# Patient Record
Sex: Male | Born: 1975 | Race: White | Hispanic: No | Marital: Married | Smoking: Never smoker
Health system: Southern US, Community
[De-identification: ages and names within clinical notes are randomized; demographics above are authoritative.]

## PROBLEM LIST (undated history)

## (undated) DIAGNOSIS — R51 Headache: Secondary | ICD-10-CM

## (undated) DIAGNOSIS — E669 Obesity, unspecified: Secondary | ICD-10-CM

## (undated) DIAGNOSIS — I10 Essential (primary) hypertension: Secondary | ICD-10-CM

## (undated) DIAGNOSIS — M6740 Ganglion, unspecified site: Secondary | ICD-10-CM

## (undated) DIAGNOSIS — F419 Anxiety disorder, unspecified: Secondary | ICD-10-CM

## (undated) DIAGNOSIS — K819 Cholecystitis, unspecified: Secondary | ICD-10-CM

## (undated) DIAGNOSIS — F329 Major depressive disorder, single episode, unspecified: Secondary | ICD-10-CM

## (undated) DIAGNOSIS — F32A Depression, unspecified: Secondary | ICD-10-CM

## (undated) DIAGNOSIS — E119 Type 2 diabetes mellitus without complications: Secondary | ICD-10-CM

## (undated) DIAGNOSIS — K760 Fatty (change of) liver, not elsewhere classified: Secondary | ICD-10-CM

## (undated) HISTORY — DX: Fatty (change of) liver, not elsewhere classified: K76.0

## (undated) HISTORY — DX: Anxiety disorder, unspecified: F41.9

## (undated) HISTORY — PX: VSD REPAIR: SHX276

## (undated) HISTORY — DX: Ganglion, unspecified site: M67.40

## (undated) HISTORY — DX: Essential (primary) hypertension: I10

## (undated) HISTORY — DX: Depression, unspecified: F32.A

## (undated) HISTORY — DX: Major depressive disorder, single episode, unspecified: F32.9

## (undated) HISTORY — PX: SPINE SURGERY: SHX786

## (undated) HISTORY — DX: Obesity, unspecified: E66.9

## (undated) HISTORY — PX: LUMBAR LAMINECTOMY: SHX95

## (undated) HISTORY — DX: Cholecystitis, unspecified: K81.9

## (undated) HISTORY — DX: Type 2 diabetes mellitus without complications: E11.9

---

## 2007-07-29 ENCOUNTER — Ambulatory Visit: Payer: Self-pay | Admitting: Internal Medicine

## 2007-07-29 DIAGNOSIS — M6740 Ganglion, unspecified site: Secondary | ICD-10-CM

## 2007-07-29 HISTORY — DX: Ganglion, unspecified site: M67.40

## 2007-12-24 ENCOUNTER — Ambulatory Visit: Payer: Self-pay | Admitting: Internal Medicine

## 2007-12-24 DIAGNOSIS — E669 Obesity, unspecified: Secondary | ICD-10-CM

## 2007-12-24 DIAGNOSIS — E119 Type 2 diabetes mellitus without complications: Secondary | ICD-10-CM

## 2007-12-24 HISTORY — DX: Obesity, unspecified: E66.9

## 2007-12-24 HISTORY — DX: Type 2 diabetes mellitus without complications: E11.9

## 2007-12-24 LAB — CONVERTED CEMR LAB
ALT: 46 units/L (ref 0–53)
AST: 22 units/L (ref 0–37)
Albumin: 3.4 g/dL — ABNORMAL LOW (ref 3.5–5.2)
Alkaline Phosphatase: 103 units/L (ref 39–117)
BUN: 12 mg/dL (ref 6–23)
Basophils Absolute: 0.1 10*3/uL (ref 0.0–0.1)
Basophils Relative: 1 % (ref 0.0–1.0)
Bilirubin, Direct: 0.1 mg/dL (ref 0.0–0.3)
Blood Glucose, Fingerstick: 239
CO2: 30 meq/L (ref 19–32)
Calcium: 9.9 mg/dL (ref 8.4–10.5)
Chloride: 101 meq/L (ref 96–112)
Creatinine, Ser: 0.9 mg/dL (ref 0.4–1.5)
Eosinophils Absolute: 0.2 10*3/uL (ref 0.0–0.7)
Eosinophils Relative: 2.1 % (ref 0.0–5.0)
GFR calc Af Amer: 124 mL/min
GFR calc non Af Amer: 103 mL/min
Glucose, Bld: 291 mg/dL — ABNORMAL HIGH (ref 70–99)
HCT: 49.5 % (ref 39.0–52.0)
Hemoglobin: 16.6 g/dL (ref 13.0–17.0)
Hgb A1c MFr Bld: 11.2 % — ABNORMAL HIGH (ref 4.6–6.0)
Lymphocytes Relative: 26.8 % (ref 12.0–46.0)
MCHC: 33.5 g/dL (ref 30.0–36.0)
MCV: 84.9 fL (ref 78.0–100.0)
Monocytes Absolute: 0.6 10*3/uL (ref 0.1–1.0)
Monocytes Relative: 6.6 % (ref 3.0–12.0)
Neutro Abs: 5.3 10*3/uL (ref 1.4–7.7)
Neutrophils Relative %: 63.5 % (ref 43.0–77.0)
Platelets: 251 10*3/uL (ref 150–400)
Potassium: 4.6 meq/L (ref 3.5–5.1)
RBC: 5.83 M/uL — ABNORMAL HIGH (ref 4.22–5.81)
RDW: 11.8 % (ref 11.5–14.6)
Sodium: 138 meq/L (ref 135–145)
TSH: 1.94 microintl units/mL (ref 0.35–5.50)
Total Bilirubin: 1 mg/dL (ref 0.3–1.2)
Total Protein: 6.9 g/dL (ref 6.0–8.3)
WBC: 8.5 10*3/uL (ref 4.5–10.5)

## 2008-01-07 ENCOUNTER — Ambulatory Visit: Payer: Self-pay | Admitting: Internal Medicine

## 2009-04-19 ENCOUNTER — Telehealth: Payer: Self-pay | Admitting: Internal Medicine

## 2009-07-12 ENCOUNTER — Ambulatory Visit: Payer: Self-pay | Admitting: Internal Medicine

## 2009-07-12 LAB — CONVERTED CEMR LAB
ALT: 27 units/L (ref 0–53)
AST: 19 units/L (ref 0–37)
Albumin: 3.8 g/dL (ref 3.5–5.2)
Alkaline Phosphatase: 71 units/L (ref 39–117)
BUN: 13 mg/dL (ref 6–23)
Basophils Absolute: 0.1 10*3/uL (ref 0.0–0.1)
Basophils Relative: 0.9 % (ref 0.0–3.0)
Bilirubin Urine: NEGATIVE
Bilirubin, Direct: 0.1 mg/dL (ref 0.0–0.3)
CO2: 32 meq/L (ref 19–32)
Calcium: 9 mg/dL (ref 8.4–10.5)
Chloride: 102 meq/L (ref 96–112)
Cholesterol: 203 mg/dL — ABNORMAL HIGH (ref 0–200)
Creatinine, Ser: 0.9 mg/dL (ref 0.4–1.5)
Direct LDL: 140.6 mg/dL
Eosinophils Absolute: 0.2 10*3/uL (ref 0.0–0.7)
Eosinophils Relative: 2.9 % (ref 0.0–5.0)
GFR calc non Af Amer: 101.7 mL/min (ref >60)
Glucose, Bld: 158 mg/dL — ABNORMAL HIGH (ref 70–99)
HCT: 45.1 % (ref 39.0–52.0)
HDL: 25 mg/dL — ABNORMAL LOW (ref >39.00)
Hemoglobin, Urine: NEGATIVE
Hemoglobin: 15.3 g/dL (ref 13.0–17.0)
Ketones, ur: NEGATIVE mg/dL
Leukocytes, UA: NEGATIVE
Lymphocytes Relative: 24.1 % (ref 12.0–46.0)
Lymphs Abs: 2 10*3/uL (ref 0.7–4.0)
MCHC: 33.9 g/dL (ref 30.0–36.0)
MCV: 87.5 fL (ref 78.0–100.0)
Monocytes Absolute: 0.6 10*3/uL (ref 0.1–1.0)
Monocytes Relative: 7.3 % (ref 3.0–12.0)
Neutro Abs: 5.4 10*3/uL (ref 1.4–7.7)
Neutrophils Relative %: 64.8 % (ref 43.0–77.0)
Nitrite: NEGATIVE
Platelets: 255 10*3/uL (ref 150.0–400.0)
Potassium: 4.4 meq/L (ref 3.5–5.1)
RBC: 5.15 M/uL (ref 4.22–5.81)
RDW: 12.3 % (ref 11.5–14.6)
Sodium: 141 meq/L (ref 135–145)
Specific Gravity, Urine: 1.02 (ref 1.000–1.030)
TSH: 2.18 microintl units/mL (ref 0.35–5.50)
Total Bilirubin: 0.7 mg/dL (ref 0.3–1.2)
Total CHOL/HDL Ratio: 8
Total Protein, Urine: NEGATIVE mg/dL
Total Protein: 7.1 g/dL (ref 6.0–8.3)
Triglycerides: 192 mg/dL — ABNORMAL HIGH (ref 0.0–149.0)
Urine Glucose: NEGATIVE mg/dL
Urobilinogen, UA: 0.2 (ref 0.0–1.0)
VLDL: 38.4 mg/dL (ref 0.0–40.0)
WBC: 8.3 10*3/uL (ref 4.5–10.5)
pH: 7 (ref 5.0–8.0)

## 2009-07-21 ENCOUNTER — Ambulatory Visit: Payer: Self-pay | Admitting: Internal Medicine

## 2009-07-21 LAB — CONVERTED CEMR LAB: Hgb A1c MFr Bld: 7.3 % — ABNORMAL HIGH (ref 4.6–6.5)

## 2009-07-21 LAB — HM DIABETES FOOT EXAM

## 2009-10-12 ENCOUNTER — Emergency Department (HOSPITAL_COMMUNITY): Admission: EM | Admit: 2009-10-12 | Discharge: 2009-10-12 | Payer: Self-pay | Admitting: Emergency Medicine

## 2010-09-21 ENCOUNTER — Ambulatory Visit: Payer: Self-pay | Admitting: Internal Medicine

## 2010-09-21 LAB — HM DIABETES EYE EXAM: HM Diabetic Eye Exam: NORMAL

## 2010-10-24 NOTE — Letter (Signed)
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   [place], [place] [address]   Phone: [phone]  Fax: [phone]    December 24, 2007   Employee:  Donia Guiles    To Whom It May Concern:   For Medical reasons, please excuse the above named employee from work for the following dates:  Start:   4-1  End:   4-1  If you need additional information, please feel free to contact our office. Mr. Porreca may return to work without restrictions.         Sincerely,    Gordy Savers  MD

## 2010-10-24 NOTE — Assessment & Plan Note (Signed)
Summary: CPX/CJR   Vital Signs:  Patient profile:   35 year old male Weight:      240 pounds BMI:     36.62 Pulse rate:   84 / minute Pulse rhythm:   regular BP sitting:   112 / 80  (left arm) Cuff size:   large  Vitals Entered By: Raechel Ache, RN (July 21, 2009 2:31 PM) CC: CPX, labs done. Is Patient Diabetic? Yes   CC:  CPX and labs done.Derrick West  History of Present Illness: 35 year-old patient who has a history of type 2 diabetes, who is seen today for a wellness exam.  he has had prior open heart surgery as a child presumably for closure of a VSD.  He is doing well today.  He does monitor blood sugars at home with fairly normal readings.  He has not been seen here in follow-up in some time.  He states that he has annual exams at work, including EKG s  Preventive Screening-Counseling & Management  Alcohol-Tobacco     Smoking Status: never  Caffeine-Diet-Exercise     Does Patient Exercise: no  Problems Prior to Update: 1)  Obesity  (ICD-278.00) 2)  Diabetes Mellitus, Type II  (ICD-250.00) 3)  Ganglion Cyst, Wrist, Left  (ICD-727.41)  Medications Prior to Update: 1)  Aleve 220 Mg  Tabs (Naproxen Sodium) .... Prn 2)  Metformin Hcl 1000 Mg  Tabs (Metformin Hcl) .... One Twice Daily 3)  Freestyle Lite   Strp (Glucose Blood) .... Use Daily  Allergies: 1)  ! Amoxicillin (Amoxicillin)  Past History:  Past Medical History: patient had open-heart surgery in 1979  for unclear congenital heart disease (VSD?) Diabetes mellitus, type II   initial diagnosis of April 2009 Obesity  Past Surgical History: Lumbar laminectomy 2003 Open heart surgery for repair of VSD  Family History: Reviewed history from 07/29/2007 and no changes required. father age 17, history of coronary artery disease, hypertension mother, age 57, history of asthma Two brothers in good health Family History of Asthma Family History of CAD Male 1st degree relative <60 Family History Hypertension   Social History: Reviewed history and no changes required. Married one daughter 52 months Never Smoked [employer] rotating shifts Regular exercise-no Smoking Status:  never Does Patient Exercise:  no  Review of Systems  The patient denies anorexia, fever, weight loss, weight gain, vision loss, decreased hearing, hoarseness, chest pain, syncope, dyspnea on exertion, peripheral edema, prolonged cough, headaches, hemoptysis, abdominal pain, melena, hematochezia, severe indigestion/heartburn, hematuria, incontinence, genital sores, muscle weakness, suspicious skin lesions, transient blindness, difficulty walking, depression, unusual weight change, abnormal bleeding, enlarged lymph nodes, angioedema, breast masses, and testicular masses.    Physical Exam  General:  overweight-appearing.  normal blood pressureoverweight-appearing.   Head:  Normocephalic and atraumatic without obvious abnormalities. No apparent alopecia or balding. Eyes:  No corneal or conjunctival inflammation noted. EOMI. Perrla. Funduscopic exam benign, without hemorrhages, exudates or papilledema. Vision grossly normal. Ears:  External ear exam shows no significant lesions or deformities.  Otoscopic examination reveals clear canals, tympanic membranes are intact bilaterally without bulging, retraction, inflammation or discharge. Hearing is grossly normal bilaterally. Mouth:  Oral mucosa and oropharynx without lesions or exudates.  Teeth in good repair. Neck:  No deformities, masses, or tenderness noted. Chest Wall:  No deformities, masses, tenderness or gynecomastia noted. lower transverse thoracotomy scar Lungs:  Normal respiratory effort, chest expands symmetrically. Lungs are clear to auscultation, no crackles or wheezes. Heart:  Normal rate and regular rhythm.  S1 and S2 normal without gallop, murmur, click, rub or other extra sounds. Abdomen:  Bowel sounds positive,abdomen soft and non-tender without masses, organomegaly or  hernias noted. Genitalia:  Testes bilaterally descended without nodularity, tenderness or masses. No scrotal masses or lesions. No penis lesions or urethral discharge. Msk:  No deformity or scoliosis noted of thoracic or lumbar spine.   Pulses:  R and L carotid,radial,femoral,dorsalis pedis and posterior tibial pulses are full and equal bilaterally  Diabetes Management Exam:    Foot Exam (with socks and/or shoes not present):       Sensory-Pinprick/Light touch:          Left medial foot (L-4): normal          Left dorsal foot (L-5): normal          Left lateral foot (S-1): normal          Right medial foot (L-4): normal          Right dorsal foot (L-5): normal          Right lateral foot (S-1): normal       Sensory-Monofilament:          Left foot: normal          Right foot: normal       Inspection:          Left foot: normal          Right foot: normal       Nails:          Left foot: normal          Right foot: normal    Foot Exam by Podiatrist:       Date: 07/21/2009       Results: no diabetic findings       Done by: PCP    Eye Exam:       Eye Exam done here today          Results: normal   Impression & Recommendations:  Problem # 1:  Preventive Health Care (ICD-V70.0)  Complete Medication List: 1)  Aleve 220 Mg Tabs (Naproxen sodium) .... Prn 2)  Metformin Hcl 1000 Mg Tabs (Metformin hcl) .... One twice daily 3)  Freestyle Lite Strp (Glucose blood) .... Use daily  Other Orders: Venipuncture ([address]) TLB-A1C / Hgb A1C (Glycohemoglobin) (83036-A1C)  Patient Instructions: 1)  Please schedule a follow-up appointment in 3 months. 2)  It is important that you exercise regularly at least 20 minutes 5 times a week. If you develop chest pain, have severe difficulty breathing, or feel very tired , stop exercising immediately and seek medical attention. 3)  You need to lose weight. Consider a lower calorie diet and regular exercise.  4)  Check your blood sugars regularly. If  your readings are usually above : or below 70 you should contact our office. 5)  It is important that your Diabetic A1c level is checked every 3 months. 6)  See your eye doctor yearly to check for diabetic eye damage. Prescriptions: FREESTYLE LITE   STRP (GLUCOSE BLOOD) use daily  #180 Each x 5   Entered and Authorized by:   Gordy Savers  MD   Signed by:   Gordy Savers  MD on 07/21/2009   Method used:   Print then Give to Patient   RxID:   [id number] METFORMIN HCL 1000 MG  TABS (METFORMIN HCL) one twice daily  #180 Each x 4   Entered and Authorized by:  Gordy Savers  MD   Signed by:   Gordy Savers  MD on 07/21/2009   Method used:   Print then Give to Patient   RxID:   [id number]

## 2010-10-26 NOTE — Assessment & Plan Note (Signed)
Summary: med check/refill/cjr   Vital Signs:  Patient profile:   35 year old male Weight:      246 pounds Temp:     98.0 degrees F oral BP sitting:   122 / 84  (right arm) Cuff size:   regular  Vitals Entered By: Duard Brady LPN (September 21, 2010 8:27 AM) CC: med review with refill     fbs Is Patient Diabetic? Yes Did you bring your meter with you today? No   CC:  med review with refill     fbs.  History of Present Illness: 35 year old patient who has a history of type 2 diabetes.  He has a history also of exogenous obesity.  He has not been seen here in 14 months.  Over this period time, there has been  a 6-pound weight gain.  For the past 6 months.  Blood sugars have been consistently running over 200.  He feels well without hyperglycemic symptoms.  He has relocated to [place].  He states that he takes his medications inconsistently.  He has relocated to [place].  Allergies: 1)  ! Amoxicillin (Amoxicillin)  Past History:  Past Medical History: Reviewed history from 07/21/2009 and no changes required. patient had open-heart surgery in 1979  for unclear congenital heart disease (VSD?) Diabetes mellitus, type II   initial diagnosis of April 2009 Obesity  Family History: Reviewed history from 07/21/2009 and no changes required. father age 35, history of coronary artery disease, hypertension mother, age 25, history of asthma Two brothers in good health Family History of Asthma Family History of CAD Male 1st degree relative <60 Family History Hypertension  Social History: Reviewed history from 07/21/2009 and no changes required. Married one daughter 73 months Never Smoked [employer] rotating shifts Regular exercise-no  Review of Systems       The patient complains of weight gain.  The patient denies anorexia, fever, weight loss, vision loss, decreased hearing, hoarseness, chest pain, syncope, dyspnea on exertion, peripheral edema, prolonged cough, headaches,  hemoptysis, abdominal pain, melena, hematochezia, severe indigestion/heartburn, hematuria, incontinence, genital sores, muscle weakness, suspicious skin lesions, transient blindness, difficulty walking, depression, unusual weight change, abnormal bleeding, enlarged lymph nodes, angioedema, breast masses, and testicular masses.    Physical Exam  General:  overweight-appearing.  130/90overweight-appearing.   Head:  Normocephalic and atraumatic without obvious abnormalities. No apparent alopecia or balding. Eyes:  No corneal or conjunctival inflammation noted. EOMI. Perrla. Funduscopic exam benign, without hemorrhages, exudates or papilledema. Vision grossly normal. Mouth:  Oral mucosa and oropharynx without lesions or exudates.  Teeth in good repair. Neck:  No deformities, masses, or tenderness noted. Lungs:  Normal respiratory effort, chest expands symmetrically. Lungs are clear to auscultation, no crackles or wheezes. Heart:  Normal rate and regular rhythm. S1 and S2 normal without gallop, murmur, click, rub or other extra sounds. Abdomen:  Bowel sounds positive,abdomen soft and non-tender without masses, organomegaly or hernias noted. Msk:  No deformity or scoliosis noted of thoracic or lumbar spine.   Pulses:  R and L carotid,radial,femoral,dorsalis pedis and posterior tibial pulses are full and equal bilaterally Extremities:  No clubbing, cyanosis, edema, or deformity noted with normal full range of motion of all joints.   Skin:  Intact without suspicious lesions or rashes  Diabetes Management Exam:    Eye Exam:       Eye Exam done here today          Results: normal   Impression & Recommendations:  Problem # 1:  DIABETES  MELLITUS, TYPE II (ICD-250.00)  His updated medication list for this problem includes:    Metformin Hcl 1000 Mg Tabs (Metformin hcl) ..... One twice daily    Glimepiride 4 Mg Tabs (Glimepiride) .Marland Kitchen... 1/2 tablet daily  His updated medication list for this problem  includes:    Metformin Hcl 1000 Mg Tabs (Metformin hcl) ..... One twice daily    Glimepiride 4 Mg Tabs (Glimepiride) .Marland Kitchen... 1/2 tablet daily  Problem # 2:  OBESITY (ICD-278.00)  Complete Medication List: 1)  Aleve 220 Mg Tabs (Naproxen sodium) .... Prn 2)  Metformin Hcl 1000 Mg Tabs (Metformin hcl) .... One twice daily 3)  Freestyle Lite Strp (Glucose blood) .... Use daily 4)  Glimepiride 4 Mg Tabs (Glimepiride) .... 1/2 tablet daily  Patient Instructions: 1)  Please schedule a follow-up appointment in 2 months. 2)  Limit your Sodium (Salt). 3)  It is important that you exercise regularly at least 20 minutes 5 times a week. If you develop chest pain, have severe difficulty breathing, or feel very tired , stop exercising immediately and seek medical attention. 4)  You need to lose weight. Consider a lower calorie diet and regular exercise.  5)  Check your blood sugars regularly. If your readings are usually above : or below 70 you should contact our office. 6)  It is important that your Diabetic A1c level is checked every 3 months. 7)  See your eye doctor yearly to check for diabetic eye damage. Prescriptions: GLIMEPIRIDE 4 MG TABS (GLIMEPIRIDE) 1/2 tablet daily  #90 x 6   Entered and Authorized by:   Gordy Savers  MD   Signed by:   Gordy Savers  MD on 09/21/2010   Method used:   Electronically to        [facility]  [address] (retail)       [address]       [place], [place]  [address]       Ph: [phone]       Fax: [phone]   RxID:   [id number] FREESTYLE LITE   STRP (GLUCOSE BLOOD) use daily  #180 Each x 5   Entered and Authorized by:   Gordy Savers  MD   Signed by:   Gordy Savers  MD on 09/21/2010   Method used:   Electronically to        [facility]  [address] (retail)       [address]       [place], [place]  [address]       Ph: [phone]       Fax: [phone]   RxID:   [id number] METFORMIN  HCL 1000 MG  TABS (METFORMIN HCL) one twice daily  #180 Each x 4   Entered and Authorized by:   Gordy Savers  MD   Signed by:   Gordy Savers  MD on 09/21/2010   Method used:   Electronically to        [facility]  [address] (retail)       [address]       [place], [place]  [address]       Ph: [phone]       Fax: [phone]   RxID:   [id number]    Orders Added: 1)  Est. Patient Level IV [[address]]

## 2010-11-15 ENCOUNTER — Encounter: Payer: Self-pay | Admitting: Internal Medicine

## 2010-11-16 ENCOUNTER — Ambulatory Visit (INDEPENDENT_AMBULATORY_CARE_PROVIDER_SITE_OTHER): Payer: 59 | Admitting: Internal Medicine

## 2010-11-16 ENCOUNTER — Encounter: Payer: Self-pay | Admitting: Internal Medicine

## 2010-11-16 VITALS — BP 120/80 | Temp 98.2°F | Ht 68.5 in | Wt 257.0 lb

## 2010-11-16 DIAGNOSIS — E119 Type 2 diabetes mellitus without complications: Secondary | ICD-10-CM

## 2010-11-16 LAB — HEMOGLOBIN A1C: Hgb A1c MFr Bld: 8.1 % — ABNORMAL HIGH (ref 4.6–6.5)

## 2010-11-16 NOTE — Progress Notes (Signed)
  Subjective:    Patient ID: Derrick West, male    DOB: 1976-09-20, 35 y.o.   MRN: [id number]  HPI   35 year old patient who is in today for followup of his type 2 diabetes. He states his fasting blood sugars are fairly well-controlled is a less than 120. Blood sugars are also well-maintained throughout the day and the often less than 100 in the early afternoon following lunch. He notes some high blood sugars between his evening meal and bedtime often as high as 250. He feels well. He has been unsuccessful at weight loss.   Review of Systems  Constitutional: Negative for fever, chills, appetite change and fatigue.  HENT: Negative for hearing loss, ear pain, congestion, sore throat, trouble swallowing, neck stiffness, dental problem, voice change and tinnitus.   Eyes: Negative for pain, discharge and visual disturbance.  Respiratory: Negative for cough, chest tightness, wheezing and stridor.   Cardiovascular: Negative for chest pain, palpitations and leg swelling.  Gastrointestinal: Negative for nausea, vomiting, abdominal pain, diarrhea, constipation, blood in stool and abdominal distention.  Genitourinary: Negative for urgency, hematuria, flank pain, discharge, difficulty urinating and genital sores.  Musculoskeletal: Negative for myalgias, back pain, joint swelling, arthralgias and gait problem.  Skin: Negative for rash.  Neurological: Negative for dizziness, syncope, speech difficulty, weakness, numbness and headaches.  Hematological: Negative for adenopathy. Does not bruise/bleed easily.  Psychiatric/Behavioral: Negative for behavioral problems and dysphoric mood. The patient is not nervous/anxious.        Objective:   Physical Exam  Constitutional: He is oriented to person, place, and time. He appears well-developed.  HENT:  Head: Normocephalic.  Right Ear: External ear normal.  Left Ear: External ear normal.  Eyes: Conjunctivae and EOM are normal.  Neck: Normal range of motion.    Cardiovascular: Normal rate and normal heart sounds.   Pulmonary/Chest: Breath sounds normal.  Abdominal: Bowel sounds are normal.  Musculoskeletal: Normal range of motion. He exhibits no edema and no tenderness.  Neurological: He is alert and oriented to person, place, and time.  Psychiatric: He has a normal mood and affect. His behavior is normal.          Assessment & Plan:   diabetes mellitus type 2. Nonpharmacologic lifestyle issues discussed at length. Dr. Information dispensed. Will check a hemoglobin A1c today

## 2010-11-16 NOTE — Patient Instructions (Addendum)
It is important that you exercise regularly, at least 20 minutes 3 to 4 times per week.  If you develop chest pain or shortness of breath seek  medical attention.  Please check your hemoglobin A1c every 3 months  You need to lose weight.  Consider a lower calorie diet and regular exercise. Diabetes and Exercise   Regular exercise is important and can help:    Control blood glucose (sugar).    Decrease blood pressure.    Control blood lipids (cholesterol and triglycerides).  Improve overall health.   BENEFITS FROM EXERCISE:  Improved fitness.  Improved flexibility.  Improved endurance.  Increased bone density.  Weight control.  Increased muscle strength.  Decreased body fat.  Improvement of the body's use of a hormone called insulin. l Increased insulin sensitivity. l Reduction of insulin needs.  Helps you feel better.  Reduces stress and tension.   People with diabetes who add exercise to their lifestyle gain additional benefits.    Weight loss.  Reduces appetite.  Improves body's use of blood glucose (sugar).  Decreases risk factors for heart disease: l Lowering of cholesterol and triglycerides. l Raising the level of good cholesterol (high-density lipoproteins [HDL]). l Lowering blood sugar.  Decreases blood pressure.   TYPE 1 DIABETES AND EXERCISE  Exercise will usually lower your blood glucose.  If blood glucose is greater than 240 mg/dl, check urine ketones. If ketones are present, do not exercise.  Location of the insulin injection sites may need to be adjusted with exercise. Avoid injecting insulin into areas of the body that will be exercised. For example, avoid injecting insulin into: l The arms when playing tennis. l The legs when jogging. For more information, discuss this with your caregiver.  Keep a record of: l Food intake. l Type and amount of exercise. l Expected peak times of insulin action.   l Blood glucose (sugar) levels. Do  this before, during and after exercise. Review your records with your caregiver(s). This will help you to develop guidelines for adjusting food intake and/or insulin amounts.     TYPE 2 DIABETES AND EXERCISE  Regular physical activity can help control blood glucose.  Exercise is important because it may: l Increase the body's sensitivity to insulin. l Improve blood glucose control.  Exercise reduces the risk of heart disease. It decreases serum cholesterol and triglycerides. It also lowers blood pressure.  Those who take insulin or oral hypoglycemic agents should watch for signs of hypoglycemia. These signs include dizziness, shaking, sweating, chills and confusion.  Body water is lost during exercise. It must be replaced. This will help to avoid loss of body fluids (dehydration) and/or heat stroke.   Be sure to talk to your caregiver before starting an exercise program to make sure it is safe for you. Remember, any activity is better than none.     Document Released: 12/01/2003  Document Re-Released: 07/08/2009 [employer] Patient Information 2011 [place], [place].Diabetic Meal Planning Guide   The diabetic meal planning guide is a tool to help you plan your meals and snacks. It is important for people with diabetes to manage their blood sugar levels. Choosing the right foods and the right amounts throughout your day will help control your blood sugar. Eating right can even help you improve your blood pressure and reach or maintain a healthy weight.   CARBOHYDRATE COUNTING MADE EASY When you eat carbohydrate, it turns to sugar (glucose). This in turn raises your blood sugar level. Counting carbohydrates can help you  control this level so you feel better. When you plan your meals by counting carbohydrates, you can have more flexibility in what you eat and balance your medication with your food intake.   Carbohydrate counting simply means adding up the total amount of carbohydrate grams in your  meals or snacks. Try to eat about the same amount at each meal. Each portion below is one carb choice or 15 grams of carbohydrate. Foods with carbohydrates are listed below. Ask your dietician how many grams of carbohydrate you should eat at each meal or snack.   Grains and Starches       1 slice bread                   English muffin or hotdog/hamburger bun          cup cold cereal (unsweetened)    1/3 cup cooked pasta or rice 1/2 cup starchy vegetables (corn, potatoes, peas, beans, winter squash) 1 tortilla (6 inches)  bagel 1 waffle or pancake (size of a compact disk)  cup cooked cereal 4-6 small crackers   Fruit   1 cup fresh unsweetened berries, melon, papaya, pineapple         1 small fresh fruit 1/2 banana or mango 1/2 cup fruit juice (4 oz unsweetened) 1/2 cup canned fruit in natural juice or water 2 Tbsp dried fruit 12-15 grapes or cherries   Milk and Yogurt      1 cup fat free or 1 % milk 1 cup soy milk 6 oz light yogurt with sugar free sweetener 6 oz low fat soy yogurt 6 oz plain yogurt   Vegetables 1 cup raw or 1/2 cup cooked is counted as zero carbs or a "free" food If you eat 3 or more servings at one meal, count them as 1 carbohydrate serving.   Other Carbohydrate 3/4 oz chips or pretzels 1/2 cup ice cream or frozen yogurt  cup sherbet or sorbet 2 inch square cake, no frosting 1 Tbsp honey, sugar, jam, jelly or syrup 2 small cookies 3 squares of graham crackers 3 cups popcorn 6 crackers 1 cup broth based soup Count 1 cup casserole or other mixed foods as 2 carbohydrate servings. Foods with less than 20 calories in a serving may be counted as zero carbohydrate or a "free" food.   You may want to purchase a book or computer software that lists the carbohydrate gram counts of different foods.  In addition, the Nutrition Facts panel on the labels of the foods you eat are a good source of this information. It will tell you how big the serving size is and  the total number of carbohydrate grams you will be eating per serving. Divide this number by 15 to obtain the number of carbohydrate servings in a portion. Remember:1 carbohydrate serving equals 15 grams of carbohydrate.     SERVING SIZES Measuring foods and serving sizes helps to make sure you are getting the right amount of food. The list below tells how big or small some common serving sizes are.    1 ounce (oz) of cheese.................................4 stacked dice.  2-3 oz cooked meat.....................................Marland KitchenDeck of cards.  1 teaspoon (tsp)...........................................Marland KitchenTip of little finger.  1 tablespoon (Tbsp)....................................Marland KitchenMarland KitchenThumb.  2 Tbsp..........................................................Marland KitchenGolf ball.   Cup..........................................................Marland KitchenHalf of a fist.  1 Cup...........................................................Marland KitchenA fist.   SAMPLE DIABETIC MEAL PLAN: Below is a sample meal plan that includes foods from the grain and starches, dairy, vegetable, fruit, and meat groups. A Registered Dietician can individualize a meal  plan to fit your calorie needs and tell you the number of servings needed from each food group. However, you may interchange carbohydrate containing foods (dairy, starches, and fruits). Controlling the total amount of carbohydrate in your meal or snack is more important than making sure you include all of the food groups at every meal.     This meal plan below is an example of a 2000 calorie diet using carbohydrate counting. This meal plan has 17 carb choices or servings. Breakfast  1 cup oatmeal (2 carb choices)   cup light yogurt (1 carb choice) 1 cup blueberries (1 carb choice)  cup almonds   Snack  1 large apple (2 carb choices)  1 low fat string cheese stick   Lunch  Chicken breast salad  l 1 cup spinach l  cup chopped tomatoes l 2 oz chicken breast, sliced l 2 Tbsp low  fat [place] dressing 12 whole wheat crackers (2 carb choices) 12-15 grapes (1 carb choice) 1 cup low fat milk (1 carb choice)   Snack  1 cup carrots   cup hummus (1 carb choice)   Dinner   3 oz broiled salmon  1 cup brown rice (3 carb choices)   Snack   1  cups steamed broccoli (1 carb choice) drizzled with 1 tsp olive oil and lemon juice  1 cup light pudding (2 carb choices)      DIABETIC MEAL PLANNING WORKSHEET: Your dietician can use this worksheet to help you decide how many servings of foods and what types of foods are right for you.     Breakfast Food Group and Servings               Carb Choices Grain/Starches ________________________________________ Dairy ________________________________________________ Vegetables _______________________________________ Fruits ________________________________________________ Meat ________________________________________________ Fat _____________________________________________   Lunch Food Group and Servings               Carb Choices Grain/Starches ________________________________________ Dairy _______________________________________________ Fruits _______________________________________________ Meat ________________________________________________ Fat _____________________________________________   Dinner Food Group and Servings               Carb Choices Grain/Starches ________________________________________ Dairy ________________________________________________ Fruits _______________________________________________ Meat ________________________________________________ Fat _____________________________________________   Snacks Food Group and Servings               Carb Choices Grain/Starches _________________________________________ Dairy ________________________________________________ Vegetables ________________________________________ Fruits ________________________________________________ Meat  _________________________________________________ Fat ______________________________________________   Daily Totals Starches _________________________  Vegetables _________________________ Fruits _____________________________ Dairy _____________________________ Meat ______________________________ Fats _______________________________       Document Released: 06/07/2005  Document Re-Released: 10/02/2009 [employer] Patient Information 2011 [place], [place].Diabetes, Type 2   Diabetes is a lasting (chronic) disease. It occurs when the body does not properly use the sugar (glucose) that is released from food. Glucose levels are controlled by a hormone called insulin. Insulin is made by your pancreas. In type 2 diabetes, the pancreas is making less insulin and the body has trouble using the insulin properly.   HOME CARE   Check your blood glucose (sugar) once a day.    Follow your treatment and monitoring plan.  Take your medicine as directed by your doctor.    Do not smoke.    Eat healthy. Weight loss can improve your diabetes.    Know what low blood glucose (hypoglycemia) is. Know how to treat it.    Get your eyes checked regularly.  Have a yearly physical exam.    Have your blood pressure checked.    Get your blood and  urine tested.    Wear a bracelet that says you have diabetes.  Check your feet for sores every night. Tell your doctor if sores are not healing.   GET HELP RIGHT AWAY IF YOU:    Have trouble keeping your blood glucose in target range.  Have problems with your medicines.  Are sick and not getting better after 24 hours.  Have a sore or wound that is not healing.  Have a problem or change in your vision.  Develop fever of more than 100.5 F (38.1 C).   Document Released: 06/19/2008  Document Re-Released: 07/08/2009 [facility] Patient Information 2011 [place], [place].

## 2010-11-20 NOTE — Progress Notes (Signed)
Quick Note:  Spoke with pt - informed of lab results and doctor's instructions ______

## 2010-12-11 LAB — BASIC METABOLIC PANEL
BUN: 11 mg/dL (ref 6–23)
CO2: 25 mEq/L (ref 19–32)
Calcium: 9.1 mg/dL (ref 8.4–10.5)
Chloride: 101 mEq/L (ref 96–112)
Creatinine, Ser: 0.73 mg/dL (ref 0.4–1.5)
GFR calc Af Amer: >60 mL/min (ref >60)
GFR calc non Af Amer: >60 mL/min (ref >60)
Glucose, Bld: 211 mg/dL — ABNORMAL HIGH (ref 70–99)
Potassium: 3.8 mEq/L (ref 3.5–5.1)
Sodium: 137 mEq/L (ref 135–145)

## 2010-12-11 LAB — POCT CARDIAC MARKERS
CKMB, poc: 1.2 ng/mL (ref 1.0–8.0)
CKMB, poc: <1 ng/mL — ABNORMAL LOW (ref 1.0–8.0)
Myoglobin, poc: 59.3 ng/mL (ref 12–200)
Myoglobin, poc: 67.9 ng/mL (ref 12–200)
Troponin i, poc: <0.05 ng/mL (ref 0.00–0.09)
Troponin i, poc: <0.05 ng/mL (ref 0.00–0.09)

## 2010-12-11 LAB — DIFFERENTIAL
Basophils Absolute: 0 10*3/uL (ref 0.0–0.1)
Basophils Relative: 1 % (ref 0–1)
Eosinophils Absolute: 0.1 10*3/uL (ref 0.0–0.7)
Eosinophils Relative: 2 % (ref 0–5)
Lymphocytes Relative: 23 % (ref 12–46)
Lymphs Abs: 2 10*3/uL (ref 0.7–4.0)
Monocytes Absolute: 0.5 10*3/uL (ref 0.1–1.0)
Monocytes Relative: 6 % (ref 3–12)
Neutro Abs: 6.2 10*3/uL (ref 1.7–7.7)
Neutrophils Relative %: 69 % (ref 43–77)

## 2010-12-11 LAB — CBC
HCT: 45.4 % (ref 39.0–52.0)
Hemoglobin: 15.5 g/dL (ref 13.0–17.0)
MCHC: 34.2 g/dL (ref 30.0–36.0)
MCV: 86.5 fL (ref 78.0–100.0)
Platelets: 240 10*3/uL (ref 150–400)
RBC: 5.25 MIL/uL (ref 4.22–5.81)
RDW: 12.7 % (ref 11.5–15.5)
WBC: 8.9 10*3/uL (ref 4.0–10.5)

## 2010-12-30 ENCOUNTER — Inpatient Hospital Stay (INDEPENDENT_AMBULATORY_CARE_PROVIDER_SITE_OTHER)
Admission: RE | Admit: 2010-12-30 | Discharge: 2010-12-30 | Disposition: A | Discharge status: Home / Self Care | Payer: 59 | Source: Ambulatory Visit | Attending: Emergency Medicine | Admitting: Emergency Medicine

## 2010-12-30 DIAGNOSIS — J019 Acute sinusitis, unspecified: Secondary | ICD-10-CM

## 2010-12-30 DIAGNOSIS — J029 Acute pharyngitis, unspecified: Secondary | ICD-10-CM

## 2010-12-30 LAB — POCT RAPID STREP A (OFFICE): Streptococcus, Group A Screen (Direct): NEGATIVE

## 2011-10-06 ENCOUNTER — Other Ambulatory Visit: Payer: Self-pay | Admitting: Internal Medicine

## 2011-10-27 ENCOUNTER — Other Ambulatory Visit: Payer: Self-pay | Admitting: Internal Medicine

## 2011-10-30 ENCOUNTER — Other Ambulatory Visit: Payer: Self-pay | Admitting: *Deleted

## 2011-10-30 MED ORDER — GLIMEPIRIDE 4 MG PO TABS: ORAL_TABLET | ORAL | Status: DC

## 2011-11-29 ENCOUNTER — Other Ambulatory Visit: Payer: Self-pay | Admitting: Internal Medicine

## 2011-11-29 ENCOUNTER — Telehealth: Payer: Self-pay | Admitting: *Deleted

## 2011-11-29 MED ORDER — GLUCOSE BLOOD VI STRP: 1.0000 | ORAL_STRIP | Freq: Every day | Status: DC

## 2011-11-29 MED ORDER — GLUCOSE BLOOD VI STRP: ORAL_STRIP | Status: DC

## 2011-11-29 NOTE — Telephone Encounter (Signed)
Pharmacy called and stated that rx for test strips need to read "test up to twice daily".  Rx resent to pharmacy.

## 2011-11-29 NOTE — Telephone Encounter (Signed)
Attempt to call- pharmacy closed for lunch - doctor line - LMTCB if questions - I had sent rx earlier - qday

## 2011-11-29 NOTE — Telephone Encounter (Signed)
done

## 2011-11-29 NOTE — Telephone Encounter (Addendum)
Please call pharmacy re: questions about Free Style Strips.  How many times a  Day to use them?

## 2011-11-29 NOTE — Telephone Encounter (Signed)
Pt needs new rx freestyle lite test strips [facility] [phone]

## 2011-11-29 NOTE — Telephone Encounter (Signed)
Addended by: Azucena Freed on: 11/29/2011 05:04 PM   Modules accepted: Orders

## 2011-12-04 ENCOUNTER — Other Ambulatory Visit: Payer: Self-pay

## 2011-12-04 MED ORDER — METFORMIN HCL 1000 MG PO TABS: 1000.0000 mg | ORAL_TABLET | Freq: Two times a day (BID) | ORAL | Status: DC

## 2011-12-25 ENCOUNTER — Encounter: Payer: Self-pay | Admitting: Internal Medicine

## 2011-12-25 ENCOUNTER — Ambulatory Visit (INDEPENDENT_AMBULATORY_CARE_PROVIDER_SITE_OTHER): Payer: 59 | Admitting: Internal Medicine

## 2011-12-25 ENCOUNTER — Telehealth: Payer: Self-pay | Admitting: Internal Medicine

## 2011-12-25 DIAGNOSIS — E119 Type 2 diabetes mellitus without complications: Secondary | ICD-10-CM

## 2011-12-25 DIAGNOSIS — E669 Obesity, unspecified: Secondary | ICD-10-CM

## 2011-12-25 LAB — GLUCOSE, POCT (MANUAL RESULT ENTRY): POC Glucose: 122

## 2011-12-25 LAB — HM DIABETES FOOT EXAM

## 2011-12-25 MED ORDER — GLIMEPIRIDE 4 MG PO TABS: ORAL_TABLET | ORAL | Status: DC

## 2011-12-25 MED ORDER — METFORMIN HCL 1000 MG PO TABS: 1000.0000 mg | ORAL_TABLET | Freq: Two times a day (BID) | ORAL | Status: DC

## 2011-12-25 MED ORDER — GLIMEPIRIDE 4 MG PO TABS: 4.0000 mg | ORAL_TABLET | Freq: Every day | ORAL | Status: DC

## 2011-12-25 NOTE — Patient Instructions (Signed)
Please check your hemoglobin A1c every 3 months  Call or return to clinic prn if these symptoms worsen or fail to improve as anticipated.  

## 2011-12-25 NOTE — Telephone Encounter (Signed)
Pt has a large blister on bottom on foot that is very painful. Hard to walk. Pt is req to get a work in [employer] today with pcp. Pt said that he can not come in on another day.

## 2011-12-25 NOTE — Progress Notes (Signed)
  Subjective:    Patient ID: Derrick West, male    DOB: 1975-12-25, 36 y.o.   MRN: [id number]  HPI 36 year old patient who has been followed here for several years but is seen very infrequently. His last visit was over one year ago. He has type 2 diabetes which has been treated with oral medications. Today he presents with a chief complaint of a blister involving the plantar aspects of his left foot over the metatarsal heads. This occurred while after prolonged walking. He has no history of diabetic peripheral neuropathy. Diabetic regimen includes Amaryl 2 mg daily as well as metformin 1000 mg twice daily.  He also requests FMLA form completion  Wt Readings from Last 3 Encounters:  11/16/10 257 lb (116.574 kg)  09/21/10 246 lb (111.585 kg)  07/21/09 240 lb (108.863 kg)     Review of Systems  Constitutional: Negative for fever, chills, appetite change and fatigue.  HENT: Negative for hearing loss, ear pain, congestion, sore throat, trouble swallowing, neck stiffness, dental problem, voice change and tinnitus.   Eyes: Negative for pain, discharge and visual disturbance.  Respiratory: Negative for cough, chest tightness, wheezing and stridor.   Cardiovascular: Negative for chest pain, palpitations and leg swelling.  Gastrointestinal: Negative for nausea, vomiting, abdominal pain, diarrhea, constipation, blood in stool and abdominal distention.  Genitourinary: Negative for urgency, hematuria, flank pain, discharge, difficulty urinating and genital sores.  Musculoskeletal: Positive for gait problem. Negative for myalgias, back pain, joint swelling and arthralgias.  Skin: Positive for wound (painful blister plantar surface left foot). Negative for rash.  Neurological: Negative for dizziness, syncope, speech difficulty, weakness, numbness and headaches.  Hematological: Negative for adenopathy. Does not bruise/bleed easily.  Psychiatric/Behavioral: Negative for behavioral problems and dysphoric  mood. The patient is not nervous/anxious.        Objective:   Physical Exam  Constitutional: He appears well-developed and well-nourished. No distress.       Blood pressure 122/84 Obese Weight 249  Cardiovascular: Intact distal pulses.   Neurological:       Foot exam intact to vibratory sensation soft touch and proprioception  Skin:       A 3 x 4 cm clean appearing superficial blister noted involving the left plantar foot over the metatarsal heads          Assessment & Plan:   Blister left foot. Local wound care discussed Diabetes mellitus. Medications refilled his Amaryl be increased to 4 mg daily he is asked return in 3 months for followup [employer] obesity with weight gain weight loss encouraged reassess in 3 months FMLA form completion forms completed and given back to the patient

## 2011-12-25 NOTE — Telephone Encounter (Signed)
Appt scheduled

## 2011-12-25 NOTE — Telephone Encounter (Signed)
Pt can come in at 430 today - that is the only time I can offer

## 2012-10-10 ENCOUNTER — Ambulatory Visit (INDEPENDENT_AMBULATORY_CARE_PROVIDER_SITE_OTHER): Payer: 59 | Admitting: Internal Medicine

## 2012-10-10 ENCOUNTER — Encounter: Payer: Self-pay | Admitting: Internal Medicine

## 2012-10-10 VITALS — BP 140/90 | HR 96 | Temp 98.4°F | Resp 20 | Wt 242.0 lb

## 2012-10-10 DIAGNOSIS — E119 Type 2 diabetes mellitus without complications: Secondary | ICD-10-CM

## 2012-10-10 DIAGNOSIS — M542 Cervicalgia: Secondary | ICD-10-CM

## 2012-10-10 DIAGNOSIS — E669 Obesity, unspecified: Secondary | ICD-10-CM

## 2012-10-10 MED ORDER — METFORMIN HCL 1000 MG PO TABS: 1000.0000 mg | ORAL_TABLET | Freq: Two times a day (BID) | ORAL | Status: DC

## 2012-10-10 MED ORDER — GLIMEPIRIDE 4 MG PO TABS: 4.0000 mg | ORAL_TABLET | Freq: Every day | ORAL | Status: DC

## 2012-10-10 MED ORDER — PREDNISONE 10 MG PO TABS: 10.0000 mg | ORAL_TABLET | Freq: Two times a day (BID) | ORAL | Status: DC

## 2012-10-10 MED ORDER — GLUCOSE BLOOD VI STRP: ORAL_STRIP | Status: DC

## 2012-10-10 NOTE — Progress Notes (Signed)
Subjective:    Patient ID: Derrick West, male    DOB: Mar 31, 1976, 37 y.o.   MRN: [id number]  HPI  37 year old patient who has a history of lumbar disc disease. He is status post lumbar laminectomy in 2003 due  to a herniated disc.  For the past 4 weeks he has had the neck pain is aggravated by movement to the right. He has referred pain to the right upper back shoulder and upper arm area. Denies any motor weakness. He has a history of diabetes but has not been seen in this office in some time. He states that he had a hemoglobin A1c checked this past summer at work. He states the hemoglobin A1c was less than 6. He also complains of inflammatory nodule involving his left lower lip. This has improved over the past few days. Due to the neck pain he was evaluated at a local urgent care and was treated with anti-inflammatory medications without much benefit.  Past Medical History  Diagnosis Date  . DIABETES MELLITUS, TYPE II 12/24/2007  . GANGLION CYST, WRIST, LEFT 07/29/2007  . OBESITY 12/24/2007    History   Social History  . Marital Status: Married    Spouse Name: N/A    Number of Children: N/A  . Years of Education: N/A   Occupational History  . Not on file.   Social History Main Topics  . Smoking status: Never Smoker   . Smokeless tobacco: Not on file  . Alcohol Use: Not on file  . Drug Use: Not on file  . Sexually Active: Not on file   Other Topics Concern  . Not on file   Social History Narrative  . No narrative on file    Past Surgical History  Procedure Date  . Lumbar laminectomy   . Vsd repair     No family history on file.  Allergies  Allergen Reactions  . Amoxicillin     Current Outpatient Prescriptions on File Prior to Visit  Medication Sig Dispense Refill  . glimepiride (AMARYL) 4 MG tablet Take 1 tablet (4 mg total) by mouth daily before breakfast.  30 tablet  3  . metFORMIN (GLUCOPHAGE) 1000 MG tablet Take 1 tablet (1,000 mg total) by mouth 2 (two) times  daily with a meal.  180 tablet  2  . naproxen sodium (ANAPROX) 220 MG tablet Take 220 mg by mouth as needed.          BP 140/90  Pulse 96  Temp 98.4 F (36.9 C) (Oral)  Resp 20  Wt 242 lb (109.77 kg)  SpO2 98%       Review of Systems  Constitutional: Negative for fever, chills, appetite change and fatigue.  HENT: Negative for hearing loss, ear pain, congestion, sore throat, trouble swallowing, neck stiffness, dental problem, voice change and tinnitus.   Eyes: Negative for pain, discharge and visual disturbance.  Respiratory: Negative for cough, chest tightness, wheezing and stridor.   Cardiovascular: Negative for chest pain, palpitations and leg swelling.  Gastrointestinal: Negative for nausea, vomiting, abdominal pain, diarrhea, constipation, blood in stool and abdominal distention.  Genitourinary: Negative for urgency, hematuria, flank pain, discharge, difficulty urinating and genital sores.  Musculoskeletal: Positive for arthralgias. Negative for myalgias, back pain, joint swelling and gait problem.  Skin: Negative for rash.  Neurological: Negative for dizziness, syncope, speech difficulty, weakness, numbness and headaches.  Hematological: Negative for adenopathy. Does not bruise/bleed easily.  Psychiatric/Behavioral: Negative for behavioral problems and dysphoric mood. The patient is not nervous/anxious.  Objective:   Physical Exam  Constitutional: He is oriented to person, place, and time. He appears well-developed.       Blood pressure 140/90 Weight 242  HENT:  Head: Normocephalic.  Right Ear: External ear normal.  Left Ear: External ear normal.  Eyes: Conjunctivae normal and EOM are normal.  Neck: Normal range of motion.  Cardiovascular: Normal rate and normal heart sounds.   Pulmonary/Chest: Breath sounds normal.  Abdominal: Bowel sounds are normal.  Musculoskeletal: Normal range of motion. He exhibits no edema and no tenderness.       Question of  slightly impaired right shoulder shrug Grip strength and biceps and triceps muscle strength normal Reflexes generally diminished but symmetrical  Neurological: He is alert and oriented to person, place, and time.  Psychiatric: He has a normal mood and affect. His behavior is normal.          Assessment & Plan:   Neck pain. Possible cervical disc disease. Will treat with a brief course of oral prednisone and observe. A soft cervical collar was recommended. Will check a hemoglobin A1c Diabetes mellitus. Will check a hemoglobin A1c Obesity weight loss encouraged

## 2012-10-10 NOTE — Patient Instructions (Addendum)
You  may move around, but avoid painful motions and activities.   Soft cervical collar as discussed  Call or return to clinic prn if these symptoms worsen or fail to improve as anticipated.   Please check your hemoglobin A1c every 3 months  You need to lose weight.  Consider a lower calorie diet and regular exercise.

## 2012-10-11 LAB — HEMOGLOBIN A1C
Hgb A1c MFr Bld: 8.6 % — ABNORMAL HIGH (ref <5.7)
Mean Plasma Glucose: 200 mg/dL — ABNORMAL HIGH (ref <117)

## 2012-10-15 ENCOUNTER — Encounter: Payer: Self-pay | Admitting: Internal Medicine

## 2012-10-15 ENCOUNTER — Ambulatory Visit (INDEPENDENT_AMBULATORY_CARE_PROVIDER_SITE_OTHER): Payer: 59 | Admitting: Internal Medicine

## 2012-10-15 VITALS — BP 150/90 | HR 84 | Temp 97.8°F | Resp 18 | Wt 241.0 lb

## 2012-10-15 DIAGNOSIS — L988 Other specified disorders of the skin and subcutaneous tissue: Secondary | ICD-10-CM

## 2012-10-15 DIAGNOSIS — R238 Other skin changes: Secondary | ICD-10-CM

## 2012-10-15 DIAGNOSIS — E119 Type 2 diabetes mellitus without complications: Secondary | ICD-10-CM

## 2012-10-15 MED ORDER — DOXYCYCLINE HYCLATE 100 MG PO TABS: 100.0000 mg | ORAL_TABLET | Freq: Two times a day (BID) | ORAL | Status: DC

## 2012-10-15 NOTE — Progress Notes (Signed)
  Subjective:    Patient ID: Derrick West, male    DOB: 1976/02/04, 37 y.o.   MRN: [id number]  HPI  37 year old patient who has diabetes. He has had an inflammatory papule involving his left lower lip for a few weeks. This has caused some cosmetic concerns as well as occasional bleeding he remains slightly tender and continues to enlarge slightly. There has been no drainage of exudate.  Past Medical History  Diagnosis Date  . DIABETES MELLITUS, TYPE II 12/24/2007  . GANGLION CYST, WRIST, LEFT 07/29/2007  . OBESITY 12/24/2007    History   Social History  . Marital Status: Married    Spouse Name: N/A    Number of Children: N/A  . Years of Education: N/A   Occupational History  . Not on file.   Social History Main Topics  . Smoking status: Never Smoker   . Smokeless tobacco: Not on file  . Alcohol Use: Not on file  . Drug Use: Not on file  . Sexually Active: Not on file   Other Topics Concern  . Not on file   Social History Narrative  . No narrative on file    Past Surgical History  Procedure Date  . Lumbar laminectomy   . Vsd repair     No family history on file.  Allergies  Allergen Reactions  . Amoxicillin     Current Outpatient Prescriptions on File Prior to Visit  Medication Sig Dispense Refill  . glimepiride (AMARYL) 4 MG tablet Take 1 tablet (4 mg total) by mouth daily before breakfast.  30 tablet  3  . glucose blood test strip Once daily, PRN Dx 250.00  100 each  6  . metFORMIN (GLUCOPHAGE) 1000 MG tablet Take 1 tablet (1,000 mg total) by mouth 2 (two) times daily with a meal.  180 tablet  2  . naproxen sodium (ANAPROX) 220 MG tablet Take 220 mg by mouth as needed.        . predniSONE (DELTASONE) 10 MG tablet Take 1 tablet (10 mg total) by mouth 2 (two) times daily.  20 tablet  0    BP 150/90  Pulse 84  Temp 97.8 F (36.6 C) (Oral)  Resp 18  Wt 241 lb (109.317 kg)       Review of Systems  Skin: Positive for wound.       Objective:   Physical  Exam  Skin:       5-6 mm inflamed papule left lower lip          Assessment & Plan:   Inflammatory papule left lower lip.  After local anesthesia with 2% Xylocaine, the papule was punctured with a large-bore needle and slightly decompressed;  bleeding controlled with pressure and electrocautery and the area wrapped with a small bandage; very little exudate returned.  Will empirically place on doxycycline (allergy amoxicillin). We'll call if the there is no proximal medical improvement

## 2012-10-15 NOTE — Patient Instructions (Addendum)
Take your antibiotic as prescribed until ALL of it is gone, but stop if you develop a rash, swelling, or any side effects of the medication.  Contact our office as soon as possible if  there are side effects of the medication.   Please check your hemoglobin A1c every 3 months  You need to lose weight.  Consider a lower calorie diet and regular exercise.

## 2012-10-23 ENCOUNTER — Ambulatory Visit: Payer: 59 | Admitting: Family

## 2012-10-24 ENCOUNTER — Ambulatory Visit (INDEPENDENT_AMBULATORY_CARE_PROVIDER_SITE_OTHER)
Admission: RE | Admit: 2012-10-24 | Discharge: 2012-10-24 | Disposition: A | Discharge status: Home / Self Care | Payer: 59 | Source: Ambulatory Visit | Attending: Family | Admitting: Family

## 2012-10-24 ENCOUNTER — Encounter: Payer: Self-pay | Admitting: Family

## 2012-10-24 ENCOUNTER — Ambulatory Visit (INDEPENDENT_AMBULATORY_CARE_PROVIDER_SITE_OTHER): Payer: 59 | Admitting: Family

## 2012-10-24 VITALS — BP 130/88 | HR 99 | Temp 98.7°F | Wt 241.0 lb

## 2012-10-24 DIAGNOSIS — M542 Cervicalgia: Secondary | ICD-10-CM

## 2012-10-24 DIAGNOSIS — M5412 Radiculopathy, cervical region: Secondary | ICD-10-CM

## 2012-10-24 MED ORDER — CYCLOBENZAPRINE HCL 5 MG PO TABS: 5.0000 mg | ORAL_TABLET | Freq: Three times a day (TID) | ORAL | Status: DC | PRN

## 2012-10-24 MED ORDER — HYDROCODONE-ACETAMINOPHEN 10-325 MG PO TABS: 1.0000 | ORAL_TABLET | Freq: Three times a day (TID) | ORAL | Status: DC | PRN

## 2012-10-24 NOTE — Patient Instructions (Addendum)
Cervical Radiculopathy  Cervical radiculopathy happens when a nerve in the neck is pinched or bruised by a slipped (herniated) disk or by arthritic changes in the bones of the cervical spine. This can occur due to an injury or as part of the normal aging process. Pressure on the cervical nerves can cause pain or numbness that runs from your neck all the way down into your arm and fingers.  CAUSES   There are many possible causes, including:   Injury.   Muscle tightness in the neck from overuse.   Swollen, painful joints (arthritis).   Breakdown or degeneration in the bones and joints of the spine (spondylosis) due to aging.   Bone spurs that may develop near the cervical nerves.  SYMPTOMS   Symptoms include pain, weakness, or numbness in the affected arm and hand. Pain can be severe or irritating. Symptoms may be worse when extending or turning the neck.  DIAGNOSIS   Your caregiver will ask about your symptoms and do a physical exam. He or she may test your strength and reflexes. X-rays, CT scans, and MRI scans may be needed in cases of injury or if the symptoms do not go away after a period of time. Electromyography (EMG) or nerve conduction testing may be done to study how your nerves and muscles are working.  TREATMENT   Your caregiver may recommend certain exercises to help relieve your symptoms. Cervical radiculopathy can, and often does, get better with time and treatment. If your problems continue, treatment options may include:   Wearing a soft collar for short periods of time.   Physical therapy to strengthen the neck muscles.   Medicines, such as nonsteroidal anti-inflammatory drugs (NSAIDs), oral corticosteroids, or spinal injections.   Surgery. Different types of surgery may be done depending on the cause of your problems.  HOME CARE INSTRUCTIONS    Put ice on the affected area.   Put ice in a plastic bag.   Place a towel between your skin and the bag.   Leave the ice on for 15 to 20  minutes, 3 to 4 times a day or as directed by your caregiver.   If ice does not help, you can try using heat. Take a warm shower or bath, or use a hot water bottle as directed by your caregiver.   You may try a gentle neck and shoulder massage.   Use a flat pillow when you sleep.   Only take over-the-counter or prescription medicines for pain, discomfort, or fever as directed by your caregiver.   If physical therapy was prescribed, follow your caregiver's directions.   If a soft collar was prescribed, use it as directed.  SEEK IMMEDIATE MEDICAL CARE IF:    Your pain gets much worse and cannot be controlled with medicines.   You have weakness or numbness in your hand, arm, face, or leg.   You have a high fever or a stiff, rigid neck.   You lose bowel or bladder control (incontinence).   You have trouble with walking, balance, or speaking.  MAKE SURE YOU:    Understand these instructions.   Will watch your condition.   Will get help right away if you are not doing well or get worse.  Document Released: 06/05/2001 Document Revised: 12/03/2011 Document Reviewed: 04/24/2011  [employer] Patient Information 2013 [employer].

## 2012-10-24 NOTE — Progress Notes (Signed)
  Subjective:    Patient ID: Derrick West, male    DOB: [birth year], 37 y.o.   MRN: [id number]  HPI 37 year old white male, nonsmoker, patient of Dr. Kirtland Bouchard. is in with persistent neck pain. He saw Dr. Kirtland Bouchard. 10/04/2012 neck pain radiating to his right shoulder and was prescribed prednisone. Patient reports the prednisone did not help her symptoms at all. He continues to have neck pain that is constant, rates it a 6-7/10. No worse with movement. It's better when he extends his neck back on a chair. The pain radiates down his right arm. Denies any numbness or tingling.   Review of Systems  Constitutional: Negative.   Respiratory: Negative.   Cardiovascular: Negative.   Musculoskeletal: Positive for myalgias and arthralgias.       Left shoulder and neck pain  Skin: Negative.   Neurological: Negative.   Hematological: Negative.   Psychiatric/Behavioral: Negative.        Objective:   Physical Exam  Constitutional: He is oriented to person, place, and time. He appears well-developed and well-nourished.  Neck: Normal range of motion. Neck supple.  Cardiovascular: Normal rate, regular rhythm and normal heart sounds.   Pulmonary/Chest: Effort normal and breath sounds normal.  Musculoskeletal: Normal range of motion. He exhibits no edema and no tenderness.       Pain remains constant despite movement.  Neurological: He is alert and oriented to person, place, and time. He has normal reflexes. He displays normal reflexes. No cranial nerve deficit. Coordination normal.  Skin: Skin is warm and dry.  Psychiatric: He has a normal mood and affect.          Assessment & Plan:  Assessment:  1. Neck pain 2. Cervical radiculopathy  Plan: X-ray of the C-spine. Will notify patient pending results. Flexeril 5 mg 3 times a day. Warned of drowsiness. Continue naproxen. Hydrocodone as needed for pain. Warned of drowsiness.

## 2012-11-24 ENCOUNTER — Ambulatory Visit (INDEPENDENT_AMBULATORY_CARE_PROVIDER_SITE_OTHER): Payer: 59 | Admitting: Internal Medicine

## 2012-11-24 ENCOUNTER — Encounter: Payer: Self-pay | Admitting: Internal Medicine

## 2012-11-24 VITALS — BP 152/90 | HR 96 | Temp 98.1°F | Resp 18 | Wt 263.0 lb

## 2012-11-24 DIAGNOSIS — M542 Cervicalgia: Secondary | ICD-10-CM | POA: Insufficient documentation

## 2012-11-24 DIAGNOSIS — E669 Obesity, unspecified: Secondary | ICD-10-CM

## 2012-11-24 DIAGNOSIS — E119 Type 2 diabetes mellitus without complications: Secondary | ICD-10-CM

## 2012-11-24 MED ORDER — HYDROCODONE-ACETAMINOPHEN 10-325 MG PO TABS: 1.0000 | ORAL_TABLET | Freq: Three times a day (TID) | ORAL | Status: DC | PRN

## 2012-11-24 NOTE — Progress Notes (Signed)
  Subjective:    Patient ID: Derrick West, male    DOB: 1975-12-16, 37 y.o.   MRN: [id number]  HPI  37 year old patient who has a history lumbar disc disease who presents with a persistent neck pain this has been present for least 5 months it is worse involving the right lateral neck and right upper back. He now describes some heaviness involving both arms. No true motor weakness. He has been on anti-inflammatory medications and a short course of oral prednisone. Pain is unimproved.  Past Medical History  Diagnosis Date  . DIABETES MELLITUS, TYPE II 12/24/2007  . GANGLION CYST, WRIST, LEFT 07/29/2007  . OBESITY 12/24/2007    History   Social History  . Marital Status: Married    Spouse Name: N/A    Number of Children: N/A  . Years of Education: N/A   Occupational History  . Not on file.   Social History Main Topics  . Smoking status: Never Smoker   . Smokeless tobacco: Not on file  . Alcohol Use: Not on file  . Drug Use: Not on file  . Sexually Active: Not on file   Other Topics Concern  . Not on file   Social History Narrative  . No narrative on file    Past Surgical History  Procedure Laterality Date  . Lumbar laminectomy    . Vsd repair      No family history on file.  Allergies  Allergen Reactions  . Amoxicillin     Current Outpatient Prescriptions on File Prior to Visit  Medication Sig Dispense Refill  . glimepiride (AMARYL) 4 MG tablet Take 1 tablet (4 mg total) by mouth daily before breakfast.  30 tablet  3  . glucose blood test strip Once daily, PRN Dx 250.00  100 each  6  . metFORMIN (GLUCOPHAGE) 1000 MG tablet Take 1 tablet (1,000 mg total) by mouth 2 (two) times daily with a meal.  180 tablet  2  . naproxen sodium (ANAPROX) 220 MG tablet Take 220 mg by mouth as needed.         No current facility-administered medications on file prior to visit.    BP 152/90  Pulse 96  Temp(Src) 98.1 F (36.7 C) (Oral)  Resp 18  Wt 263 lb (119.296 kg)  BMI 39.4  kg/m2  SpO2 98%       Review of Systems  Musculoskeletal:       Neck pain       Objective:   Physical Exam  Musculoskeletal:  Range of motion the neck fairly well intact but slightly uncomfortable  Normal grip strength          Assessment & Plan:   Chronic right neck pain Next osteoarthritis  Will proceed with cervical MRI

## 2012-11-24 NOTE — Patient Instructions (Signed)
Please check your hemoglobin A1c every 3 months  Cervical Sprain A cervical sprain is an injury in the neck in which the ligaments are stretched or torn. The ligaments are the tissues that hold the bones of the neck (vertebrae) in place.Cervical sprains can range from very mild to very severe. Most cervical sprains get better in 1 to 3 weeks, but it depends on the cause and extent of the injury. Severe cervical sprains can cause the neck vertebrae to be unstable. This can lead to damage of the spinal cord and can result in serious nervous system problems. Your caregiver will determine whether your cervical sprain is mild or severe. CAUSES  Severe cervical sprains may be caused by:  Contact sport injuries (football, rugby, wrestling, hockey, auto racing, gymnastics, diving, martial arts, boxing).  Motor vehicle collisions.  Whiplash injuries. This means the neck is forcefully whipped backward and forward.  Falls. Mild cervical sprains may be caused by:   Awkward positions, such as cradling a telephone between your ear and shoulder.  Sitting in a chair that does not offer proper support.  Working at a poorly Marketing executive station.  Activities that require looking up or down for long periods of time. SYMPTOMS   Pain, soreness, stiffness, or a burning sensation in the front, back, or sides of the neck. This discomfort may develop immediately after injury or it may develop slowly and not begin for 24 hours or more after an injury.  Pain or tenderness directly in the middle of the back of the neck.  Shoulder or upper back pain.  Limited ability to move the neck.  Headache.  Dizziness.  Weakness, numbness, or tingling in the hands or arms.  Muscle spasms.  Difficulty swallowing or chewing.  Tenderness and swelling of the neck. DIAGNOSIS  Most of the time, your caregiver can diagnose this problem by taking your history and doing a physical exam. Your caregiver will ask  about any known problems, such as arthritis in the neck or a previous neck injury. X-rays may be taken to find out if there are any other problems, such as problems with the bones of the neck. However, an X-ray often does not reveal the full extent of a cervical sprain. Other tests such as a computed tomography (CT) scan or magnetic resonance imaging (MRI) may be needed. TREATMENT  Treatment depends on the severity of the cervical sprain. Mild sprains can be treated with rest, keeping the neck in place (immobilization), and pain medicines. Severe cervical sprains need immediate immobilization and an appointment with an orthopedist or neurosurgeon. Several treatment options are available to help with pain, muscle spasms, and other symptoms. Your caregiver may prescribe:  Medicines, such as pain relievers, numbing medicines, or muscle relaxants.  Physical therapy. This can include stretching exercises, strengthening exercises, and posture training. Exercises and improved posture can help stabilize the neck, strengthen muscles, and help stop symptoms from returning.  A neck collar to be worn for short periods of time. Often, these collars are worn for comfort. However, certain collars may be worn to protect the neck and prevent further worsening of a serious cervical sprain. HOME CARE INSTRUCTIONS   Put ice on the injured area.  Put ice in a plastic bag.  Place a towel between your skin and the bag.  Leave the ice on for 15 to 20 minutes, 3 to 4 times a day.  Only take over-the-counter or prescription medicines for pain, discomfort, or fever as directed by your caregiver.  Keep all follow-up appointments as directed by your caregiver.  Keep all physical therapy appointments as directed by your caregiver.  If a neck collar is prescribed, wear it as directed by your caregiver.  Do not drive while wearing a neck collar.  Make any needed adjustments to your work station to promote good  posture.  Avoid positions and activities that make your symptoms worse.  Warm up and stretch before being active to help prevent problems. SEEK MEDICAL CARE IF:   Your pain is not controlled with medicine.  You are unable to decrease your pain medicine over time as planned.  Your activity level is not improving as expected. SEEK IMMEDIATE MEDICAL CARE IF:   You develop any bleeding, stomach upset, or signs of an allergic reaction to your medicine.  Your symptoms get worse.  You develop new, unexplained symptoms.  You have numbness, tingling, weakness, or paralysis in any part of your body. MAKE SURE YOU:   Understand these instructions.  Will watch your condition.  Will get help right away if you are not doing well or get worse. Document Released: 07/08/2007 Document Revised: 12/03/2011 Document Reviewed: 06/13/2011 [facility] Patient Information 2013 [place], [place].

## 2012-11-27 ENCOUNTER — Other Ambulatory Visit: Payer: 59

## 2012-12-03 ENCOUNTER — Ambulatory Visit
Admission: RE | Admit: 2012-12-03 | Discharge: 2012-12-03 | Disposition: A | Discharge status: Home / Self Care | Payer: 59 | Source: Ambulatory Visit | Attending: Internal Medicine | Admitting: Internal Medicine

## 2012-12-03 DIAGNOSIS — M542 Cervicalgia: Secondary | ICD-10-CM

## 2012-12-04 ENCOUNTER — Other Ambulatory Visit: Payer: 59

## 2012-12-05 ENCOUNTER — Other Ambulatory Visit: Payer: Self-pay | Admitting: Internal Medicine

## 2012-12-05 DIAGNOSIS — M5030 Other cervical disc degeneration, unspecified cervical region: Secondary | ICD-10-CM

## 2012-12-05 DIAGNOSIS — M4712 Other spondylosis with myelopathy, cervical region: Secondary | ICD-10-CM

## 2013-01-12 ENCOUNTER — Encounter: Payer: Self-pay | Admitting: *Deleted

## 2013-01-12 ENCOUNTER — Other Ambulatory Visit: Payer: Self-pay | Admitting: *Deleted

## 2013-01-12 MED ORDER — HYDROCODONE-ACETAMINOPHEN 10-325 MG PO TABS: 1.0000 | ORAL_TABLET | Freq: Three times a day (TID) | ORAL | Status: DC | PRN

## 2013-01-22 ENCOUNTER — Other Ambulatory Visit: Payer: Self-pay | Admitting: Neurosurgery

## 2013-02-09 ENCOUNTER — Other Ambulatory Visit: Payer: Self-pay | Admitting: Internal Medicine

## 2013-02-09 ENCOUNTER — Other Ambulatory Visit: Payer: Self-pay | Admitting: *Deleted

## 2013-02-09 MED ORDER — HYDROCODONE-ACETAMINOPHEN 10-325 MG PO TABS: 1.0000 | ORAL_TABLET | Freq: Three times a day (TID) | ORAL | Status: DC | PRN

## 2013-02-09 NOTE — Telephone Encounter (Signed)
Rx for Hydrocodone printed, signed and faxed to pharmacy.

## 2013-03-12 ENCOUNTER — Other Ambulatory Visit: Payer: Self-pay | Admitting: *Deleted

## 2013-03-19 ENCOUNTER — Other Ambulatory Visit: Payer: Self-pay | Admitting: *Deleted

## 2013-03-19 MED ORDER — HYDROCODONE-ACETAMINOPHEN 10-325 MG PO TABS: 1.0000 | ORAL_TABLET | Freq: Three times a day (TID) | ORAL | Status: DC | PRN

## 2013-03-20 ENCOUNTER — Other Ambulatory Visit (HOSPITAL_COMMUNITY): Payer: 59

## 2013-04-07 ENCOUNTER — Encounter (HOSPITAL_COMMUNITY): Payer: Self-pay | Admitting: Pharmacy Technician

## 2013-04-07 NOTE — Pre-Procedure Instructions (Signed)
Derrick West  04/07/2013   Your procedure is scheduled on: Wednesday, April 15, 2013  Report to [facility] Short Stay Center at 6:00 AM.  Call this number if you have problems the morning of surgery: [phone]   Remember:   Do not eat food or drink liquids after midnight.   Take these medicines the morning of surgery with A SIP OF WATER: if needed: HYDROcodone-acetaminophen (NORCO) 10-325 MG per tablet for pain Stop taking Aspirin and herbal medications. Do not take any NSAIDs ie: Ibuprofen, Advil, Naproxen or any medication containing Aspirin.  Do not wear jewelry, make-up or nail polish.  Do not wear lotions, powders, or perfumes. You may wear deodorant.  Do not shave 48 hours prior to surgery. Men may shave face and neck.  Do not bring valuables to the hospital.  [facility] is not responsible for any belongings or valuables.  Contacts, dentures or bridgework may not be worn into surgery.  Leave suitcase in the car. After surgery it may be brought to your room.  For patients admitted to the hospital, checkout time is 11:00 AM the day of discharge.   Patients discharged the day of surgery will not be allowed to drive home.  Name and phone number of your driver:   Special Instructions: Shower using CHG 2 nights before surgery and the night before surgery.  If you shower the day of surgery use CHG.  Use special wash - you have one bottle of CHG for all showers.  You should use approximately 1/3 of the bottle for each shower.   Please read over the following fact sheets that you were given: Pain Booklet, Coughing and Deep Breathing, MRSA Information and Surgical Site Infection Prevention

## 2013-04-08 ENCOUNTER — Encounter (HOSPITAL_COMMUNITY)
Admission: RE | Admit: 2013-04-08 | Discharge: 2013-04-08 | Disposition: A | Discharge status: Home / Self Care | Payer: 59 | Source: Ambulatory Visit | Attending: Neurosurgery | Admitting: Neurosurgery

## 2013-04-08 ENCOUNTER — Encounter (HOSPITAL_COMMUNITY): Payer: Self-pay

## 2013-04-08 DIAGNOSIS — Z01812 Encounter for preprocedural laboratory examination: Secondary | ICD-10-CM | POA: Insufficient documentation

## 2013-04-08 DIAGNOSIS — Z01810 Encounter for preprocedural cardiovascular examination: Secondary | ICD-10-CM | POA: Insufficient documentation

## 2013-04-08 DIAGNOSIS — Z01818 Encounter for other preprocedural examination: Secondary | ICD-10-CM | POA: Insufficient documentation

## 2013-04-08 HISTORY — DX: Headache: R51

## 2013-04-08 LAB — BASIC METABOLIC PANEL
BUN: 12 mg/dL (ref 6–23)
CO2: 27 mEq/L (ref 19–32)
Calcium: 9.5 mg/dL (ref 8.4–10.5)
Chloride: 102 mEq/L (ref 96–112)
Creatinine, Ser: 0.92 mg/dL (ref 0.50–1.35)
GFR calc Af Amer: >90 mL/min (ref >90)
GFR calc non Af Amer: >90 mL/min (ref >90)
Glucose, Bld: 108 mg/dL — ABNORMAL HIGH (ref 70–99)
Potassium: 3.7 mEq/L (ref 3.5–5.1)
Sodium: 140 mEq/L (ref 135–145)

## 2013-04-08 LAB — CBC
HCT: 45.8 % (ref 39.0–52.0)
Hemoglobin: 16.1 g/dL (ref 13.0–17.0)
MCH: 29 pg (ref 26.0–34.0)
MCHC: 35.2 g/dL (ref 30.0–36.0)
MCV: 82.4 fL (ref 78.0–100.0)
Platelets: 225 10*3/uL (ref 150–400)
RBC: 5.56 MIL/uL (ref 4.22–5.81)
RDW: 12.6 % (ref 11.5–15.5)
WBC: 8.6 10*3/uL (ref 4.0–10.5)

## 2013-04-08 LAB — SURGICAL PCR SCREEN
MRSA, PCR: NEGATIVE
Staphylococcus aureus: NEGATIVE

## 2013-04-09 NOTE — Progress Notes (Signed)
Anesthesia chart review: Patient is a 37 year old male scheduled for C5-6 ACDF on 04/15/2013 by Dr. Franky Macho. History includes morbid obesity (BMI 40), non-smoker, DM2, headaches, repair of a hole in his heart (VSD or ASD repair) in 1979 at [facility].  He reports that he was later released from cardiology follow-up and was cleared to play sports as a teenager.  He denies any CV symptoms.  BP was 141/92 at PAT.  He is not on any anti-hypertensives. PCP is Dr. Eleonore Chiquito.  EKG on 04/08/13 showed NSR, incomplete right BBB, non-specific T wave abnormality, prolonged QT.  It was not felt significantly changed since prior tracing on 10/12/09.  No CXR was done at his PAT visit.  His last CXR was in 2011 and showed cardiomegaly.  Will order a preoperative CXR due to history of prior cardiac surgery with cardiomegaly on prior CXR and elevated BP at PAT.  Preoperative labs noted.  I have updated anesthesiologists Dr. Krista Blue and Dr. Noreene Larsson.  He will be evaluated by his assigned anesthesiologist on the day of surgery, but if no acute change then anticipate that he can proceed as planned.  Velna Ochs [facility] Short Stay Center/Anesthesiology Phone [phone] 04/09/2013 5:21 PM

## 2013-04-14 MED ORDER — VANCOMYCIN HCL 10 G IV SOLR
1500.0000 mg | INTRAVENOUS | Status: AC
  Administered 2013-04-15: 1500 mg via INTRAVENOUS
  Filled 2013-04-14: qty 1500

## 2013-04-15 ENCOUNTER — Ambulatory Visit (HOSPITAL_COMMUNITY): Payer: 59

## 2013-04-15 ENCOUNTER — Encounter (HOSPITAL_COMMUNITY): Payer: Self-pay | Admitting: *Deleted

## 2013-04-15 ENCOUNTER — Ambulatory Visit (HOSPITAL_COMMUNITY): Payer: 59 | Admitting: Anesthesiology

## 2013-04-15 ENCOUNTER — Ambulatory Visit (HOSPITAL_COMMUNITY)
Admission: RE | Admit: 2013-04-15 | Discharge: 2013-04-16 | Disposition: A | Discharge status: Home / Self Care | Payer: 59 | Source: Ambulatory Visit | Attending: Neurosurgery | Admitting: Neurosurgery

## 2013-04-15 ENCOUNTER — Encounter (HOSPITAL_COMMUNITY)
Admission: RE | Disposition: A | Discharge status: Home / Self Care | Payer: Self-pay | Source: Ambulatory Visit | Attending: Neurosurgery

## 2013-04-15 ENCOUNTER — Encounter (HOSPITAL_COMMUNITY): Payer: Self-pay | Admitting: Vascular Surgery

## 2013-04-15 DIAGNOSIS — Z01812 Encounter for preprocedural laboratory examination: Secondary | ICD-10-CM | POA: Insufficient documentation

## 2013-04-15 DIAGNOSIS — Z79899 Other long term (current) drug therapy: Secondary | ICD-10-CM | POA: Insufficient documentation

## 2013-04-15 DIAGNOSIS — M5020 Other cervical disc displacement, unspecified cervical region: Secondary | ICD-10-CM | POA: Insufficient documentation

## 2013-04-15 DIAGNOSIS — E119 Type 2 diabetes mellitus without complications: Secondary | ICD-10-CM | POA: Insufficient documentation

## 2013-04-15 DIAGNOSIS — M47812 Spondylosis without myelopathy or radiculopathy, cervical region: Secondary | ICD-10-CM | POA: Insufficient documentation

## 2013-04-15 DIAGNOSIS — Z01810 Encounter for preprocedural cardiovascular examination: Secondary | ICD-10-CM | POA: Insufficient documentation

## 2013-04-15 HISTORY — PX: ANTERIOR CERVICAL DECOMP/DISCECTOMY FUSION: SHX1161

## 2013-04-15 LAB — GLUCOSE, CAPILLARY
Glucose-Capillary: 125 mg/dL — ABNORMAL HIGH (ref 70–99)
Glucose-Capillary: 212 mg/dL — ABNORMAL HIGH (ref 70–99)
Glucose-Capillary: 207 mg/dL — ABNORMAL HIGH (ref 70–99)
Glucose-Capillary: 222 mg/dL — ABNORMAL HIGH (ref 70–99)

## 2013-04-15 SURGERY — ANTERIOR CERVICAL DECOMPRESSION/DISCECTOMY FUSION 1 LEVEL
Anesthesia: General | Wound class: Clean

## 2013-04-15 MED ORDER — FENTANYL CITRATE 0.05 MG/ML IJ SOLN
INTRAMUSCULAR | Status: DC | PRN
  Administered 2013-04-15 (×3): 50 ug via INTRAVENOUS
  Administered 2013-04-15: 250 ug via INTRAVENOUS

## 2013-04-15 MED ORDER — HYDROMORPHONE HCL PF 1 MG/ML IJ SOLN
0.2500 mg | INTRAMUSCULAR | Status: DC | PRN
  Administered 2013-04-15 (×2): 0.5 mg via INTRAVENOUS

## 2013-04-15 MED ORDER — GLIMEPIRIDE 4 MG PO TABS
4.0000 mg | ORAL_TABLET | Freq: Every day | ORAL | Status: DC
  Administered 2013-04-16: 4 mg via ORAL
  Filled 2013-04-15 (×2): qty 1

## 2013-04-15 MED ORDER — OXYCODONE-ACETAMINOPHEN 5-325 MG PO TABS
1.0000 | ORAL_TABLET | ORAL | Status: DC | PRN
  Administered 2013-04-15 – 2013-04-16 (×2): 2 via ORAL
  Filled 2013-04-15 (×2): qty 2

## 2013-04-15 MED ORDER — MENTHOL 3 MG MT LOZG: 1.0000 | LOZENGE | OROMUCOSAL | Status: DC | PRN

## 2013-04-15 MED ORDER — SODIUM CHLORIDE 0.9 % IV SOLN
INTRAVENOUS | Status: DC | PRN
  Administered 2013-04-15: 09:00:00 via INTRAVENOUS

## 2013-04-15 MED ORDER — OXYCODONE HCL 5 MG/5ML PO SOLN: 5.0000 mg | Freq: Once | ORAL | Status: AC | PRN

## 2013-04-15 MED ORDER — HEMOSTATIC AGENTS (NO CHARGE) OPTIME
TOPICAL | Status: DC | PRN
  Administered 2013-04-15: 1 via TOPICAL

## 2013-04-15 MED ORDER — NEOSTIGMINE METHYLSULFATE 1 MG/ML IJ SOLN
INTRAMUSCULAR | Status: DC | PRN
  Administered 2013-04-15: 3 mg via INTRAVENOUS

## 2013-04-15 MED ORDER — LIDOCAINE HCL 4 % MT SOLN
OROMUCOSAL | Status: DC | PRN
  Administered 2013-04-15: 4 mL via TOPICAL

## 2013-04-15 MED ORDER — METFORMIN HCL 500 MG PO TABS
1000.0000 mg | ORAL_TABLET | Freq: Two times a day (BID) | ORAL | Status: DC
Start: 2013-04-15 — End: 2013-04-16
  Administered 2013-04-15 – 2013-04-16 (×2): 1000 mg via ORAL
  Filled 2013-04-15 (×4): qty 2

## 2013-04-15 MED ORDER — POTASSIUM CHLORIDE IN NACL 20-0.9 MEQ/L-% IV SOLN
INTRAVENOUS | Status: DC
  Filled 2013-04-15 (×3): qty 1000

## 2013-04-15 MED ORDER — LIDOCAINE-EPINEPHRINE 0.5 %-1:200000 IJ SOLN
INTRAMUSCULAR | Status: DC | PRN
  Administered 2013-04-15: 5 mL via INTRADERMAL

## 2013-04-15 MED ORDER — POLYETHYLENE GLYCOL 3350 17 G PO PACK
17.0000 g | PACK | Freq: Every day | ORAL | Status: DC | PRN
  Filled 2013-04-15: qty 1

## 2013-04-15 MED ORDER — 0.9 % SODIUM CHLORIDE (POUR BTL) OPTIME
TOPICAL | Status: DC | PRN
  Administered 2013-04-15: 1000 mL

## 2013-04-15 MED ORDER — HYDROCODONE-ACETAMINOPHEN 5-325 MG PO TABS
1.0000 | ORAL_TABLET | ORAL | Status: DC | PRN
  Administered 2013-04-15: 2 via ORAL
  Filled 2013-04-15: qty 2

## 2013-04-15 MED ORDER — VECURONIUM BROMIDE 10 MG IV SOLR
INTRAVENOUS | Status: DC | PRN
  Administered 2013-04-15: 1 mg via INTRAVENOUS
  Administered 2013-04-15: 2 mg via INTRAVENOUS
  Administered 2013-04-15: 1 mg via INTRAVENOUS
  Administered 2013-04-15: 2 mg via INTRAVENOUS
  Administered 2013-04-15 (×2): 1 mg via INTRAVENOUS

## 2013-04-15 MED ORDER — HYDROMORPHONE HCL PF 1 MG/ML IJ SOLN
INTRAMUSCULAR | Status: AC
  Filled 2013-04-15: qty 1

## 2013-04-15 MED ORDER — ONDANSETRON HCL 4 MG/2ML IJ SOLN: 4.0000 mg | INTRAMUSCULAR | Status: DC | PRN

## 2013-04-15 MED ORDER — MIDAZOLAM HCL 2 MG/2ML IJ SOLN: 0.5000 mg | Freq: Once | INTRAMUSCULAR | Status: DC | PRN

## 2013-04-15 MED ORDER — GLYCOPYRROLATE 0.2 MG/ML IJ SOLN
INTRAMUSCULAR | Status: DC | PRN
  Administered 2013-04-15: 0.4 mg via INTRAVENOUS

## 2013-04-15 MED ORDER — MORPHINE SULFATE 2 MG/ML IJ SOLN: 1.0000 mg | INTRAMUSCULAR | Status: DC | PRN

## 2013-04-15 MED ORDER — SODIUM CHLORIDE 0.9 % IJ SOLN
3.0000 mL | Freq: Two times a day (BID) | INTRAMUSCULAR | Status: DC
  Administered 2013-04-15: 3 mL via INTRAVENOUS

## 2013-04-15 MED ORDER — ONDANSETRON HCL 4 MG/2ML IJ SOLN
INTRAMUSCULAR | Status: DC | PRN
  Administered 2013-04-15: 4 mg via INTRAVENOUS

## 2013-04-15 MED ORDER — ACETAMINOPHEN 325 MG PO TABS: 650.0000 mg | ORAL_TABLET | ORAL | Status: DC | PRN

## 2013-04-15 MED ORDER — LIDOCAINE HCL (CARDIAC) 20 MG/ML IV SOLN
INTRAVENOUS | Status: DC | PRN
  Administered 2013-04-15: 20 mg via INTRAVENOUS

## 2013-04-15 MED ORDER — PHENOL 1.4 % MT LIQD: 1.0000 | OROMUCOSAL | Status: DC | PRN

## 2013-04-15 MED ORDER — PROPOFOL 10 MG/ML IV BOLUS
INTRAVENOUS | Status: DC | PRN
  Administered 2013-04-15: 60 mg via INTRAVENOUS
  Administered 2013-04-15: 140 mg via INTRAVENOUS

## 2013-04-15 MED ORDER — LACTATED RINGERS IV SOLN
INTRAVENOUS | Status: DC | PRN
  Administered 2013-04-15 (×2): via INTRAVENOUS

## 2013-04-15 MED ORDER — OXYCODONE HCL 5 MG PO TABS
5.0000 mg | ORAL_TABLET | Freq: Once | ORAL | Status: AC | PRN
  Administered 2013-04-15: 5 mg via ORAL

## 2013-04-15 MED ORDER — ACETAMINOPHEN 650 MG RE SUPP: 650.0000 mg | RECTAL | Status: DC | PRN

## 2013-04-15 MED ORDER — MEPERIDINE HCL 25 MG/ML IJ SOLN: 6.2500 mg | INTRAMUSCULAR | Status: DC | PRN

## 2013-04-15 MED ORDER — DIAZEPAM 5 MG PO TABS
5.0000 mg | ORAL_TABLET | Freq: Four times a day (QID) | ORAL | Status: DC | PRN
  Administered 2013-04-15 – 2013-04-16 (×2): 5 mg via ORAL
  Filled 2013-04-15 (×2): qty 1

## 2013-04-15 MED ORDER — ROCURONIUM BROMIDE 100 MG/10ML IV SOLN
INTRAVENOUS | Status: DC | PRN
  Administered 2013-04-15: 50 mg via INTRAVENOUS

## 2013-04-15 MED ORDER — CYCLOBENZAPRINE HCL 10 MG PO TABS: 10.0000 mg | ORAL_TABLET | Freq: Three times a day (TID) | ORAL | Status: DC | PRN

## 2013-04-15 MED ORDER — THROMBIN 5000 UNITS EX SOLR
CUTANEOUS | Status: DC | PRN
  Administered 2013-04-15 (×2): 5000 [IU] via TOPICAL

## 2013-04-15 MED ORDER — OXYCODONE HCL 5 MG PO TABS
ORAL_TABLET | ORAL | Status: AC
  Filled 2013-04-15: qty 1

## 2013-04-15 MED ORDER — ROCURONIUM BROMIDE 100 MG/10ML IV SOLN: INTRAVENOUS | Status: DC | PRN

## 2013-04-15 MED ORDER — SENNA 8.6 MG PO TABS
1.0000 | ORAL_TABLET | Freq: Two times a day (BID) | ORAL | Status: DC
  Administered 2013-04-15: 8.6 mg via ORAL
  Filled 2013-04-15 (×3): qty 1

## 2013-04-15 MED ORDER — HYDROCODONE-ACETAMINOPHEN 5-325 MG PO TABS: 1.0000 | ORAL_TABLET | Freq: Four times a day (QID) | ORAL | Status: DC | PRN

## 2013-04-15 MED ORDER — SODIUM CHLORIDE 0.9 % IJ SOLN: 3.0000 mL | INTRAMUSCULAR | Status: DC | PRN

## 2013-04-15 MED ORDER — MIDAZOLAM HCL 5 MG/5ML IJ SOLN
INTRAMUSCULAR | Status: DC | PRN
  Administered 2013-04-15: 2 mg via INTRAVENOUS

## 2013-04-15 MED ORDER — PROMETHAZINE HCL 25 MG/ML IJ SOLN: 6.2500 mg | INTRAMUSCULAR | Status: DC | PRN

## 2013-04-15 SURGICAL SUPPLY — 69 items
BANDAGE GAUZE ELAST BULKY 4 IN (GAUZE/BANDAGES/DRESSINGS) ×4 IMPLANT
BIT DRILL NEURO 2X3.1 SFT TUCH (MISCELLANEOUS) ×1 IMPLANT
BLADE SURG ROTATE 9660 (MISCELLANEOUS) ×2 IMPLANT
BUR DRUM 4.0 (BURR) IMPLANT
CANISTER SUCTION 2500CC (MISCELLANEOUS) ×2 IMPLANT
CLOTH BEACON ORANGE TIMEOUT ST (SAFETY) ×2 IMPLANT
CONT SPEC 4OZ CLIKSEAL STRL BL (MISCELLANEOUS) ×2 IMPLANT
DECANTER SPIKE VIAL GLASS SM (MISCELLANEOUS) ×2 IMPLANT
DERMABOND ADVANCED (GAUZE/BANDAGES/DRESSINGS) ×1
DERMABOND ADVANCED .7 DNX12 (GAUZE/BANDAGES/DRESSINGS) ×1 IMPLANT
DRAPE LAPAROTOMY 100X72 PEDS (DRAPES) ×2 IMPLANT
DRAPE MICROSCOPE LEICA (MISCELLANEOUS) ×2 IMPLANT
DRAPE POUCH INSTRU U-SHP 10X18 (DRAPES) ×2 IMPLANT
DRAPE PROXIMA HALF (DRAPES) IMPLANT
DRILL NEURO 2X3.1 SOFT TOUCH (MISCELLANEOUS) ×2
DURAPREP 6ML APPLICATOR 50/CS (WOUND CARE) ×2 IMPLANT
ELECT COATED BLADE 2.86 ST (ELECTRODE) ×2 IMPLANT
ELECT REM PT RETURN 9FT ADLT (ELECTROSURGICAL) ×2
ELECTRODE REM PT RTRN 9FT ADLT (ELECTROSURGICAL) ×1 IMPLANT
GAUZE SPONGE 4X4 16PLY XRAY LF (GAUZE/BANDAGES/DRESSINGS) IMPLANT
GLOVE BIO SURGEON STRL SZ 6.5 (GLOVE) IMPLANT
GLOVE BIO SURGEON STRL SZ7 (GLOVE) IMPLANT
GLOVE BIO SURGEON STRL SZ7.5 (GLOVE) IMPLANT
GLOVE BIO SURGEON STRL SZ8 (GLOVE) ×2 IMPLANT
GLOVE BIO SURGEON STRL SZ8.5 (GLOVE) IMPLANT
GLOVE BIOGEL M 8.0 STRL (GLOVE) IMPLANT
GLOVE BIOGEL PI IND STRL 7.0 (GLOVE) ×1 IMPLANT
GLOVE BIOGEL PI INDICATOR 7.0 (GLOVE) ×1
GLOVE ECLIPSE 6.5 STRL STRAW (GLOVE) ×2 IMPLANT
GLOVE ECLIPSE 7.0 STRL STRAW (GLOVE) IMPLANT
GLOVE ECLIPSE 7.5 STRL STRAW (GLOVE) IMPLANT
GLOVE ECLIPSE 8.0 STRL XLNG CF (GLOVE) IMPLANT
GLOVE ECLIPSE 8.5 STRL (GLOVE) IMPLANT
GLOVE EXAM NITRILE LRG STRL (GLOVE) IMPLANT
GLOVE EXAM NITRILE MD LF STRL (GLOVE) IMPLANT
GLOVE EXAM NITRILE XL STR (GLOVE) IMPLANT
GLOVE EXAM NITRILE XS STR PU (GLOVE) IMPLANT
GLOVE INDICATOR 6.5 STRL GRN (GLOVE) IMPLANT
GLOVE INDICATOR 7.0 STRL GRN (GLOVE) IMPLANT
GLOVE INDICATOR 7.5 STRL GRN (GLOVE) IMPLANT
GLOVE INDICATOR 8.0 STRL GRN (GLOVE) IMPLANT
GLOVE INDICATOR 8.5 STRL (GLOVE) ×2 IMPLANT
GLOVE OPTIFIT SS 8.0 STRL (GLOVE) IMPLANT
GLOVE SURG SS PI 6.5 STRL IVOR (GLOVE) IMPLANT
GLOVE SURG SS PI 7.0 STRL IVOR (GLOVE) ×4 IMPLANT
GOWN BRE IMP SLV AUR LG STRL (GOWN DISPOSABLE) ×2 IMPLANT
GOWN BRE IMP SLV AUR XL STRL (GOWN DISPOSABLE) ×2 IMPLANT
GOWN STRL REIN 2XL LVL4 (GOWN DISPOSABLE) ×2 IMPLANT
KIT BASIN OR (CUSTOM PROCEDURE TRAY) ×2 IMPLANT
KIT ROOM TURNOVER OR (KITS) ×2 IMPLANT
NEEDLE HYPO 25X1 1.5 SAFETY (NEEDLE) ×2 IMPLANT
NEEDLE SPNL 22GX3.5 QUINCKE BK (NEEDLE) ×6 IMPLANT
NS IRRIG 1000ML POUR BTL (IV SOLUTION) ×2 IMPLANT
PACK LAMINECTOMY NEURO (CUSTOM PROCEDURE TRAY) ×2 IMPLANT
PAD ARMBOARD 7.5X6 YLW CONV (MISCELLANEOUS) ×6 IMPLANT
PIN DISTRACTION 14MM (PIN) ×4 IMPLANT
PLATE HELIX-R 24MM (Plate) ×2 IMPLANT
RUBBERBAND STERILE (MISCELLANEOUS) ×4 IMPLANT
SCREW HELIX R (Screw) ×8 IMPLANT
SPACER CC-ACF 8MM PARALLEL (Bone Implant) ×2 IMPLANT
SPONGE INTESTINAL PEANUT (DISPOSABLE) ×2 IMPLANT
SPONGE SURGIFOAM ABS GEL SZ50 (HEMOSTASIS) ×2 IMPLANT
SUT VIC AB 0 CT1 27 (SUTURE) ×1
SUT VIC AB 0 CT1 27XBRD ANTBC (SUTURE) ×1 IMPLANT
SUT VIC AB 3-0 SH 8-18 (SUTURE) ×4 IMPLANT
SYR 20ML ECCENTRIC (SYRINGE) ×2 IMPLANT
TOWEL OR 17X24 6PK STRL BLUE (TOWEL DISPOSABLE) ×2 IMPLANT
TOWEL OR 17X26 10 PK STRL BLUE (TOWEL DISPOSABLE) ×2 IMPLANT
WATER STERILE IRR 1000ML POUR (IV SOLUTION) ×2 IMPLANT

## 2013-04-15 NOTE — Anesthesia Procedure Notes (Signed)
Procedure Name: Intubation Date/Time: 04/15/2013 9:05 AM Performed by: Romie Minus K Pre-anesthesia Checklist: Patient identified, Emergency Drugs available, Suction available, Patient being monitored and Timeout performed Patient Re-evaluated:Patient Re-evaluated prior to inductionOxygen Delivery Method: Circle system utilized Preoxygenation: Pre-oxygenation with 100% oxygen Intubation Type: IV induction Ventilation: Mask ventilation without difficulty and Two handed mask ventilation required Grade View: Grade I Tube type: Oral Tube size: 7.5 mm Number of attempts: 1 Airway Equipment and Method: Stylet and Video-laryngoscopy Placement Confirmation: ETT inserted through vocal cords under direct vision,  positive ETCO2,  CO2 detector and breath sounds checked- equal and bilateral Secured at: 22 cm Tube secured with: Tape Dental Injury: Teeth and Oropharynx as per pre-operative assessment  Difficulty Due To: Difficulty was anticipated, Difficult Airway- due to reduced neck mobility and Difficult Airway- due to anterior larynx

## 2013-04-15 NOTE — Preoperative (Signed)
Beta Blockers   Reason not to administer Beta Blockers:Not Applicable 

## 2013-04-15 NOTE — Discharge Summary (Signed)
Physician Discharge Summary  Patient ID: KAEDEN MESTER MRN: [id number] DOB/AGE: [birth year] 37 y.o.  Admit date: 04/15/2013 Discharge date: 04/15/2013  Admission Diagnoses:Cervical spondylosis, cervical radiculopathy C5/6  Discharge Diagnoses: Cervical spondylosis, cervical radiculopathy C5/6 Active Problems:   * No active hospital problems. *   Discharged Condition: good  Hospital Course: Mr. Noffke was admitted and taken to the operating room where he underwent an uncomplicated ACDF using Nuvasive hardware at C5/6. Post op his voice is strong. He is ambulating, voiding, and moving all extremities well. The wound is clean, dry, and without signs of infection.  Consults: None  Significant Diagnostic Studies: none  Treatments: surgery: as above  Discharge Exam: Blood pressure 112/72, pulse 93, temperature 98.4 F (36.9 C), temperature source Oral, resp. rate 18, height 5' 8.11" (1.73 m), weight 120.5 kg (265 lb 10.5 oz), SpO2 100.00%. General appearance: alert, cooperative, appears stated age and no distress Neurologic: Alert and oriented X 3, normal strength and tone. Normal symmetric reflexes. Normal coordination and gait  Disposition: Final discharge disposition not confirmed     Medication List    STOP taking these medications       ibuprofen 600 MG tablet  Commonly known as:  ADVIL,MOTRIN      TAKE these medications       cyclobenzaprine 10 MG tablet  Commonly known as:  FLEXERIL  Take 1 tablet (10 mg total) by mouth 3 (three) times daily as needed for muscle spasms.     glimepiride 4 MG tablet  Commonly known as:  AMARYL  Take 4 mg by mouth daily before breakfast.     glucose blood test strip  Once daily, PRN Dx 250.00     HYDROcodone-acetaminophen 10-325 MG per tablet  Commonly known as:  NORCO  Take 1 tablet by mouth every 8 (eight) hours as needed for pain.     HYDROcodone-acetaminophen 5-325 MG per tablet  Commonly known as:  NORCO/VICODIN  Take 1  tablet by mouth every 6 (six) hours as needed for pain.     metFORMIN 1000 MG tablet  Commonly known as:  GLUCOPHAGE  Take 1 tablet (1,000 mg total) by mouth 2 (two) times daily with a meal.           Follow-up Information   Follow up with Taylon Louison L, MD In 3 weeks. (call to make appointment)    Contact information:   [address]                         [address] [place] [address] [phone]       Signed: Juvon Teater L 04/15/2013, 4:28 PM

## 2013-04-15 NOTE — Anesthesia Preprocedure Evaluation (Addendum)
Anesthesia Evaluation  Patient identified by MRN, date of birth, ID band Patient awake    Reviewed: Allergy & Precautions, H&P , NPO status , Patient's Chart, lab work & pertinent test results  History of Anesthesia Complications Negative for: history of anesthetic complications  Airway Mallampati: II TM Distance: <3 FB Neck ROM: Limited    Dental  (+) Teeth Intact and Dental Advisory Given   Pulmonary neg pulmonary ROS,  breath sounds clear to auscultation  Pulmonary exam normal       Cardiovascular Rhythm:Regular Rate:Normal  Borderline HTN per patient.  ASD/VSD corrective surgery @ 37 years old.   Neuro/Psych  Headaches,    GI/Hepatic negative GI ROS, Neg liver ROS,   Endo/Other  diabetes (glu 125), Well Controlled, Type 2, Oral Hypoglycemic Agents  Renal/GU negative Renal ROS     Musculoskeletal   Abdominal (+) + obese,   Peds  Hematology negative hematology ROS (+)   Anesthesia Other Findings   Reproductive/Obstetrics negative OB ROS                          Anesthesia Physical Anesthesia Plan  ASA: III  Anesthesia Plan: General   Post-op Pain Management:    Induction: Intravenous  Airway Management Planned: Oral ETT  Additional Equipment:   Intra-op Plan:   Post-operative Plan: Extubation in OR  Informed Consent: I have reviewed the patients History and Physical, chart, labs and discussed the procedure including the risks, benefits and alternatives for the proposed anesthesia with the patient or authorized representative who has indicated his/her understanding and acceptance.   Dental advisory given  Plan Discussed with: CRNA and Surgeon  Anesthesia Plan Comments: (Plan routine monitors, GETA)        Anesthesia Quick Evaluation

## 2013-04-15 NOTE — Anesthesia Postprocedure Evaluation (Signed)
  Anesthesia Post-op Note  Patient: Derrick West  Procedure(s) Performed: Procedure(s) with comments: ANTERIOR CERVICAL DECOMPRESSION/DISCECTOMY FUSION 1 LEVEL (N/A) - Cervical Five-Six Anterior Cervical decompression with fusion plating and bonegraft  Patient Location: PACU  Anesthesia Type:General  Level of Consciousness: awake, alert , oriented and patient cooperative  Airway and Oxygen Therapy: Patient Spontanous Breathing and Patient connected to nasal cannula oxygen  Post-op Pain: mild  Post-op Assessment: Post-op Vital signs reviewed, Patient's Cardiovascular Status Stable, Respiratory Function Stable, Patent Airway, No signs of Nausea or vomiting and Pain level controlled  Post-op Vital Signs: Reviewed and stable  Complications: No apparent anesthesia complications

## 2013-04-15 NOTE — H&P (Signed)
BP 139/90  Pulse 85  Temp(Src) 97.9 F (36.6 C) (Oral)  Resp 20  SpO2 97%  [name] comes in today for evaluation of pain he has in his neck, bilaterally in the shoulders and slightly worse in the left upper extremity.  He reports some weakness in his left upper extremity.  He says he is having difficulty sleeping and it is affecting his job.  He has had this pain since November.  He feels that it is worse now than it was then.  He always has a dull pain.  He has undergone X-rays and MRI and has had a Prednisone taper which did not help him.  He is a patient of Dr. Amador Cunas.  He says the pain is a 6-7/10.  If he extends his neck, he feels slightly better in his upper extremities.  He has taken Hydrocodone also along with Flexeril.  An MRI of the cervical spine showed problems at C5-6 and he is sent here for further evaluation.    He has been both to an Urgent Care and his primary care physician for treatment of this.  He just is no better at this time.    He said the date this started was on 08/13/2012.  He has numbness and tingling sometimes in his arms.   MEDICATIONS:    Metformin and Glimepiride.    ALLERGIES:    HE IS ALLERGIC TO AMOXICILLIN.  He said he does not know what happens but he has not had the medicine sine he was a child.  SOCIAL HISTORY:    He is 37 years of age and right-handed and works as a Aeronautical engineer.  He does not smoke.  He does drink alcohol.  He does not use illicit drugs.  He says he is approximately 5'8" tall and weighs 258 pounds.    PAST MEDICAL HISTORY:  Significant for diabetes.  FAMILY HISTORY:    Mother is 39 in good health and has asthma.  Father is 45 in good health with no medical problems.  Diabetes and hypertension are present in the Family History.     PAST SURGICAL HISTORY:  He had low back surgery done by Dr. Lovey Newcomer in 2003 at [facility].    REVIEW OF SYSTEMS:   Positive for eye glasses, arm weakness, arm pain, arthritis, neck  pain and diabetes.  He denies ears, nose, throat, mouth, cardiovascular, respiratory, GI, GU, skin, neurological, psychiatric, hematologic or allergic problems.    PHYSICAL EXAMINATION:  On examination, he is alert and oriented x four and answering all questions appropriately.  Memory, language, attention span, and fund of knowledge are normal.  He is well kempt and in no distress.  He has normal strength in the upper extremities, possible some slight weakness in the left upper extremity in the biceps.    Muscle tone, bulk and coordination are normal.  Gait is normal.  Reflexes are 2+ in the biceps, triceps, brachioradialis, knees and ankles.  Symmetric facies, symmetric facial sensation.  PERRL.  Tongue and uvula in the midline.  Shoulder shrug is normal.  Hearing is intact to finger rub bilaterally.    DIAGNOSTIC STUDIES:   MRI shows what is a disc herniation with osteophytes at C5-6 crowding the cord at that level.  No distortion of the cord but certainly crowding it.  Foraminal narrowing far worse on the left than the right. Cord signal may be altered, hard to say at this point but 5-6 is obviously the worst.  Alignment is straight with a loss of lordosis.  Paraspinous soft tissues are normal.    SUMMARY:     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   I believe this is the reason for his pain.

## 2013-04-15 NOTE — Plan of Care (Signed)
Problem: Consults Goal: Diagnosis - Spinal Surgery Outcome: Completed/Met Date Met:  04/15/13 Cervical Spine Fusion     

## 2013-04-15 NOTE — Transfer of Care (Signed)
Immediate Anesthesia Transfer of Care Note  Patient: Derrick West  Procedure(s) Performed: Procedure(s) with comments: ANTERIOR CERVICAL DECOMPRESSION/DISCECTOMY FUSION 1 LEVEL (N/A) - Cervical Five-Six Anterior Cervical decompression with fusion plating and bonegraft  Patient Location: PACU  Anesthesia Type:General  Level of Consciousness: awake, oriented and patient cooperative  Airway & Oxygen Therapy: Patient Spontanous Breathing and Patient connected to nasal cannula oxygen  Post-op Assessment: Report given to PACU RN and Post -op Vital signs reviewed and stable  Post vital signs: Reviewed and stable  Complications: No apparent anesthesia complications

## 2013-04-15 NOTE — Op Note (Signed)
04/15/2013  11:39 AM  PATIENT:  Derrick West  37 y.o. 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 was taken to the operating room, intubated, and placed under a general anesthestic. He was positioned supine with his head placed on a horseshoe headrest, with traction (5lbs)applied via a chin strap. His neck was prepped and draped in a sterile manner. I infiltrated 5cc lidcocaine into the cervical region starting at the midline and extending to the medial border of the sternocleidomastoid muscle on the left side. I opened the incision with a 10 blade, and dissected down to the platysma. I opened the platysma with Metzenbaum scissors horizontally. I dissected both sharply and bluntly to expose the cervical spine. An intraoperative xray showed spinal needles to be at C4/5, and C5/6. I reflected the longus colli at C5/6, bilaterally with bipolar cautery, then placed self retaining retractors. I opened the disc space with a 15 blade then proceeded with the discetomy and cervical decompression.  I decompressed the spinal canal at C5/6 with a combination of curettes, rongeurs, punches and the drill. I removed osteophytes, and opened the canal. I removed the posterior longitudinal ligament to free the spinal cord and dura. I took down the uncovertebral joints to free the C 6 roots bilaterally. With the decompression  complete I moved on to the arthrodesis. I prepared the vertebral bodies with the drill and curettes. I measured the space and felt an 8mm graft would work best. I placed the graft without difficulty. I achieved hemostasis.  I with Dr. Wynetta Emery placed  a 24mm Helix plate. I placed 4 screws, two in each body.  We closed the wound in layers with vicryl sutures approximating the platysma, and the subcuticular layer. I used dermabond for a sterile dressing.   PLAN OF CARE: Admit to inpatient   PATIENT DISPOSITION:  PACU - hemodynamically stable.   Delay start of Pharmacological VTE agent (>24hrs) due to surgical blood loss or risk of bleeding:  yes

## 2013-04-16 ENCOUNTER — Encounter (HOSPITAL_COMMUNITY): Payer: Self-pay | Admitting: Neurosurgery

## 2013-04-16 LAB — GLUCOSE, CAPILLARY: Glucose-Capillary: 176 mg/dL — ABNORMAL HIGH (ref 70–99)

## 2013-04-16 NOTE — Progress Notes (Signed)
Pt and wife given D/C instructions with verbal understanding. Dr. Franky Macho gave Rx's to Pt yesterday. Pt D/C'd home via wheelchair @ [phone] per MD order. Rema Fendt, RN

## 2013-07-30 ENCOUNTER — Other Ambulatory Visit: Payer: Self-pay

## 2014-07-27 ENCOUNTER — Encounter: Payer: Self-pay | Admitting: Internal Medicine

## 2014-07-27 ENCOUNTER — Ambulatory Visit (INDEPENDENT_AMBULATORY_CARE_PROVIDER_SITE_OTHER): Payer: 59 | Admitting: Internal Medicine

## 2014-07-27 VITALS — BP 140/90 | HR 85 | Temp 98.5°F | Resp 20 | Ht 68.0 in | Wt 223.0 lb

## 2014-07-27 DIAGNOSIS — L309 Dermatitis, unspecified: Secondary | ICD-10-CM

## 2014-07-27 DIAGNOSIS — E119 Type 2 diabetes mellitus without complications: Secondary | ICD-10-CM

## 2014-07-27 MED ORDER — TRIAMCINOLONE ACETONIDE 0.1 % EX CREA: 1.0000 "application " | TOPICAL_CREAM | Freq: Two times a day (BID) | CUTANEOUS | Status: DC

## 2014-07-27 MED ORDER — GLUCOSE BLOOD VI STRP: ORAL_STRIP | Status: DC

## 2014-07-27 MED ORDER — METFORMIN HCL 1000 MG PO TABS: 1000.0000 mg | ORAL_TABLET | Freq: Two times a day (BID) | ORAL | Status: DC

## 2014-07-27 MED ORDER — GLIMEPIRIDE 4 MG PO TABS: 4.0000 mg | ORAL_TABLET | Freq: Every day | ORAL | Status: DC

## 2014-07-27 NOTE — Progress Notes (Signed)
   Subjective:    Patient ID: Derrick West, male    DOB: [birth year], 38 y.o.   MRN: [id number]  HPI  38 year old patient who has not been seen in some time.  He does have a history of type 2 diabetes, formerly controlled on oral medications.  He has been off medications for greater than 6 months.  He does check home blood sugars and has maintained nice glycemic control. History complaint is a rash involving both ears as well as the dorsal aspect of the left foot.  This has been refractory to a number of OTC medications.  Symptoms have been present for about 6 months and have been intermittent Past Medical History  Diagnosis Date  . DIABETES MELLITUS, TYPE II 12/24/2007  . GANGLION CYST, WRIST, LEFT 07/29/2007  . OBESITY 12/24/2007  . Headache(784.0)     History   Social History  . Marital Status: Married    Spouse Name: N/A    Number of Children: N/A  . Years of Education: N/A   Occupational History  . Not on file.   Social History Main Topics  . Smoking status: Never Smoker   . Smokeless tobacco: Not on file  . Alcohol Use: Yes     Comment: occ  . Drug Use: No  . Sexual Activity: Not on file   Other Topics Concern  . Not on file   Social History Narrative    Past Surgical History  Procedure Laterality Date  . Lumbar laminectomy    . Vsd repair    . Anterior cervical decomp/discectomy fusion N/A 04/15/2013    Procedure: ANTERIOR CERVICAL DECOMPRESSION/DISCECTOMY FUSION 1 LEVEL;  Surgeon: Winfield Cunas, MD;  Location: [facility];  Service: Neurosurgery;  Laterality: N/A;  Cervical Five-Six Anterior Cervical decompression with fusion plating and bonegraft    History reviewed. No pertinent family history.  Allergies  Allergen Reactions  . Amoxicillin     As a child    No current outpatient prescriptions on file prior to visit.   No current facility-administered medications on file prior to visit.    BP 140/90 mmHg  Pulse 85  Temp(Src) 98.5 F (36.9 C) (Oral)   Resp 20  Ht 5\' 8"  (1.727 m)  Wt 223 lb (101.152 kg)  BMI 33.91 kg/m2  SpO2 98%     Review of Systems  Skin: Positive for rash.       Objective:   Physical Exam  Skin:  Erythema and some slight scaling involving the superior aspect of the ear at the junction of the ear to the scalp The patient wears glasses  Patchy area of scaly dermatitis approximately 4 cm involving the dorsal aspect of the left lateral foot          Assessment & Plan:   Nonspecific dermatitis.  Will treat with short-term triamcinolone as well as a topical antifungal and observe Diabetes mellitus.  Random blood sugar today 95.  Will check a hemoglobin A1c.  Schedule CPX in 3 months

## 2014-07-27 NOTE — Patient Instructions (Addendum)
Please check your hemoglobin A1c every 3 months  Call or return to clinic prn if these symptoms worsen or fail to improve as anticipated.  

## 2014-07-27 NOTE — Progress Notes (Signed)
Pre visit review using our clinic review tool, if applicable. No additional management support is needed unless otherwise documented below in the visit note. 

## 2014-07-28 LAB — HEMOGLOBIN A1C: Hgb A1c MFr Bld: 6 % (ref 4.6–6.5)

## 2014-12-23 ENCOUNTER — Ambulatory Visit (INDEPENDENT_AMBULATORY_CARE_PROVIDER_SITE_OTHER): Payer: 59 | Admitting: Internal Medicine

## 2014-12-23 ENCOUNTER — Encounter: Payer: Self-pay | Admitting: Internal Medicine

## 2014-12-23 VITALS — BP 120/70 | HR 87 | Temp 98.0°F | Resp 18 | Ht 68.0 in | Wt 191.0 lb

## 2014-12-23 DIAGNOSIS — F4323 Adjustment disorder with mixed anxiety and depressed mood: Secondary | ICD-10-CM

## 2014-12-23 MED ORDER — TRIAMCINOLONE ACETONIDE 0.1 % EX CREA: 1.0000 "application " | TOPICAL_CREAM | Freq: Two times a day (BID) | CUTANEOUS | Status: DC

## 2014-12-23 MED ORDER — LORAZEPAM 0.5 MG PO TABS
0.5000 mg | ORAL_TABLET | Freq: Two times a day (BID) | ORAL | Status: DC | PRN
Start: 2014-12-23 — End: 2015-12-27

## 2014-12-23 MED ORDER — ESCITALOPRAM OXALATE 10 MG PO TABS: 10.0000 mg | ORAL_TABLET | Freq: Every day | ORAL | Status: DC

## 2014-12-23 NOTE — Patient Instructions (Signed)
Behavioral health referral as discussed  Return in 6 weeks for follow-up

## 2014-12-23 NOTE — Progress Notes (Signed)
Pre visit review using our clinic review tool, if applicable. No additional management support is needed unless otherwise documented below in the visit note. 

## 2014-12-23 NOTE — Progress Notes (Signed)
Subjective:    Patient ID: Derrick West, male    DOB: 27-Nov-1975, 39 y.o.   MRN: [id number]  HPI 39 year old patient who presents with complaints of increase in stress and anxiety.  Stressors have included the job loss of his wife.  His mother-in-law has also moved in.  He describes worsening anxiety, poor sleep and easy irritability.  He states that he becomes easily agitated but uses able to calm himself down.  Presently the agitation last for more prolonged periods.  He describes mood swings.  At times he feels well and then becomes depressed.  He states that he has trouble sleeping due to his mind racing and unable to control intrusive thoughts.  Symptoms have been present for about 8 months.  Doubt true mania  Past Medical History  Diagnosis Date  . DIABETES MELLITUS, TYPE II 12/24/2007  . GANGLION CYST, WRIST, LEFT 07/29/2007  . OBESITY 12/24/2007  . Headache(784.0)     History   Social History  . Marital Status: Married    Spouse Name: N/A  . Number of Children: N/A  . Years of Education: N/A   Occupational History  . Not on file.   Social History Main Topics  . Smoking status: Never Smoker   . Smokeless tobacco: Not on file  . Alcohol Use: Yes     Comment: occ  . Drug Use: No  . Sexual Activity: Not on file   Other Topics Concern  . Not on file   Social History Narrative    Past Surgical History  Procedure Laterality Date  . Lumbar laminectomy    . Vsd repair    . Anterior cervical decomp/discectomy fusion N/A 04/15/2013    Procedure: ANTERIOR CERVICAL DECOMPRESSION/DISCECTOMY FUSION 1 LEVEL;  Surgeon: Winfield Cunas, MD;  Location: [facility];  Service: Neurosurgery;  Laterality: N/A;  Cervical Five-Six Anterior Cervical decompression with fusion plating and bonegraft    No family history on file.  Allergies  Allergen Reactions  . Amoxicillin     As a child    Current Outpatient Prescriptions on File Prior to Visit  Medication Sig Dispense Refill  .  glucose blood test strip Once daily, PRN Dx 250.00 100 each 6  . Multiple Vitamins-Minerals (CENTRUM ADULTS PO) Take 1 tablet by mouth daily.     No current facility-administered medications on file prior to visit.    BP 120/70 mmHg  Pulse 87  Temp(Src) 98 F (36.7 C) (Oral)  Resp 18  Ht 5\' 8"  (1.727 m)  Wt 191 lb (86.637 kg)  BMI 29.05 kg/m2  SpO2 98%      Review of Systems  Constitutional: Negative for fever, chills, appetite change and fatigue.  HENT: Negative for congestion, dental problem, ear pain, hearing loss, sore throat, tinnitus, trouble swallowing and voice change.   Eyes: Negative for pain, discharge and visual disturbance.  Respiratory: Negative for cough, chest tightness, wheezing and stridor.   Cardiovascular: Negative for chest pain, palpitations and leg swelling.  Gastrointestinal: Negative for nausea, vomiting, abdominal pain, diarrhea, constipation, blood in stool and abdominal distention.  Genitourinary: Negative for urgency, hematuria, flank pain, discharge, difficulty urinating and genital sores.  Musculoskeletal: Negative for myalgias, back pain, joint swelling, arthralgias, gait problem and neck stiffness.  Skin: Negative for rash.  Neurological: Negative for dizziness, syncope, speech difficulty, weakness, numbness and headaches.  Hematological: Negative for adenopathy. Does not bruise/bleed easily.  Psychiatric/Behavioral: Positive for sleep disturbance, dysphoric mood and agitation. Negative for behavioral problems.  The patient is nervous/anxious.        Objective:   Physical Exam  Constitutional: He appears well-developed and well-nourished. No distress.  Blood pressure low normal  Psychiatric: He has a normal mood and affect. His behavior is normal.  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  Will place on Lexapro  and lorazepam when necessary sleep Recheck 6 weeks

## 2015-02-03 ENCOUNTER — Ambulatory Visit: Payer: 59 | Admitting: Internal Medicine

## 2015-02-16 ENCOUNTER — Ambulatory Visit: Payer: 59 | Admitting: Internal Medicine

## 2015-03-15 ENCOUNTER — Encounter: Payer: Self-pay | Admitting: Family Medicine

## 2015-03-15 ENCOUNTER — Encounter: Payer: Self-pay | Admitting: *Deleted

## 2015-03-15 ENCOUNTER — Ambulatory Visit (INDEPENDENT_AMBULATORY_CARE_PROVIDER_SITE_OTHER): Payer: 59 | Admitting: Family Medicine

## 2015-03-15 VITALS — BP 124/80 | HR 87 | Temp 98.2°F | Ht 68.0 in | Wt 190.5 lb

## 2015-03-15 DIAGNOSIS — H109 Unspecified conjunctivitis: Secondary | ICD-10-CM

## 2015-03-15 DIAGNOSIS — J029 Acute pharyngitis, unspecified: Secondary | ICD-10-CM

## 2015-03-15 DIAGNOSIS — J069 Acute upper respiratory infection, unspecified: Secondary | ICD-10-CM | POA: Diagnosis not present

## 2015-03-15 LAB — POCT RAPID STREP A (OFFICE): Rapid Strep A Screen: NEGATIVE

## 2015-03-15 MED ORDER — ERYTHROMYCIN 5 MG/GM OP OINT: 1.0000 "application " | TOPICAL_OINTMENT | Freq: Every day | OPHTHALMIC | Status: DC

## 2015-03-15 MED ORDER — HYDROCODONE-HOMATROPINE 5-1.5 MG/5ML PO SYRP: 5.0000 mL | ORAL_SOLUTION | Freq: Three times a day (TID) | ORAL | Status: DC | PRN

## 2015-03-15 NOTE — Patient Instructions (Signed)
BEFORE  YOU LEAVE: -letter to return to work tomorrow or when fever <100 for 24 hours  INSTRUCTIONS FOR UPPER RESPIRATORY INFECTION:  -plenty of rest and fluids  -nasal saline wash 2-3 times daily (use prepackaged nasal saline or bottled/distilled water if making your own)   -can use AFRIN nasal spray for drainage and nasal congestion - but do NOT use longer then 3-4 days  -can use tylenol (in no history of liver disease) or ibuprofen (if no history of kidney disease, bowel bleeding or significant heart disease) as directed for aches and sorethroat  -in the winter time, using a humidifier at night is helpful (please follow cleaning instructions)  -if you are taking a cough medication - use only as directed, may also try a teaspoon of honey to coat the throat and throat lozenges. If given a cough medication with codeine or hydrocodone or other narcotic please be advised that this contains a strong and  potentially addicting medication. Please follow instructions carefully, take as little as possible and only use AS NEEDED for severe cough. Discuss potential side effects with your pharmacy. Please do not drive or operate machinery while taking these types of medications. Please do not take other sedating medications, drugs or alcohol while taking this medication without discussing with your doctor.  -for sore throat, salt water gargles can help  -follow up if you have fevers, facial pain, tooth pain, difficulty breathing or are worsening or symptoms persist longer then expected  Upper Respiratory Infection, Adult An upper respiratory infection (URI) is also known as the common cold. It is often caused by a type of germ (virus). Colds are easily spread (contagious). You can pass it to others by kissing, coughing, sneezing, or drinking out of the same glass. Usually, you get better in 1 to 3  weeks.  However, the cough can last for even longer. HOME CARE   Only take medicine as told by your  doctor. Follow instructions provided above.  Drink enough water and fluids to keep your pee (urine) clear or pale yellow.  Get plenty of rest.  Return to work when your temperature is < 100 for 24 hours or as told by your doctor. You may use a face mask and wash your hands to stop your cold from spreading. GET HELP RIGHT AWAY IF:   After the first few days, you feel you are getting worse.  You have questions about your medicine.  You have chills, shortness of breath, or red spit (mucus).  You have pain in the face for more then 1-2 days, especially when you bend forward.  You have a fever, puffy (swollen) neck, pain when you swallow, or white spots in the back of your throat.  You have a bad headache, ear pain, sinus pain, or chest pain.  You have a high-pitched whistling sound when you breathe in and out (wheezing).  You cough up blood.  You have sore muscles or a stiff neck. MAKE SURE YOU:   Understand these instructions.  Will watch your condition.  Will get help right away if you are not doing well or get worse. Document Released: 02/27/2008 Document Revised: 12/03/2011 Document Reviewed: 12/16/2013 [employer] Patient Information 2015 [place], [place]. This information is not intended to replace advice given to you by your health care provider. Make sure you discuss any questions you have with your health care provider.

## 2015-03-15 NOTE — Progress Notes (Signed)
HPI:  Sore throat: -started:3 days ago -symptoms:nasal congestion, lots of PND, sore throat, cough - worse at night, HA, eyes irritated eyes with clear drainage L, low grade fever initially -denies:fever, SOB, NVD, tooth pain, vision changes -has tried: cough medicine OTC -sick contacts/travel/risks: denies flu exposure, tick exposure or or Ebola risks -daughter dx with strep last week  ROS: See pertinent positives and negatives per HPI.  Past Medical History  Diagnosis Date  . DIABETES MELLITUS, TYPE II 12/24/2007  . GANGLION CYST, WRIST, LEFT 07/29/2007  . OBESITY 12/24/2007  . LOVFIEPP(295.1)     Past Surgical History  Procedure Laterality Date  . Lumbar laminectomy    . Vsd repair    . Anterior cervical decomp/discectomy fusion N/A 04/15/2013    Procedure: ANTERIOR CERVICAL DECOMPRESSION/DISCECTOMY FUSION 1 LEVEL;  Surgeon: Winfield Cunas, MD;  Location: [facility];  Service: Neurosurgery;  Laterality: N/A;  Cervical Five-Six Anterior Cervical decompression with fusion plating and bonegraft    No family history on file.  History   Social History  . Marital Status: Married    Spouse Name: N/A  . Number of Children: N/A  . Years of Education: N/A   Social History Main Topics  . Smoking status: Never Smoker   . Smokeless tobacco: Not on file  . Alcohol Use: Yes     Comment: occ  . Drug Use: No  . Sexual Activity: Not on file   Other Topics Concern  . None   Social History Narrative     Current outpatient prescriptions:  .  escitalopram (LEXAPRO) 10 MG tablet, Take 1 tablet (10 mg total) by mouth daily., Disp: 60 tablet, Rfl: 2 .  glucose blood test strip, Once daily, PRN Dx 250.00, Disp: 100 each, Rfl: 6 .  LORazepam (ATIVAN) 0.5 MG tablet, Take 1 tablet (0.5 mg total) by mouth 2 (two) times daily as needed for anxiety., Disp: 30 tablet, Rfl: 1 .  Multiple Vitamins-Minerals (CENTRUM ADULTS PO), Take 1 tablet by mouth daily., Disp: , Rfl:  .  triamcinolone  cream (KENALOG) 0.1 %, Apply 1 application topically 2 (two) times daily., Disp: 30 g, Rfl: 2 .  erythromycin ophthalmic ointment, Place 1 application into the left eye at bedtime., Disp: 3.5 g, Rfl: 0 .  HYDROcodone-homatropine (HYCODAN) 5-1.5 MG/5ML syrup, Take 5 mLs by mouth every 8 (eight) hours as needed for cough., Disp: 120 mL, Rfl: 0  EXAM:  Filed Vitals:   03/15/15 1404  BP: 124/80  Pulse: 87  Temp: 98.2 F (36.8 C)    Body mass index is 28.97 kg/(m^2).  GENERAL: vitals reviewed and listed above, alert, oriented, appears well hydrated and in no acute distress  HEENT: atraumatic, mild conjunctival erythema L, visual acuity grossly intact,no obvious abnormalities on inspection of external nose and ears, normal appearance of ear canals and TMs, clear nasal congestion, mild post oropharyngeal erythema with PND, no tonsillar edema or exudate, no sinus TTP  NECK: no obvious masses on inspection  LUNGS: clear to auscultation bilaterally, no wheezes, rales or rhonchi, good air movement  CV: HRRR, no peripheral edema  MS: moves all extremities without noticeable abnormality  PSYCH: pleasant and cooperative, no obvious depression or anxiety  ASSESSMENT AND PLAN:  Discussed the following assessment and plan:  Sore throat - Plan: POC Rapid Strep A  Acute upper respiratory infection - Plan: HYDROcodone-homatropine (HYCODAN) 5-1.5 MG/5ML syrup  Conjunctivitis of left eye - Plan: erythromycin ophthalmic ointment  -given HPI and exam findings today,  a serious infection or illness is unlikely. We discussed potential etiologies, with VURI being most likely, and advised supportive care and monitoring. We discussed treatment side effects, likely course, antibiotic misuse, transmission, and signs of developing a serious illness. -suspect viral conjunctivitis part of URI but he request abd and reports his work requires tx to return to work -rapid strep neg, culture pending given exposure  the symptoms and exam suggest this is not strep pharyngitis -of course, we advised to return or notify a doctor immediately if symptoms worsen or persist or new concerns arise.    Patient Instructions  BEFORE  YOU LEAVE: -letter to return to work tomorrow or when fever <100 for 24 hours  INSTRUCTIONS FOR UPPER RESPIRATORY INFECTION:  -plenty of rest and fluids  -nasal saline wash 2-3 times daily (use prepackaged nasal saline or bottled/distilled water if making your own)   -can use AFRIN nasal spray for drainage and nasal congestion - but do NOT use longer then 3-4 days  -can use tylenol (in no history of liver disease) or ibuprofen (if no history of kidney disease, bowel bleeding or significant heart disease) as directed for aches and sorethroat  -in the winter time, using a humidifier at night is helpful (please follow cleaning instructions)  -if you are taking a cough medication - use only as directed, may also try a teaspoon of honey to coat the throat and throat lozenges. If given a cough medication with codeine or hydrocodone or other narcotic please be advised that this contains a strong and  potentially addicting medication. Please follow instructions carefully, take as little as possible and only use AS NEEDED for severe cough. Discuss potential side effects with your pharmacy. Please do not drive or operate machinery while taking these types of medications. Please do not take other sedating medications, drugs or alcohol while taking this medication without discussing with your doctor.  -for sore throat, salt water gargles can help  -follow up if you have fevers, facial pain, tooth pain, difficulty breathing or are worsening or symptoms persist longer then expected  Upper Respiratory Infection, Adult An upper respiratory infection (URI) is also known as the common cold. It is often caused by a type of germ (virus). Colds are easily spread (contagious). You can pass it to others  by kissing, coughing, sneezing, or drinking out of the same glass. Usually, you get better in 1 to 3  weeks.  However, the cough can last for even longer. HOME CARE   Only take medicine as told by your doctor. Follow instructions provided above.  Drink enough water and fluids to keep your pee (urine) clear or pale yellow.  Get plenty of rest.  Return to work when your temperature is < 100 for 24 hours or as told by your doctor. You may use a face mask and wash your hands to stop your cold from spreading. GET HELP RIGHT AWAY IF:   After the first few days, you feel you are getting worse.  You have questions about your medicine.  You have chills, shortness of breath, or red spit (mucus).  You have pain in the face for more then 1-2 days, especially when you bend forward.  You have a fever, puffy (swollen) neck, pain when you swallow, or white spots in the back of your throat.  You have a bad headache, ear pain, sinus pain, or chest pain.  You have a high-pitched whistling sound when you breathe in and out (wheezing).  You cough  up blood.  You have sore muscles or a stiff neck. MAKE SURE YOU:   Understand these instructions.  Will watch your condition.  Will get help right away if you are not doing well or get worse. Document Released: 02/27/2008 Document Revised: 12/03/2011 Document Reviewed: 12/16/2013 [facility] Patient Information 2015 [place], [place]. This information is not intended to replace advice given to you by your health care provider. Make sure you discuss any questions you have with your health care provider.      Colin Benton R.

## 2015-03-15 NOTE — Progress Notes (Signed)
Pre visit review using our clinic review tool, if applicable. No additional management support is needed unless otherwise documented below in the visit note. 

## 2015-03-15 NOTE — Addendum Note (Signed)
Addended by: Agnes Lawrence on: 03/15/2015 02:39 PM   Modules accepted: Orders

## 2015-03-17 LAB — CULTURE, GROUP A STREP: Organism ID, Bacteria: NORMAL

## 2015-11-23 ENCOUNTER — Other Ambulatory Visit: Payer: Self-pay | Admitting: Occupational Medicine

## 2015-11-23 ENCOUNTER — Ambulatory Visit: Payer: Self-pay

## 2015-11-23 DIAGNOSIS — Z0000 Encounter for general adult medical examination without abnormal findings: Secondary | ICD-10-CM

## 2015-12-23 ENCOUNTER — Ambulatory Visit: Payer: 59 | Admitting: Internal Medicine

## 2015-12-27 ENCOUNTER — Encounter: Payer: Self-pay | Admitting: Internal Medicine

## 2015-12-27 ENCOUNTER — Ambulatory Visit (INDEPENDENT_AMBULATORY_CARE_PROVIDER_SITE_OTHER): Payer: 59 | Admitting: Internal Medicine

## 2015-12-27 VITALS — BP 140/94 | HR 93 | Temp 98.8°F | Resp 20 | Ht 68.0 in | Wt 243.0 lb

## 2015-12-27 DIAGNOSIS — E119 Type 2 diabetes mellitus without complications: Secondary | ICD-10-CM | POA: Diagnosis not present

## 2015-12-27 MED ORDER — DULAGLUTIDE 0.75 MG/0.5ML ~~LOC~~ SOAJ: 0.7500 mg | SUBCUTANEOUS | Status: DC

## 2015-12-27 MED ORDER — CANAGLIFLOZIN 300 MG PO TABS: 300.0000 mg | ORAL_TABLET | Freq: Every day | ORAL | Status: DC

## 2015-12-27 MED ORDER — METFORMIN HCL 1000 MG PO TABS: 1000.0000 mg | ORAL_TABLET | Freq: Two times a day (BID) | ORAL | Status: DC

## 2015-12-27 NOTE — Patient Instructions (Signed)
Return in 4 weeks for follow-up  You need to lose weight.  Consider a lower calorie diet and regular exercise.    It is important that you exercise regularly, at least 20 minutes 3 to 4 times per week.  If you develop chest pain or shortness of breath seek  medical attention.

## 2015-12-27 NOTE — Progress Notes (Signed)
Subjective:    Patient ID: Derrick West, male    DOB: 1976-04-28, 40 y.o.   MRN: [id number]  HPI  Lab Results  Component Value Date   HGBA1C 6.0 07/27/2014    Wt Readings from Last 3 Encounters:  12/27/15 243 lb (110.224 kg)  03/15/15 190 lb 8 oz (86.41 kg)  12/23/14 191 lb (86.55 kg)    40 year old patient who has a history of type 2 diabetes.  He had been on metformin therapy in the past and his last hemoglobin A1c was 6.0.  He has discontinued metformin therapy.  No recent home blood sugar monitoring.  He went for an employment physical.  Random blood sugar was in excess of 300 and hemoglobin A1c 11 point 2.  There is been weight gain in excess of 50 pounds over the past 10 months.  He has been quite active due to chronic neck pain. Denies any hyperglycemic symptoms.  Past Medical History  Diagnosis Date  . DIABETES MELLITUS, TYPE II 12/24/2007  . GANGLION CYST, WRIST, LEFT 07/29/2007  . OBESITY 12/24/2007  . Headache(784.0)     Social History   Social History  . Marital Status: Married    Spouse Name: N/A  . Number of Children: N/A  . Years of Education: N/A   Occupational History  . Not on file.   Social History Main Topics  . Smoking status: Never Smoker   . Smokeless tobacco: Not on file  . Alcohol Use: Yes     Comment: occ  . Drug Use: No  . Sexual Activity: Not on file   Other Topics Concern  . Not on file   Social History Narrative    Past Surgical History  Procedure Laterality Date  . Lumbar laminectomy    . Vsd repair    . Anterior cervical decomp/discectomy fusion N/A 04/15/2013    Procedure: ANTERIOR CERVICAL DECOMPRESSION/DISCECTOMY FUSION 1 LEVEL;  Surgeon: Winfield Cunas, MD;  Location: [facility];  Service: Neurosurgery;  Laterality: N/A;  Cervical Five-Six Anterior Cervical decompression with fusion plating and bonegraft    No family history on file.  Allergies  Allergen Reactions  . Amoxicillin     As a child    Current Outpatient  Prescriptions on File Prior to Visit  Medication Sig Dispense Refill  . Multiple Vitamins-Minerals (CENTRUM ADULTS PO) Take 1 tablet by mouth daily.     No current facility-administered medications on file prior to visit.    BP 140/94 mmHg  Pulse 93  Temp(Src) 98.8 F (37.1 C) (Oral)  Resp 20  Ht 5\' 8"  (1.727 m)  Wt 243 lb (110.224 kg)  BMI 36.96 kg/m2  SpO2 98%       Review of Systems  Constitutional: Positive for unexpected weight change. Negative for fever, chills, appetite change and fatigue.  HENT: Negative for congestion, dental problem, ear pain, hearing loss, sore throat, tinnitus, trouble swallowing and voice change.   Eyes: Negative for pain, discharge and visual disturbance.  Respiratory: Negative for cough, chest tightness, wheezing and stridor.   Cardiovascular: Negative for chest pain, palpitations and leg swelling.  Gastrointestinal: Negative for nausea, vomiting, abdominal pain, diarrhea, constipation, blood in stool and abdominal distention.  Genitourinary: Negative for urgency, hematuria, flank pain, discharge, difficulty urinating and genital sores.  Musculoskeletal: Positive for neck pain. Negative for myalgias, back pain, joint swelling, arthralgias, gait problem and neck stiffness.  Skin: Negative for rash.  Neurological: Negative for dizziness, syncope, speech difficulty, weakness, numbness and  headaches.  Hematological: Negative for adenopathy. Does not bruise/bleed easily.  Psychiatric/Behavioral: Negative for behavioral problems and dysphoric mood. The patient is not nervous/anxious.        Objective:   Physical Exam  Constitutional: He is oriented to person, place, and time. He appears well-developed.  Obese  HENT:  Head: Normocephalic.  Right Ear: External ear normal.  Left Ear: External ear normal.  Eyes: Conjunctivae and EOM are normal.  Neck: Normal range of motion.  Cardiovascular: Normal rate and normal heart sounds.   Pulmonary/Chest:  Breath sounds normal.  Abdominal: Bowel sounds are normal.  Musculoskeletal: Normal range of motion. He exhibits no edema or tenderness.  Neurological: He is alert and oriented to person, place, and time.  Psychiatric: He has a normal mood and affect. His behavior is normal.          Assessment & Plan:   Uncontrolled diabetes Obesity with significant weight gain Chronic neck pain.  Follow-up neurosurgery  The patient will be resumed on metformin therapy.  Home blood sugar monitoring.  Encouraged He will be placed on triple therapy.  He will check with his insurance coverage.  Samples provided Recheck in 3 months. Weight loss encouraged.  He states that he feels he can do this on his own but will consider dietary referral if there is no significant weight loss Recheck 4 weeks

## 2015-12-27 NOTE — Progress Notes (Signed)
Pre visit review using our clinic review tool, if applicable. No additional management support is needed unless otherwise documented below in the visit note. 

## 2016-01-24 ENCOUNTER — Ambulatory Visit: Payer: 59 | Admitting: Internal Medicine

## 2016-02-10 ENCOUNTER — Ambulatory Visit: Payer: 59 | Admitting: Internal Medicine

## 2016-04-27 ENCOUNTER — Other Ambulatory Visit: Payer: Self-pay | Admitting: Internal Medicine

## 2016-04-27 NOTE — Telephone Encounter (Signed)
Pt has a new meter one touch verio and needs test strips [facility] to [facility] [address]. Pt is testing once a day

## 2016-04-30 MED ORDER — GLUCOSE BLOOD VI STRP: ORAL_STRIP | 0 refills | Status: DC

## 2016-04-30 NOTE — Telephone Encounter (Signed)
Rx sent in

## 2016-05-09 ENCOUNTER — Other Ambulatory Visit: Payer: Self-pay | Admitting: Internal Medicine

## 2016-05-11 ENCOUNTER — Ambulatory Visit: Payer: Self-pay | Admitting: Internal Medicine

## 2016-06-09 ENCOUNTER — Other Ambulatory Visit: Payer: Self-pay | Admitting: Internal Medicine

## 2016-09-06 ENCOUNTER — Other Ambulatory Visit: Payer: Self-pay | Admitting: Internal Medicine

## 2016-09-27 ENCOUNTER — Other Ambulatory Visit: Payer: Self-pay | Admitting: Internal Medicine

## 2016-10-31 ENCOUNTER — Other Ambulatory Visit: Payer: Self-pay | Admitting: Internal Medicine

## 2016-11-12 ENCOUNTER — Telehealth: Payer: Self-pay | Admitting: Internal Medicine

## 2016-11-12 DIAGNOSIS — E119 Type 2 diabetes mellitus without complications: Secondary | ICD-10-CM

## 2016-11-12 NOTE — Telephone Encounter (Signed)
Okay for lab Also order CBC, lipid profile CMP Please schedule annual clinical exam

## 2016-11-12 NOTE — Telephone Encounter (Signed)
lmom for pt to call the office back to make a appointment for a CPE

## 2016-11-12 NOTE — Telephone Encounter (Signed)
lmom for pt to call the office to schedule appt for CPE.

## 2016-11-12 NOTE — Telephone Encounter (Signed)
See message below, please advise.

## 2016-11-12 NOTE — Telephone Encounter (Signed)
Pt would like to have the following  Urine Microalbumin  Hemoglobin A1c   May I have orders for labs

## 2016-11-12 NOTE — Telephone Encounter (Signed)
Lab orders placed in Epic; 

## 2016-11-14 NOTE — Telephone Encounter (Signed)
Pt has an appointment on Monday 11/19/16.

## 2016-11-19 ENCOUNTER — Encounter: Payer: Self-pay | Admitting: Family Medicine

## 2016-11-19 ENCOUNTER — Ambulatory Visit (INDEPENDENT_AMBULATORY_CARE_PROVIDER_SITE_OTHER): Payer: 59 | Admitting: Family Medicine

## 2016-11-19 VITALS — BP 118/84 | HR 92 | Temp 98.0°F | Wt 243.0 lb

## 2016-11-19 DIAGNOSIS — E119 Type 2 diabetes mellitus without complications: Secondary | ICD-10-CM | POA: Diagnosis not present

## 2016-11-19 DIAGNOSIS — I10 Essential (primary) hypertension: Secondary | ICD-10-CM

## 2016-11-19 LAB — COMPREHENSIVE METABOLIC PANEL
ALT: 26 U/L (ref 0–53)
AST: 15 U/L (ref 0–37)
Albumin: 4.3 g/dL (ref 3.5–5.2)
Alkaline Phosphatase: 73 U/L (ref 39–117)
BUN: 14 mg/dL (ref 6–23)
CO2: 30 mEq/L (ref 19–32)
Calcium: 9.6 mg/dL (ref 8.4–10.5)
Chloride: 102 mEq/L (ref 96–112)
Creatinine, Ser: 0.93 mg/dL (ref 0.40–1.50)
GFR: 94.26 mL/min (ref >60.00)
Glucose, Bld: 137 mg/dL — ABNORMAL HIGH (ref 70–99)
Potassium: 5 mEq/L (ref 3.5–5.1)
Sodium: 140 mEq/L (ref 135–145)
Total Bilirubin: 0.6 mg/dL (ref 0.2–1.2)
Total Protein: 6.8 g/dL (ref 6.0–8.3)

## 2016-11-19 LAB — MICROALBUMIN / CREATININE URINE RATIO
Creatinine,U: 115.2 mg/dL
Microalb Creat Ratio: 1.4 mg/g (ref 0.0–30.0)
Microalb, Ur: 1.6 mg/dL (ref 0.0–1.9)

## 2016-11-19 LAB — CBC WITH DIFFERENTIAL/PLATELET
Basophils Absolute: 0.1 10*3/uL (ref 0.0–0.1)
Basophils Relative: 0.9 % (ref 0.0–3.0)
Eosinophils Absolute: 0.2 10*3/uL (ref 0.0–0.7)
Eosinophils Relative: 2 % (ref 0.0–5.0)
HCT: 51 % (ref 39.0–52.0)
Hemoglobin: 17.2 g/dL — ABNORMAL HIGH (ref 13.0–17.0)
Lymphocytes Relative: 25.3 % (ref 12.0–46.0)
Lymphs Abs: 2.3 10*3/uL (ref 0.7–4.0)
MCHC: 33.7 g/dL (ref 30.0–36.0)
MCV: 85.2 fl (ref 78.0–100.0)
Monocytes Absolute: 0.7 10*3/uL (ref 0.1–1.0)
Monocytes Relative: 7.8 % (ref 3.0–12.0)
Neutro Abs: 5.8 10*3/uL (ref 1.4–7.7)
Neutrophils Relative %: 64 % (ref 43.0–77.0)
Platelets: 260 10*3/uL (ref 150.0–400.0)
RBC: 5.99 Mil/uL — ABNORMAL HIGH (ref 4.22–5.81)
RDW: 13.6 % (ref 11.5–15.5)
WBC: 9 10*3/uL (ref 4.0–10.5)

## 2016-11-19 LAB — LIPID PANEL
Cholesterol: 208 mg/dL — ABNORMAL HIGH (ref 0–200)
HDL: 34.4 mg/dL — ABNORMAL LOW (ref >39.00)
NonHDL: 174.09
Total CHOL/HDL Ratio: 6
Triglycerides: 247 mg/dL — ABNORMAL HIGH (ref 0.0–149.0)
VLDL: 49.4 mg/dL — ABNORMAL HIGH (ref 0.0–40.0)

## 2016-11-19 LAB — LDL CHOLESTEROL, DIRECT: Direct LDL: 128 mg/dL

## 2016-11-19 LAB — HEMOGLOBIN A1C: Hgb A1c MFr Bld: 7.9 % — ABNORMAL HIGH (ref 4.6–6.5)

## 2016-11-19 NOTE — Progress Notes (Signed)
Subjective:    Patient ID: Derrick West, male    DOB: 12-31-1975, 41 y.o.   MRN: [id number]  HPI  Derrick West is a 41 year old male who presents for follow up of his chronic conditions. He reports checking his BP at home where he noticed elevated readings and wanted to follow up for evaluation. He is wanting to obtain his lab work today for his physical with Dr. Burnice Logan; lab orders have been placed previously by this provider.  He is due for lab work including A1C. He reports weight gain and states that he is taking care of his wife who is now on dialysis and he has not monitored his intake as he should.  Wt Readings from Last 3 Encounters:  11/19/16 243 lb (110.2 kg)  12/27/15 243 lb (110.2 kg)  03/15/15 190 lb 8 oz (86.4 kg)    Elevated BP readings at home were noted. He reports that systolic average is 0000000 and diastolic averages noted in the 80s and 90s.  He has never been on BP medication in the past.  He reports monitoring salt as his wife has CKD.  He reports that he has started walking approximately 3 miles 2 to 4 times/week without cardiopulmonary symptoms.      Diabetes Type 2  Pt is currently maintained on the following medications for diabetes:  Metformin, Invokana, and Trulicity.  He monitors his blood sugar at home and he notes average in the 120s. He is due for an A1C at this time.  Lab Results  Component Value Date   HGBA1C 6.0 07/27/2014   HGBA1C 8.6 (H) 10/10/2012   HGBA1C 8.1 (H) 11/16/2010    Lab Results  Component Value Date   CREATININE 0.92 04/08/2013    Last diabetic eye exam was  Lab Results  Component Value Date   HMDIABEYEEXA normal 09/21/2010   Denies polyuria/polydipsia. Denies hypoglycemia Home glucose readings range 120s to highest in the 150s. Average blood sugar is noted in the 120s. Vision: He goes one time/year and he is due at this time.   Review of Systems  Constitutional: Negative for chills, fatigue and fever.  Eyes:  Negative for visual disturbance.  Respiratory: Negative for cough, shortness of breath and wheezing.   Cardiovascular: Negative for chest pain, palpitations and leg swelling.  Gastrointestinal: Negative for abdominal pain, constipation, diarrhea, nausea and vomiting.  Endocrine: Negative for polydipsia, polyphagia and polyuria.  Genitourinary: Negative for dysuria, frequency and urgency.  Musculoskeletal: Negative for myalgias.  Skin: Negative for rash.  Neurological: Negative for dizziness, light-headedness and headaches.  Psychiatric/Behavioral:       Denies depressed or anxious mood today; Increased stress reported taking care of wife with CKD on dialysis   Past Medical History:  Diagnosis Date  . DIABETES MELLITUS, TYPE II 12/24/2007  . GANGLION CYST, WRIST, LEFT 07/29/2007  . Headache(784.0)   . OBESITY 12/24/2007     Social History   Social History  . Marital status: Married    Spouse name: N/A  . Number of children: N/A  . Years of education: N/A   Occupational History  . Not on file.   Social History Main Topics  . Smoking status: Never Smoker  . Smokeless tobacco: Never Used  . Alcohol use Yes     Comment: occ  . Drug use: No  . Sexual activity: Not on file   Other Topics Concern  . Not on file   Social History Narrative  . No narrative  on file    Past Surgical History:  Procedure Laterality Date  . ANTERIOR CERVICAL DECOMP/DISCECTOMY FUSION N/A 04/15/2013   Procedure: ANTERIOR CERVICAL DECOMPRESSION/DISCECTOMY FUSION 1 LEVEL;  Surgeon: Winfield Cunas, MD;  Location: [facility];  Service: Neurosurgery;  Laterality: N/A;  Cervical Five-Six Anterior Cervical decompression with fusion plating and bonegraft  . LUMBAR LAMINECTOMY    . VSD REPAIR      No family history on file.  Allergies  Allergen Reactions  . Amoxicillin     As a child    Current Outpatient Prescriptions on File Prior to Visit  Medication Sig Dispense Refill  . INVOKANA 300 MG TABS  tablet TAKE 1 TABLET BY MOUTH DAILY BEFORE BREAKFAST. 30 tablet 1  . metFORMIN (GLUCOPHAGE) 1000 MG tablet Take 1 tablet (1,000 mg total) by mouth 2 (two) times daily with a meal. 180 tablet 3  . Multiple Vitamins-Minerals (CENTRUM ADULTS PO) Take 1 tablet by mouth daily.    Glory Rosebush VERIO test strip USE TO TEST BLOOD SUGAR ONCE DAILY 100 each 4  . TRULICITY [id number] 0000000 SOPN INJECT 0.75 MG INTO THE SKIN ONCE A WEEK. 2 pen 5   No current facility-administered medications on file prior to visit.     BP 118/84 (BP Location: Left Arm, Patient Position: Sitting, Cuff Size: Normal)   Pulse 92   Temp 98 F (36.7 C) (Oral)   Wt 243 lb (110.2 kg)   SpO2 96%   BMI 36.95 kg/m       Objective:   Physical Exam  Constitutional: He is oriented to person, place, and time. He appears well-developed and well-nourished.  Obese  HENT:  Right Ear: Tympanic membrane normal.  Left Ear: Tympanic membrane normal.  Nose: No rhinorrhea. Right sinus exhibits no maxillary sinus tenderness and no frontal sinus tenderness. Left sinus exhibits no maxillary sinus tenderness and no frontal sinus tenderness.  Mouth/Throat: Oropharynx is clear and moist.  Eyes: Pupils are equal, round, and reactive to light. No scleral icterus.  Neck: Neck supple.  Cardiovascular: Normal rate, regular rhythm and intact distal pulses.   Pulmonary/Chest: Effort normal and breath sounds normal. He has no wheezes. He has no rales.  Abdominal: Soft. Bowel sounds are normal. He exhibits no distension. There is no tenderness. There is no rebound.  Musculoskeletal: He exhibits no edema.  Lymphadenopathy:    He has no cervical adenopathy.  Neurological: He is alert and oriented to person, place, and time.  Skin: Skin is warm and dry. No rash noted.  Psychiatric: He has a normal mood and affect. His behavior is normal. Judgment and thought content normal.       Assessment & Plan:  1. Diabetes mellitus without complication  ([place]) Stable per patient report of home CBG readings; he will obtain lab work today; no symptoms of hypo/hypoglycemia; no reported adverse effects noted. Advised low carb diet and encouraged weight loss and exercise.  2. Elevated blood pressure reading with diagnosis of hypertension Retake of BP 118/84; Reviewed monitoring of BP parameters and encouraged checking BP daily for 2 weeks and follow up for readings >140/90. Lab work ordered by PCP will be obtained today.     Follow up with PCP for physical as recommended in the next 2 months and obtain lab work today.  Delano Metz, FNP-C

## 2016-11-19 NOTE — Patient Instructions (Addendum)
We have ordered labs or studies at this visit. It can take up to 1-2 weeks for results and processing. IF results require follow up or explanation, we will call you with instructions. Clinically stable results will be released to your [facility]. If you have not heard from [place] or cannot find your results in [facility] in 2 weeks please contact our office at [phone].  If you are not yet signed up for [facility], please consider signing up   Minimal Blood Pressure Goal= AVERAGE < 140/90; Ideal is an AVERAGE < 135/85. This AVERAGE should be calculated from @ least 5-7 BP readings taken @ different times of day on different days of week. You should not respond to isolated BP readings , but rather the AVERAGE for that week .Please bring your blood pressure cuff to office visits to verify that it is reliable.It can also be checked against the blood pressure device at the pharmacy. Finger or wrist cuffs are not dependable; an arm cuff is. + Please schedule a physical with Dr. Burnice Logan at your convenience; preferably in the next 2 months.  Please focus on low carbohydate, low salt diet. Keep up the exercise which is good for your diabetes and also stress.  DASH Eating Plan DASH stands for "Dietary Approaches to Stop Hypertension." The DASH eating plan is a healthy eating plan that has been shown to reduce high blood pressure (hypertension). Additional health benefits may include reducing the risk of type 2 diabetes mellitus, heart disease, and stroke. The DASH eating plan may also help with weight loss. What do I need to know about the DASH eating plan? For the DASH eating plan, you will follow these general guidelines:  Choose foods with less than 150 milligrams of sodium per serving (as listed on the food label).  Use salt-free seasonings or herbs instead of table salt or sea salt.  Check with your health care provider or pharmacist before using salt substitutes.  Eat lower-sodium products. These are  often labeled as "low-sodium" or "no salt added."  Eat fresh foods. Avoid eating a lot of canned foods.  Eat more vegetables, fruits, and low-fat dairy products.  Choose whole grains. Look for the word "whole" as the first word in the ingredient list.  Choose fish and skinless chicken or [place] more often than red meat. Limit fish, poultry, and meat to 6 oz (170 g) each day.  Limit sweets, desserts, sugars, and sugary drinks.  Choose heart-healthy fats.  Eat more home-cooked food and less restaurant, buffet, and fast food.  Limit fried foods.  Do not fry foods. Cook foods using methods such as baking, boiling, grilling, and broiling instead.  When eating at a restaurant, ask that your food be prepared with less salt, or no salt if possible. What foods can I eat? Seek help from a dietitian for individual calorie needs. Grains  Whole grain or whole wheat bread. Brown rice. Whole grain or whole wheat pasta. Quinoa, bulgur, and whole grain cereals. Low-sodium cereals. Corn or whole wheat flour tortillas. Whole grain cornbread. Whole grain crackers. Low-sodium crackers. Vegetables  Fresh or frozen vegetables (raw, steamed, roasted, or grilled). Low-sodium or reduced-sodium tomato and vegetable juices. Low-sodium or reduced-sodium tomato sauce and paste. Low-sodium or reduced-sodium canned vegetables. Fruits  All fresh, canned (in natural juice), or frozen fruits. Meat and Other Protein Products  Ground beef (85% or leaner), grass-fed beef, or beef trimmed of fat. Skinless chicken or [place]. Ground chicken or [place]. Pork trimmed of fat. All fish  and seafood. Eggs. Dried beans, peas, or lentils. Unsalted nuts and seeds. Unsalted canned beans. Dairy  Low-fat dairy products, such as skim or 1% milk, 2% or reduced-fat cheeses, low-fat ricotta or cottage cheese, or plain low-fat yogurt. Low-sodium or reduced-sodium cheeses. Fats and Oils  Tub margarines without trans fats. Light or  reduced-fat mayonnaise and salad dressings (reduced sodium). Avocado. Safflower, olive, or canola oils. Natural peanut or almond butter. Other  Unsalted popcorn and pretzels. The items listed above may not be a complete list of recommended foods or beverages. Contact your dietitian for more options.  What foods are not recommended? Grains  White bread. White pasta. White rice. Refined cornbread. Bagels and croissants. Crackers that contain trans fat. Vegetables  Creamed or fried vegetables. Vegetables in a cheese sauce. Regular canned vegetables. Regular canned tomato sauce and paste. Regular tomato and vegetable juices. Fruits  Canned fruit in light or heavy syrup. Fruit juice. Meat and Other Protein Products  Fatty cuts of meat. Ribs, chicken wings, bacon, sausage, bologna, salami, chitterlings, fatback, hot dogs, bratwurst, and packaged luncheon meats. Salted nuts and seeds. Canned beans with salt. Dairy  Whole or 2% milk, cream, half-and-half, and cream cheese. Whole-fat or sweetened yogurt. Full-fat cheeses or blue cheese. Nondairy creamers and whipped toppings. Processed cheese, cheese spreads, or cheese curds. Condiments  Onion and garlic salt, seasoned salt, table salt, and sea salt. Canned and packaged gravies. Worcestershire sauce. Tartar sauce. Barbecue sauce. Teriyaki sauce. Soy sauce, including reduced sodium. Steak sauce. Fish sauce. Oyster sauce. Cocktail sauce. Horseradish. Ketchup and mustard. Meat flavorings and tenderizers. Bouillon cubes. Hot sauce. Tabasco sauce. Marinades. Taco seasonings. Relishes. Fats and Oils  Butter, stick margarine, lard, shortening, ghee, and bacon fat. Coconut, palm kernel, or palm oils. Regular salad dressings. Other  Pickles and olives. Salted popcorn and pretzels. The items listed above may not be a complete list of foods and beverages to avoid. Contact your dietitian for more information.  Where can I find more information? National Heart,  Lung, and Blood Institute: travelstabloid.com This information is not intended to replace advice given to you by your health care provider. Make sure you discuss any questions you have with your health care provider. Document Released: 08/30/2011 Document Revised: 02/16/2016 Document Reviewed: 07/15/2013 Elsevier Interactive Patient Education  2017 [employer].

## 2016-11-19 NOTE — Progress Notes (Signed)
Pre visit review using our clinic review tool, if applicable. No additional management support is needed unless otherwise documented below in the visit note. 

## 2016-11-27 ENCOUNTER — Encounter: Payer: Self-pay | Admitting: Internal Medicine

## 2016-11-27 ENCOUNTER — Ambulatory Visit (INDEPENDENT_AMBULATORY_CARE_PROVIDER_SITE_OTHER): Payer: 59 | Admitting: Internal Medicine

## 2016-11-27 ENCOUNTER — Other Ambulatory Visit: Payer: Self-pay | Admitting: Internal Medicine

## 2016-11-27 VITALS — BP 128/80 | HR 97 | Temp 98.4°F | Ht 68.0 in | Wt 245.4 lb

## 2016-11-27 DIAGNOSIS — E785 Hyperlipidemia, unspecified: Secondary | ICD-10-CM

## 2016-11-27 DIAGNOSIS — E119 Type 2 diabetes mellitus without complications: Secondary | ICD-10-CM | POA: Diagnosis not present

## 2016-11-27 MED ORDER — DULAGLUTIDE 0.75 MG/0.5ML ~~LOC~~ SOAJ: 1.5000 mg | SUBCUTANEOUS | 5 refills | Status: DC

## 2016-11-27 MED ORDER — ATORVASTATIN CALCIUM 10 MG PO TABS: 10.0000 mg | ORAL_TABLET | Freq: Every day | ORAL | 3 refills | Status: DC

## 2016-11-27 MED ORDER — DULAGLUTIDE 1.5 MG/0.5ML ~~LOC~~ SOAJ: 1.5000 mg | SUBCUTANEOUS | 6 refills | Status: DC

## 2016-11-27 NOTE — Progress Notes (Signed)
Subjective:    Patient ID: Derrick West, male    DOB: 05-02-1976, 41 y.o.   MRN: [id number]  HPI  41 year old patient who has a history of diabetes. He was seen recently for follow-up and noted have poorly controlled diabetes He has been under considerable stress due to the poor health of his wife, who now is on chronic dialysis due to diabetic chronic kidney disease Medical regimen includes a submaximal dose of trulicity  He admits to dietary noncompliance.  Lab Results  Component Value Date   HGBA1C 7.9 (H) 11/19/2016     Past Medical History:  Diagnosis Date  . DIABETES MELLITUS, TYPE II 12/24/2007  . GANGLION CYST, WRIST, LEFT 07/29/2007  . Headache(784.0)   . OBESITY 12/24/2007     Social History   Social History  . Marital status: Married    Spouse name: N/A  . Number of children: N/A  . Years of education: N/A   Occupational History  . Not on file.   Social History Main Topics  . Smoking status: Never Smoker  . Smokeless tobacco: Never Used  . Alcohol use Yes     Comment: occ  . Drug use: No  . Sexual activity: Not on file   Other Topics Concern  . Not on file   Social History Narrative  . No narrative on file    Past Surgical History:  Procedure Laterality Date  . ANTERIOR CERVICAL DECOMP/DISCECTOMY FUSION N/A 04/15/2013   Procedure: ANTERIOR CERVICAL DECOMPRESSION/DISCECTOMY FUSION 1 LEVEL;  Surgeon: Winfield Cunas, MD;  Location: [place] [facility];  Service: Neurosurgery;  Laterality: N/A;  Cervical Five-Six Anterior Cervical decompression with fusion plating and bonegraft  . LUMBAR LAMINECTOMY    . VSD REPAIR      No family history on file.  Allergies  Allergen Reactions  . Amoxicillin     As a child    Current Outpatient Prescriptions on File Prior to Visit  Medication Sig Dispense Refill  . INVOKANA 300 MG TABS tablet TAKE 1 TABLET BY MOUTH DAILY BEFORE BREAKFAST. 30 tablet 1  . metFORMIN (GLUCOPHAGE) 1000 MG tablet Take 1 tablet (1,000 mg  total) by mouth 2 (two) times daily with a meal. 180 tablet 3  . Multiple Vitamins-Minerals (CENTRUM ADULTS PO) Take 1 tablet by mouth daily.    Glory Rosebush VERIO test strip USE TO TEST BLOOD SUGAR ONCE DAILY 100 each 4   No current facility-administered medications on file prior to visit.     BP 128/80 (BP Location: Left Arm, Patient Position: Sitting, Cuff Size: Normal)   Pulse 97   Temp 98.4 F (36.9 C) (Oral)   Ht 5\' 8"  (1.727 m)   Wt 245 lb 6.4 oz (111.3 kg)   SpO2 98%   BMI 37.31 kg/m     Review of Systems  Constitutional: Negative for appetite change, chills, fatigue and fever.  HENT: Negative for congestion, dental problem, ear pain, hearing loss, sore throat, tinnitus, trouble swallowing and voice change.   Eyes: Negative for pain, discharge and visual disturbance.  Respiratory: Negative for cough, chest tightness, wheezing and stridor.   Cardiovascular: Negative for chest pain, palpitations and leg swelling.  Gastrointestinal: Negative for abdominal distention, abdominal pain, blood in stool, constipation, diarrhea, nausea and vomiting.  Genitourinary: Negative for difficulty urinating, discharge, flank pain, genital sores, hematuria and urgency.  Musculoskeletal: Negative for arthralgias, back pain, gait problem, joint swelling, myalgias and neck stiffness.  Skin: Negative for rash.  Neurological: Negative for  dizziness, syncope, speech difficulty, weakness, numbness and headaches.  Hematological: Negative for adenopathy. Does not bruise/bleed easily.  Psychiatric/Behavioral: Negative for behavioral problems and dysphoric mood. The patient is not nervous/anxious.        Objective:   Physical Exam  Constitutional: He is oriented to person, place, and time. He appears well-developed.  HENT:  Head: Normocephalic.  Right Ear: External ear normal.  Left Ear: External ear normal.  Eyes: Conjunctivae and EOM are normal.  No obvious diabetic retinopathy  Neck: Normal  range of motion.  Cardiovascular: Normal rate and normal heart sounds.   Pulmonary/Chest: Breath sounds normal.  Abdominal: Bowel sounds are normal.  Musculoskeletal: Normal range of motion. He exhibits no edema or tenderness.  Neurological: He is alert and oriented to person, place, and time.  Psychiatric: He has a normal mood and affect. His behavior is normal.          Assessment & Plan:   Diabetes mellitus.  Suboptimal control.  Will increase Trulicity  To 1.5 milligrams weekly Continue full dose of Invokana and metformin. Lifestyle issues addressed Weight loss encouraged Follow-up 3 months    Annual eye examination encouraged  Nyoka Cowden

## 2016-11-27 NOTE — Progress Notes (Signed)
Pre visit review using our clinic review tool, if applicable. No additional management support is needed unless otherwise documented below in the visit note. 

## 2016-11-27 NOTE — Patient Instructions (Addendum)
Limit your sodium (Salt) intake   Please check your hemoglobin A1c every 3 months    It is important that you exercise regularly, at least 20 minutes 3 to 4 times per week.  If you develop chest pain or shortness of breath seek  medical attention.  You need to lose weight.  Consider a lower calorie diet and regular exercise.  Please see your eye doctor yearly to check for diabetic eye damage   

## 2016-12-05 ENCOUNTER — Other Ambulatory Visit: Payer: Self-pay | Admitting: Internal Medicine

## 2016-12-05 MED ORDER — DULAGLUTIDE 1.5 MG/0.5ML ~~LOC~~ SOAJ: 1.5000 mg | SUBCUTANEOUS | 6 refills | Status: DC

## 2016-12-22 ENCOUNTER — Other Ambulatory Visit: Payer: Self-pay | Admitting: Internal Medicine

## 2017-01-06 ENCOUNTER — Other Ambulatory Visit: Payer: Self-pay | Admitting: Internal Medicine

## 2017-02-27 ENCOUNTER — Ambulatory Visit: Payer: Self-pay | Admitting: Internal Medicine

## 2017-02-27 DIAGNOSIS — Z0289 Encounter for other administrative examinations: Secondary | ICD-10-CM

## 2017-04-05 ENCOUNTER — Other Ambulatory Visit: Payer: Self-pay | Admitting: Internal Medicine

## 2017-06-13 ENCOUNTER — Encounter: Payer: Self-pay | Admitting: Internal Medicine

## 2017-08-31 ENCOUNTER — Other Ambulatory Visit: Payer: Self-pay | Admitting: Internal Medicine

## 2017-09-05 ENCOUNTER — Emergency Department (HOSPITAL_COMMUNITY): Payer: 59

## 2017-09-05 ENCOUNTER — Observation Stay (HOSPITAL_COMMUNITY)
Admission: EM | Admit: 2017-09-05 | Discharge: 2017-09-06 | Disposition: A | Discharge status: Home / Self Care | Payer: 59 | Attending: Surgery | Admitting: Surgery

## 2017-09-05 ENCOUNTER — Encounter (HOSPITAL_COMMUNITY)
Admission: EM | Disposition: A | Discharge status: Home / Self Care | Payer: Self-pay | Source: Home / Self Care | Attending: Emergency Medicine

## 2017-09-05 ENCOUNTER — Observation Stay (HOSPITAL_COMMUNITY): Payer: 59 | Admitting: Anesthesiology

## 2017-09-05 ENCOUNTER — Encounter (HOSPITAL_COMMUNITY): Payer: Self-pay | Admitting: Emergency Medicine

## 2017-09-05 ENCOUNTER — Other Ambulatory Visit: Payer: Self-pay

## 2017-09-05 DIAGNOSIS — E119 Type 2 diabetes mellitus without complications: Secondary | ICD-10-CM | POA: Diagnosis not present

## 2017-09-05 DIAGNOSIS — Z79899 Other long term (current) drug therapy: Secondary | ICD-10-CM | POA: Diagnosis not present

## 2017-09-05 DIAGNOSIS — F4323 Adjustment disorder with mixed anxiety and depressed mood: Secondary | ICD-10-CM | POA: Diagnosis not present

## 2017-09-05 DIAGNOSIS — Z7984 Long term (current) use of oral hypoglycemic drugs: Secondary | ICD-10-CM | POA: Insufficient documentation

## 2017-09-05 DIAGNOSIS — K8020 Calculus of gallbladder without cholecystitis without obstruction: Secondary | ICD-10-CM | POA: Diagnosis not present

## 2017-09-05 DIAGNOSIS — K819 Cholecystitis, unspecified: Secondary | ICD-10-CM | POA: Diagnosis not present

## 2017-09-05 DIAGNOSIS — Z6837 Body mass index (BMI) 37.0-37.9, adult: Secondary | ICD-10-CM | POA: Insufficient documentation

## 2017-09-05 DIAGNOSIS — M542 Cervicalgia: Secondary | ICD-10-CM | POA: Diagnosis not present

## 2017-09-05 DIAGNOSIS — R1013 Epigastric pain: Secondary | ICD-10-CM | POA: Diagnosis present

## 2017-09-05 DIAGNOSIS — K8010 Calculus of gallbladder with chronic cholecystitis without obstruction: Secondary | ICD-10-CM | POA: Diagnosis not present

## 2017-09-05 DIAGNOSIS — R1111 Vomiting without nausea: Secondary | ICD-10-CM | POA: Diagnosis not present

## 2017-09-05 DIAGNOSIS — Z880 Allergy status to penicillin: Secondary | ICD-10-CM | POA: Diagnosis not present

## 2017-09-05 DIAGNOSIS — K8000 Calculus of gallbladder with acute cholecystitis without obstruction: Secondary | ICD-10-CM | POA: Diagnosis present

## 2017-09-05 DIAGNOSIS — R109 Unspecified abdominal pain: Secondary | ICD-10-CM | POA: Diagnosis not present

## 2017-09-05 DIAGNOSIS — M67432 Ganglion, left wrist: Secondary | ICD-10-CM | POA: Diagnosis not present

## 2017-09-05 DIAGNOSIS — K810 Acute cholecystitis: Secondary | ICD-10-CM | POA: Diagnosis not present

## 2017-09-05 HISTORY — PX: CHOLECYSTECTOMY: SHX55

## 2017-09-05 LAB — COMPREHENSIVE METABOLIC PANEL
ALT: 28 U/L (ref 17–63)
AST: 17 U/L (ref 15–41)
Albumin: 3.8 g/dL (ref 3.5–5.0)
Alkaline Phosphatase: 83 U/L (ref 38–126)
Anion gap: 8 (ref 5–15)
BUN: 14 mg/dL (ref 6–20)
CO2: 24 mmol/L (ref 22–32)
Calcium: 9 mg/dL (ref 8.9–10.3)
Chloride: 105 mmol/L (ref 101–111)
Creatinine, Ser: 0.93 mg/dL (ref 0.61–1.24)
GFR calc Af Amer: >60 mL/min (ref >60)
GFR calc non Af Amer: >60 mL/min (ref >60)
Glucose, Bld: 245 mg/dL — ABNORMAL HIGH (ref 65–99)
Potassium: 3.8 mmol/L (ref 3.5–5.1)
Sodium: 137 mmol/L (ref 135–145)
Total Bilirubin: 0.9 mg/dL (ref 0.3–1.2)
Total Protein: 6.6 g/dL (ref 6.5–8.1)

## 2017-09-05 LAB — CBC
HCT: 48 % (ref 39.0–52.0)
HCT: 46.2 % (ref 39.0–52.0)
Hemoglobin: 16.9 g/dL (ref 13.0–17.0)
Hemoglobin: 16.1 g/dL (ref 13.0–17.0)
MCH: 29.2 pg (ref 26.0–34.0)
MCH: 29.1 pg (ref 26.0–34.0)
MCHC: 35.2 g/dL (ref 30.0–36.0)
MCHC: 34.8 g/dL (ref 30.0–36.0)
MCV: 82.9 fL (ref 78.0–100.0)
MCV: 83.5 fL (ref 78.0–100.0)
Platelets: 230 10*3/uL (ref 150–400)
Platelets: 223 10*3/uL (ref 150–400)
RBC: 5.79 MIL/uL (ref 4.22–5.81)
RBC: 5.53 MIL/uL (ref 4.22–5.81)
RDW: 12.7 % (ref 11.5–15.5)
RDW: 12.8 % (ref 11.5–15.5)
WBC: 8.4 10*3/uL (ref 4.0–10.5)
WBC: 10.9 10*3/uL — ABNORMAL HIGH (ref 4.0–10.5)

## 2017-09-05 LAB — URINALYSIS, ROUTINE W REFLEX MICROSCOPIC
Bacteria, UA: NONE SEEN
Bilirubin Urine: NEGATIVE
Glucose, UA: >=500 mg/dL — AB
Hgb urine dipstick: NEGATIVE
Ketones, ur: NEGATIVE mg/dL
Leukocytes, UA: NEGATIVE
Nitrite: NEGATIVE
Protein, ur: NEGATIVE mg/dL
Specific Gravity, Urine: 1.031 — ABNORMAL HIGH (ref 1.005–1.030)
pH: 5 (ref 5.0–8.0)

## 2017-09-05 LAB — GLUCOSE, CAPILLARY
Glucose-Capillary: 148 mg/dL — ABNORMAL HIGH (ref 65–99)
Glucose-Capillary: 173 mg/dL — ABNORMAL HIGH (ref 65–99)
Glucose-Capillary: 312 mg/dL — ABNORMAL HIGH (ref 65–99)

## 2017-09-05 LAB — LIPASE, BLOOD: Lipase: 39 U/L (ref 11–51)

## 2017-09-05 LAB — CREATININE, SERUM
Creatinine, Ser: 0.78 mg/dL (ref 0.61–1.24)
GFR calc Af Amer: >60 mL/min (ref >60)
GFR calc non Af Amer: >60 mL/min (ref >60)

## 2017-09-05 LAB — CBG MONITORING, ED: Glucose-Capillary: 146 mg/dL — ABNORMAL HIGH (ref 65–99)

## 2017-09-05 SURGERY — LAPAROSCOPIC CHOLECYSTECTOMY WITH INTRAOPERATIVE CHOLANGIOGRAM
Anesthesia: General | Site: Abdomen

## 2017-09-05 MED ORDER — HYDROMORPHONE HCL 1 MG/ML IJ SOLN
1.0000 mg | Freq: Once | INTRAMUSCULAR | Status: AC
  Administered 2017-09-05: 1 mg via INTRAVENOUS
  Filled 2017-09-05: qty 1

## 2017-09-05 MED ORDER — INSULIN ASPART 100 UNIT/ML ~~LOC~~ SOLN
0.0000 [IU] | Freq: Every day | SUBCUTANEOUS | Status: DC
  Administered 2017-09-05: 4 [IU] via SUBCUTANEOUS

## 2017-09-05 MED ORDER — ACETAMINOPHEN 650 MG RE SUPP: 650.0000 mg | Freq: Four times a day (QID) | RECTAL | Status: DC | PRN

## 2017-09-05 MED ORDER — MIDAZOLAM HCL 2 MG/2ML IJ SOLN
INTRAMUSCULAR | Status: AC
  Filled 2017-09-05: qty 2

## 2017-09-05 MED ORDER — ENOXAPARIN SODIUM 40 MG/0.4ML ~~LOC~~ SOLN: 40.0000 mg | SUBCUTANEOUS | Status: DC

## 2017-09-05 MED ORDER — 0.9 % SODIUM CHLORIDE (POUR BTL) OPTIME
TOPICAL | Status: DC | PRN
  Administered 2017-09-05: 1000 mL

## 2017-09-05 MED ORDER — HYDROMORPHONE HCL 1 MG/ML IJ SOLN: 1.0000 mg | INTRAMUSCULAR | Status: DC | PRN

## 2017-09-05 MED ORDER — OXYCODONE HCL 5 MG PO TABS
5.0000 mg | ORAL_TABLET | ORAL | Status: DC | PRN
  Administered 2017-09-05: 10 mg via ORAL

## 2017-09-05 MED ORDER — PROPOFOL 10 MG/ML IV BOLUS
INTRAVENOUS | Status: AC
  Filled 2017-09-05: qty 20

## 2017-09-05 MED ORDER — FENTANYL CITRATE (PF) 250 MCG/5ML IJ SOLN
INTRAMUSCULAR | Status: AC
  Filled 2017-09-05: qty 5

## 2017-09-05 MED ORDER — ONDANSETRON HCL 4 MG/2ML IJ SOLN: 4.0000 mg | Freq: Four times a day (QID) | INTRAMUSCULAR | Status: DC | PRN

## 2017-09-05 MED ORDER — HYDRALAZINE HCL 20 MG/ML IJ SOLN: 10.0000 mg | INTRAMUSCULAR | Status: DC | PRN

## 2017-09-05 MED ORDER — FENTANYL CITRATE (PF) 100 MCG/2ML IJ SOLN
INTRAMUSCULAR | Status: DC | PRN
  Administered 2017-09-05: 100 ug via INTRAVENOUS
  Administered 2017-09-05: 50 ug via INTRAVENOUS
  Administered 2017-09-05: 150 ug via INTRAVENOUS

## 2017-09-05 MED ORDER — LIDOCAINE HCL (CARDIAC) 20 MG/ML IV SOLN
INTRAVENOUS | Status: DC | PRN
  Administered 2017-09-05: 60 mg via INTRAVENOUS

## 2017-09-05 MED ORDER — HYDROMORPHONE HCL 1 MG/ML IJ SOLN: 1.0000 mg | Freq: Once | INTRAMUSCULAR | Status: DC

## 2017-09-05 MED ORDER — ENOXAPARIN SODIUM 40 MG/0.4ML ~~LOC~~ SOLN
40.0000 mg | SUBCUTANEOUS | Status: DC
  Administered 2017-09-06: 40 mg via SUBCUTANEOUS
  Filled 2017-09-05: qty 0.4

## 2017-09-05 MED ORDER — DIPHENHYDRAMINE HCL 25 MG PO CAPS: 25.0000 mg | ORAL_CAPSULE | Freq: Four times a day (QID) | ORAL | Status: DC | PRN

## 2017-09-05 MED ORDER — SODIUM CHLORIDE 0.9 % IV SOLN
1000.0000 mL | INTRAVENOUS | Status: DC
  Administered 2017-09-05: 1000 mL via INTRAVENOUS

## 2017-09-05 MED ORDER — INSULIN ASPART 100 UNIT/ML ~~LOC~~ SOLN: 0.0000 [IU] | SUBCUTANEOUS | Status: DC

## 2017-09-05 MED ORDER — SODIUM CHLORIDE 0.9 % IV BOLUS (SEPSIS)
1000.0000 mL | Freq: Once | INTRAVENOUS | Status: AC
  Administered 2017-09-05: 1000 mL via INTRAVENOUS

## 2017-09-05 MED ORDER — ROCURONIUM BROMIDE 100 MG/10ML IV SOLN
INTRAVENOUS | Status: DC | PRN
  Administered 2017-09-05: 10 mg via INTRAVENOUS
  Administered 2017-09-05: 30 mg via INTRAVENOUS

## 2017-09-05 MED ORDER — LACTATED RINGERS IV SOLN
INTRAVENOUS | Status: DC
  Administered 2017-09-05 (×3): via INTRAVENOUS

## 2017-09-05 MED ORDER — BUPIVACAINE-EPINEPHRINE (PF) 0.25% -1:200000 IJ SOLN
INTRAMUSCULAR | Status: AC
  Filled 2017-09-05: qty 30

## 2017-09-05 MED ORDER — MORPHINE SULFATE (PF) 4 MG/ML IV SOLN: 1.0000 mg | INTRAVENOUS | Status: DC | PRN

## 2017-09-05 MED ORDER — MIDAZOLAM HCL 5 MG/5ML IJ SOLN
INTRAMUSCULAR | Status: DC | PRN
  Administered 2017-09-05: 2 mg via INTRAVENOUS

## 2017-09-05 MED ORDER — DEXAMETHASONE SODIUM PHOSPHATE 10 MG/ML IJ SOLN
INTRAMUSCULAR | Status: AC
  Filled 2017-09-05: qty 1

## 2017-09-05 MED ORDER — DIPHENHYDRAMINE HCL 50 MG/ML IJ SOLN: 25.0000 mg | Freq: Four times a day (QID) | INTRAMUSCULAR | Status: DC | PRN

## 2017-09-05 MED ORDER — HYDROMORPHONE HCL 1 MG/ML IJ SOLN: 0.2500 mg | INTRAMUSCULAR | Status: DC | PRN

## 2017-09-05 MED ORDER — ONDANSETRON HCL 4 MG/2ML IJ SOLN
4.0000 mg | Freq: Once | INTRAMUSCULAR | Status: AC
  Administered 2017-09-05: 4 mg via INTRAVENOUS
  Filled 2017-09-05: qty 2

## 2017-09-05 MED ORDER — ROCURONIUM BROMIDE 10 MG/ML (PF) SYRINGE
PREFILLED_SYRINGE | INTRAVENOUS | Status: AC
Start: 2017-09-05 — End: ?
  Filled 2017-09-05: qty 5

## 2017-09-05 MED ORDER — ONDANSETRON 4 MG PO TBDP: 4.0000 mg | ORAL_TABLET | Freq: Four times a day (QID) | ORAL | Status: DC | PRN

## 2017-09-05 MED ORDER — SODIUM CHLORIDE 0.9 % IR SOLN
Status: DC | PRN
  Administered 2017-09-05: 1000 mL

## 2017-09-05 MED ORDER — DEXAMETHASONE SODIUM PHOSPHATE 10 MG/ML IJ SOLN
INTRAMUSCULAR | Status: DC | PRN
  Administered 2017-09-05: 8 mg via INTRAVENOUS

## 2017-09-05 MED ORDER — BUPIVACAINE-EPINEPHRINE 0.25% -1:200000 IJ SOLN
INTRAMUSCULAR | Status: DC | PRN
  Administered 2017-09-05: 20 mL

## 2017-09-05 MED ORDER — PROPOFOL 10 MG/ML IV BOLUS
INTRAVENOUS | Status: DC | PRN
  Administered 2017-09-05: 150 mg via INTRAVENOUS

## 2017-09-05 MED ORDER — ONDANSETRON HCL 4 MG/2ML IJ SOLN
INTRAMUSCULAR | Status: DC | PRN
  Administered 2017-09-05: 4 mg via INTRAVENOUS

## 2017-09-05 MED ORDER — METHOCARBAMOL 500 MG PO TABS: 500.0000 mg | ORAL_TABLET | Freq: Three times a day (TID) | ORAL | Status: DC | PRN

## 2017-09-05 MED ORDER — POTASSIUM CHLORIDE IN NACL 20-0.9 MEQ/L-% IV SOLN
INTRAVENOUS | Status: DC
  Administered 2017-09-05 – 2017-09-06 (×3): via INTRAVENOUS
  Filled 2017-09-05 (×3): qty 1000

## 2017-09-05 MED ORDER — DEXTROSE 5 % IV SOLN
2.0000 g | Freq: Once | INTRAVENOUS | Status: AC
  Administered 2017-09-05: 2 g via INTRAVENOUS
  Filled 2017-09-05: qty 2

## 2017-09-05 MED ORDER — ONDANSETRON HCL 4 MG/2ML IJ SOLN
INTRAMUSCULAR | Status: AC
  Filled 2017-09-05: qty 2

## 2017-09-05 MED ORDER — INSULIN ASPART 100 UNIT/ML ~~LOC~~ SOLN
0.0000 [IU] | Freq: Three times a day (TID) | SUBCUTANEOUS | Status: DC
  Administered 2017-09-05: 4 [IU] via SUBCUTANEOUS
  Administered 2017-09-06: 3 [IU] via SUBCUTANEOUS

## 2017-09-05 MED ORDER — SIMETHICONE 80 MG PO CHEW: 40.0000 mg | CHEWABLE_TABLET | Freq: Four times a day (QID) | ORAL | Status: DC | PRN

## 2017-09-05 MED ORDER — OXYCODONE HCL 5 MG PO TABS
5.0000 mg | ORAL_TABLET | ORAL | Status: DC | PRN
  Administered 2017-09-05 – 2017-09-06 (×2): 10 mg via ORAL
  Administered 2017-09-06: 5 mg via ORAL
  Filled 2017-09-05 (×2): qty 2
  Filled 2017-09-05: qty 1

## 2017-09-05 MED ORDER — SUCCINYLCHOLINE CHLORIDE 20 MG/ML IJ SOLN
INTRAMUSCULAR | Status: DC | PRN
  Administered 2017-09-05: 100 mg via INTRAVENOUS

## 2017-09-05 MED ORDER — TRAMADOL HCL 50 MG PO TABS: 50.0000 mg | ORAL_TABLET | Freq: Four times a day (QID) | ORAL | Status: DC | PRN

## 2017-09-05 MED ORDER — SUGAMMADEX SODIUM 200 MG/2ML IV SOLN
INTRAVENOUS | Status: DC | PRN
  Administered 2017-09-05: 200 mg via INTRAVENOUS

## 2017-09-05 MED ORDER — ACETAMINOPHEN 325 MG PO TABS: 650.0000 mg | ORAL_TABLET | Freq: Four times a day (QID) | ORAL | Status: DC | PRN

## 2017-09-05 MED ORDER — SUGAMMADEX SODIUM 200 MG/2ML IV SOLN
INTRAVENOUS | Status: AC
  Filled 2017-09-05: qty 2

## 2017-09-05 MED ORDER — OXYCODONE HCL 5 MG PO TABS
ORAL_TABLET | ORAL | Status: AC
  Filled 2017-09-05: qty 2

## 2017-09-05 MED ORDER — HEMOSTATIC AGENTS (NO CHARGE) OPTIME
TOPICAL | Status: DC | PRN
  Administered 2017-09-05: 1 via TOPICAL

## 2017-09-05 MED ORDER — IOPAMIDOL (ISOVUE-300) INJECTION 61%
INTRAVENOUS | Status: AC
Start: 2017-09-05 — End: ?
  Filled 2017-09-05: qty 50

## 2017-09-05 MED ORDER — ACETAMINOPHEN 325 MG PO TABS
650.0000 mg | ORAL_TABLET | Freq: Four times a day (QID) | ORAL | Status: DC | PRN
  Administered 2017-09-06: 650 mg via ORAL
  Filled 2017-09-05: qty 2

## 2017-09-05 SURGICAL SUPPLY — 41 items
APPLIER CLIP 5 13 M/L LIGAMAX5 (MISCELLANEOUS) ×2
BLADE CLIPPER SURG (BLADE) IMPLANT
CANISTER SUCT 3000ML PPV (MISCELLANEOUS) ×2 IMPLANT
CHLORAPREP W/TINT 26ML (MISCELLANEOUS) ×2 IMPLANT
CLIP APPLIE 5 13 M/L LIGAMAX5 (MISCELLANEOUS) ×1 IMPLANT
COVER MAYO STAND STRL (DRAPES) IMPLANT
COVER SURGICAL LIGHT HANDLE (MISCELLANEOUS) ×2 IMPLANT
DERMABOND ADVANCED (GAUZE/BANDAGES/DRESSINGS) ×1
DERMABOND ADVANCED .7 DNX12 (GAUZE/BANDAGES/DRESSINGS) ×1 IMPLANT
DRAIN CHANNEL 19F RND (DRAIN) ×2 IMPLANT
DRAPE C-ARM 42X72 X-RAY (DRAPES) IMPLANT
DRSG TEGADERM 4X4.75 (GAUZE/BANDAGES/DRESSINGS) ×2 IMPLANT
ELECT REM PT RETURN 9FT ADLT (ELECTROSURGICAL) ×2
ELECTRODE REM PT RTRN 9FT ADLT (ELECTROSURGICAL) ×1 IMPLANT
EVACUATOR SILICONE 100CC (DRAIN) ×2 IMPLANT
GAUZE SPONGE 2X2 8PLY STRL LF (GAUZE/BANDAGES/DRESSINGS) ×1 IMPLANT
GLOVE SURG SIGNA 7.5 PF LTX (GLOVE) ×2 IMPLANT
GOWN STRL REUS W/ TWL LRG LVL3 (GOWN DISPOSABLE) ×2 IMPLANT
GOWN STRL REUS W/ TWL XL LVL3 (GOWN DISPOSABLE) ×1 IMPLANT
GOWN STRL REUS W/TWL LRG LVL3 (GOWN DISPOSABLE) ×2
GOWN STRL REUS W/TWL XL LVL3 (GOWN DISPOSABLE) ×1
HEMOSTAT SNOW SURGICEL 2X4 (HEMOSTASIS) ×2 IMPLANT
KIT BASIN OR (CUSTOM PROCEDURE TRAY) ×2 IMPLANT
KIT ROOM TURNOVER OR (KITS) ×2 IMPLANT
NS IRRIG 1000ML POUR BTL (IV SOLUTION) ×2 IMPLANT
PAD ARMBOARD 7.5X6 YLW CONV (MISCELLANEOUS) ×2 IMPLANT
POUCH SPECIMEN RETRIEVAL 10MM (ENDOMECHANICALS) ×2 IMPLANT
SCISSORS LAP 5X35 DISP (ENDOMECHANICALS) ×2 IMPLANT
SET CHOLANGIOGRAPH 5 50 .035 (SET/KITS/TRAYS/PACK) IMPLANT
SET IRRIG TUBING LAPAROSCOPIC (IRRIGATION / IRRIGATOR) ×2 IMPLANT
SLEEVE ENDOPATH XCEL 5M (ENDOMECHANICALS) ×4 IMPLANT
SPECIMEN JAR SMALL (MISCELLANEOUS) ×2 IMPLANT
SPONGE GAUZE 2X2 STER 10/PKG (GAUZE/BANDAGES/DRESSINGS) ×1
SUT ETHILON 2 0 FS 18 (SUTURE) ×2 IMPLANT
SUT MNCRL AB 4-0 PS2 18 (SUTURE) ×2 IMPLANT
TOWEL OR 17X24 6PK STRL BLUE (TOWEL DISPOSABLE) ×2 IMPLANT
TOWEL OR 17X26 10 PK STRL BLUE (TOWEL DISPOSABLE) ×2 IMPLANT
TRAY LAPAROSCOPIC MC (CUSTOM PROCEDURE TRAY) ×2 IMPLANT
TROCAR XCEL BLUNT TIP 100MML (ENDOMECHANICALS) ×2 IMPLANT
TROCAR XCEL NON-BLD 5MMX100MML (ENDOMECHANICALS) ×2 IMPLANT
TUBING INSUFFLATION (TUBING) ×2 IMPLANT

## 2017-09-05 NOTE — ED Provider Notes (Signed)
[place] EMERGENCY DEPARTMENT Provider Note   CSN: [id number] Arrival date & time: 09/05/17  5277     History   Chief Complaint Chief Complaint  Patient presents with  . Abdominal Pain  . Back Pain    HPI Derrick West is a 41 y.o. male.  HPI   Patient is a 41 year old male with history of diabetes mellitus type 2 (on metformin and Invokana) and no abdominal surgical history presenting for epigastric pain that migrated into the right flank over the past 24 hours.  Patient reports that he has been nauseous over the past day with the discomfort in his upper abdomen.  No emesis.  Subsequently, patient developed right sided, right flank below the rib space, and right lower quadrant pain.  Patient reports that it was initially more colicky in nature, however it is becoming more constant.  No diarrhea.  No fevers or chills.  No hematuria or dysuria.  There is no numbness, paresthesias, or weakness of the lower extremities.  Patient reports this episode is not consistent with his prior back pain. Patient has not had any p.o. intake since last night.  Patient reports that no position is comfortable and he has been lying flat most of the time for comfort.  Ibuprofen without relief.    Past Medical History:  Diagnosis Date  . DIABETES MELLITUS, TYPE II 12/24/2007  . GANGLION CYST, WRIST, LEFT 07/29/2007  . Headache(784.0)   . OBESITY 12/24/2007    Patient Active Problem List   Diagnosis Date Noted  . Adjustment disorder with mixed anxiety and depressed mood 12/23/2014  . Neck pain 11/24/2012  . Diabetes mellitus without complication ([place]) 82/42/3536  . OBESITY 12/24/2007  . GANGLION CYST, WRIST, LEFT 07/29/2007    Past Surgical History:  Procedure Laterality Date  . ANTERIOR CERVICAL DECOMP/DISCECTOMY FUSION N/A 04/15/2013   Procedure: ANTERIOR CERVICAL DECOMPRESSION/DISCECTOMY FUSION 1 LEVEL;  Surgeon: Winfield Cunas, MD;  Location: [place] [facility];  Service:  Neurosurgery;  Laterality: N/A;  Cervical Five-Six Anterior Cervical decompression with fusion plating and bonegraft  . LUMBAR LAMINECTOMY    . VSD REPAIR         Home Medications    Prior to Admission medications   Medication Sig Start Date End Date Taking? Authorizing Provider  INVOKANA 300 MG TABS tablet TAKE 1 TABLET BY MOUTH DAILY BEFORE BREAKFAST. 09/02/17  Yes Marletta Lor, MD  metFORMIN (GLUCOPHAGE) 1000 MG tablet TAKE 1 TABLET BY MOUTH 2 TIMES DAILY WITH A MEAL. 12/24/16  Yes Marletta Lor, MD  atorvastatin (LIPITOR) 10 MG tablet Take 1 tablet (10 mg total) by mouth daily. Patient not taking: Reported on 09/05/2017 11/27/16   Marletta Lor, MD  Dulaglutide (TRULICITY) 1.5 RW/4.3XV SOPN Inject 1.5 mg into the skin once a week. Patient not taking: Reported on 09/05/2017 12/05/16   Marletta Lor, MD  [facility] VERIO test strip USE TO TEST BLOOD SUGAR ONCE DAILY 09/28/16   Marletta Lor, MD    Family History No family history on file.  Social History Social History   Tobacco Use  . Smoking status: Never Smoker  . Smokeless tobacco: Never Used  Substance Use Topics  . Alcohol use: Yes    Comment: occ  . Drug use: Yes    Types: Marijuana     Allergies   Amoxicillin   Review of Systems Review of Systems  Constitutional: Negative for chills and fever.  HENT: Negative for congestion and rhinorrhea.  Respiratory: Negative for shortness of breath.   Cardiovascular: Negative for chest pain.  Gastrointestinal: Positive for abdominal pain and nausea. Negative for constipation, diarrhea and vomiting.  Genitourinary: Positive for flank pain. Negative for difficulty urinating, dysuria and hematuria.  Musculoskeletal: Negative for myalgias.  All other systems reviewed and are negative.    Physical Exam Updated Vital Signs BP (!) 150/88 (BP Location: Right Arm)   Pulse 89   Temp 97.8 F (36.6 C) (Oral)   Resp 16   SpO2 96%   Physical  Exam  Constitutional: He appears well-developed and well-nourished. No distress.  HENT:  Head: Normocephalic and atraumatic.  Mouth/Throat: Oropharynx is clear and moist.  Eyes: Conjunctivae and EOM are normal. Pupils are equal, round, and reactive to light.  Neck: Normal range of motion. Neck supple.  Cardiovascular: Normal rate, regular rhythm, S1 normal and S2 normal.  No murmur heard. Pulmonary/Chest: Effort normal and breath sounds normal. He has no wheezes. He has no rales.  Abdominal: Soft. He exhibits no distension. There is tenderness. There is no guarding.  No CVA tenderness.  Patient is pan tender across the abdomen but most acutely in the epigastrium and right lateral abdomen.  Negative Murphy sign.    Musculoskeletal: Normal range of motion. He exhibits no edema or deformity.  There is no tenderness to palpation of the right paraspinal musculature of the thoracic and lumbar spine.  Lymphadenopathy:    He has no cervical adenopathy.  Neurological: He is alert.  Cranial nerves grossly intact. Patient was extremities symmetrically and with good coordination.  Skin: Skin is warm and dry. No rash noted. No erythema.  Psychiatric: He has a normal mood and affect. His behavior is normal. Judgment and thought content normal.  Nursing note and vitals reviewed.    ED Treatments / Results  Labs (all labs ordered are listed, but only abnormal results are displayed) Labs Reviewed  COMPREHENSIVE METABOLIC PANEL - Abnormal; Notable for the following components:      Result Value   Glucose, Bld 245 (*)    All other components within normal limits  URINALYSIS, ROUTINE W REFLEX MICROSCOPIC - Abnormal; Notable for the following components:   Color, Urine STRAW (*)    Specific Gravity, Urine 1.031 (*)    Glucose, UA >=500 (*)    Squamous Epithelial / LPF 0-5 (*)    All other components within normal limits  LIPASE, BLOOD  CBC    EKG  EKG Interpretation None        Radiology Ct Renal Stone Study  Result Date: 09/05/2017 CLINICAL DATA:  41 year old male with right flank pain radiating to the right lower quadrant since noon yesterday. Difficulty urinating. Nausea without vomiting. EXAM: CT ABDOMEN AND PELVIS WITHOUT CONTRAST TECHNIQUE: Multidetector CT imaging of the abdomen and pelvis was performed following the standard protocol without IV contrast. COMPARISON:  Chest radiographs 11/23/2015. FINDINGS: Lower chest: Mild cardiomegaly and mediastinal lipomatosis. No pericardial effusion. Negative lung bases. No pleural effusion. Hepatobiliary: 14-15 mm gallstone is evident in the gallbladder on series 3, image 25. No pericholecystic inflammation. Negative noncontrast liver. Pancreas: Negative. Spleen: Negative; multiple punctate calcified granulomas. Adrenals/Urinary Tract: Normal adrenal glands. Negative noncontrast kidneys. No nephrolithiasis or perinephric stranding. No right hydronephrosis. On the left an extrarenal pelvis is suspected (normal variant). Both ureters are decompressed to the bladder and appear normal. The urinary bladder is mildly distended but otherwise unremarkable. Stomach/Bowel: Decompressed rectum. Redundant but otherwise negative sigmoid colon. Negative left colon and splenic flexure. Negative  transverse colon and hepatic flexure. Negative right colon. Normal appendix (series 3, image 66). Negative terminal ileum. No dilated small bowel. Decompressed stomach and duodenum. No abdominal free fluid or mesenteric stranding. Vascular/Lymphatic: Vascular patency is not evaluated in the absence of IV contrast. No lymphadenopathy. Reproductive: Negative. Other: No pelvic free fluid.  Mild pelvic lipomatosis. Musculoskeletal: Straightening of the spine and multilevel chronic disc and endplate degeneration. No acute osseous abnormality identified. IMPRESSION: 1. Cholelithiasis. No CT evidence of acute cholecystitis, but Right Upper Quadrant Ultrasound  would be more sensitive. 2. Otherwise negative noncontrast CT Abdomen and Pelvis. No urologic calculus or obstructive uropathy. Normal appendix. Electronically Signed   By: Genevie Ann M.D.   On: 09/05/2017 08:01   US Abdomen Limited Ruq  Result Date: 09/05/2017 CLINICAL DATA:  Epigastric pain EXAM: ULTRASOUND ABDOMEN LIMITED RIGHT UPPER QUADRANT COMPARISON:  Abdominal and pelvic CT scan of today's date FINDINGS: Gallbladder: The gallbladder is adequately distended. There is an echogenic mobile shadowing stone measuring 1.7 mm in diameter. There is borderline gallbladder wall thickening. There is a positive sonographic Murphy's sign and there may be a small amount of pericholecystic fluid. Common bile duct: Diameter: 3.3 mm Liver: The hepatic echotexture is increased. The surface contour is smooth. There is no discrete mass or ductal dilation. Portal vein is patent on color Doppler imaging with normal direction of blood flow towards the liver. IMPRESSION: Gallstones with sonographic evidence of acute cholecystitis. Increased hepatic echotexture most compatible with fatty infiltrative change. Electronically Signed   By: David  Martinique M.D.   On: 09/05/2017 10:24    Procedures Procedures (including critical care time)  Medications Ordered in ED Medications  sodium chloride 0.9 % bolus 1,000 mL (0 mLs Intravenous Stopped 09/05/17 0848)    Followed by  0.9 %  sodium chloride infusion (0 mLs Intravenous Stopped 09/05/17 1051)  cefTRIAXone (ROCEPHIN) 2 g in dextrose 5 % 50 mL IVPB (2 g Intravenous New Bag/Given 09/05/17 1059)  sodium chloride 0.9 % bolus 1,000 mL (0 mLs Intravenous Stopped 09/05/17 1051)  HYDROmorphone (DILAUDID) injection 1 mg (1 mg Intravenous Given 09/05/17 0700)  ondansetron (ZOFRAN) injection 4 mg (4 mg Intravenous Given 09/05/17 0700)  HYDROmorphone (DILAUDID) injection 1 mg (1 mg Intravenous Given 09/05/17 0818)  HYDROmorphone (DILAUDID) injection 1 mg (1 mg Intravenous Given  09/05/17 1055)     Initial Impression / Assessment and Plan / ED Course  I have reviewed the triage vital signs and the nursing notes.  Pertinent labs & imaging results that were available during my care of the patient were reviewed by me and considered in my medical decision making (see chart for details).      Final Clinical Impressions(s) / ED Diagnoses   Final diagnoses:  Epigastric pain  Cholecystitis   Patient is nontoxic-appearing but significantly uncomfortable due to pain.  Afebrile this time.  Dilaudid for pain management.  Fluid repletion with 2 L and maintenance fluids.   Laboratory studies significant for a glucose of 245.  No leukocytosis.  Lipase is normal.  CT scan demonstrates cholelithiasis but otherwise no other inflammatory changes.  No nephrolithiasis. Will proceed to RUQ ultrasound.   On multiple reevaluations, patient continues to have pain, however it was better controlled with Dilaudid.  Nausea controlled with Zofran.  Repeat abdominal exam is unchanged.   Right upper quadrant ultrasound demonstrates sonographic evidence of cholecystitis.  Will consult surgery for further management.  11:12 AM spoke with Jackson Latino, PA-C from [facility] surgery who will come  to evaluate the patient for surgery.  This is a shared visit with Dr. Brantley Stage. Patient was independently evaluated by this attending physician. Attending physician consulted in evaluation and admission management.   ED Discharge Orders    None       Tamala Julian 09/05/17 1713    Forde Dandy, MD 09/06/17 [phone]

## 2017-09-05 NOTE — ED Notes (Signed)
Patient c/o right sided flank pain radiating into abd. States the pain started yest. States he still hurts however laying down is much more tolerable. C/o nausea no vomiting

## 2017-09-05 NOTE — H&P (Signed)
[facility] Admission Note  NOX TALENT 01-18-1976  [id number].    Requesting MD: Brantley Stage Chief Complaint/Reason for Consult: acute cholecystitis  HPI:  Derrick West is a 41yo male PMH DM, who presented to [facility] earlier today with right sided abdominal pain. States that the pain started yesterday. It is constant, severe, and gradually worsening. Radiates into his right flank. Worse with movement or PO intake. Feels slightly better if he lies flat on his back. He reports associated nausea and diarrhea. Denies emesis, fever, chills, dysuria. States that he has never had pain like this before. Upon presentation to the ED his lipase, LFTs, and WBC were found to be WNL. Abdominal u/s showed cholelithiasis with a distended gallbladder, gallbladder wall thickening, and a positive Murphy's sign consistent with acute cholecystitis.  General surgery asked to see. Last meal yesterday   PMH significant for DM Abdominal surgical history: none Anticoagulants: none Nonsmoker Employment: Freight forwarder at [employer] and [employer]  ROS: Review of Systems  Constitutional: Negative.   HENT: Negative.   Eyes: Negative.   Respiratory: Negative.   Cardiovascular: Negative.   Gastrointestinal: Positive for abdominal pain, diarrhea and nausea. Negative for blood in stool, constipation, melena and vomiting.  Genitourinary: Negative.   Musculoskeletal: Negative.   Skin: Negative.   Neurological: Negative.    All systems reviewed and otherwise negative except for as above  No family history on file.  Past Medical History:  Diagnosis Date  . DIABETES MELLITUS, TYPE II 12/24/2007  . GANGLION CYST, WRIST, LEFT 07/29/2007  . Headache(784.0)   . OBESITY 12/24/2007    Past Surgical History:  Procedure Laterality Date  . ANTERIOR CERVICAL DECOMP/DISCECTOMY FUSION N/A 04/15/2013   Procedure: ANTERIOR CERVICAL DECOMPRESSION/DISCECTOMY FUSION 1 LEVEL;  Surgeon: Winfield Cunas, MD;  Location: [facility];   Service: Neurosurgery;  Laterality: N/A;  Cervical Five-Six Anterior Cervical decompression with fusion plating and bonegraft  . LUMBAR LAMINECTOMY    . VSD REPAIR      Social History:  reports that  has never smoked. he has never used smokeless tobacco. He reports that he drinks alcohol. He reports that he uses drugs. Drug: Marijuana.  Allergies:  Allergies  Allergen Reactions  . Amoxicillin     As a child     (Not in a hospital admission)  Prior to Admission medications   Medication Sig Start Date End Date Taking? Authorizing Provider  INVOKANA 300 MG TABS tablet TAKE 1 TABLET BY MOUTH DAILY BEFORE BREAKFAST. 09/02/17  Yes Marletta Lor, MD  metFORMIN (GLUCOPHAGE) 1000 MG tablet TAKE 1 TABLET BY MOUTH 2 TIMES DAILY WITH A MEAL. 12/24/16  Yes Marletta Lor, MD  atorvastatin (LIPITOR) 10 MG tablet Take 1 tablet (10 mg total) by mouth daily. Patient not taking: Reported on 09/05/2017 11/27/16   Marletta Lor, MD  Dulaglutide (TRULICITY) 1.5 YQ/6.5HQ SOPN Inject 1.5 mg into the skin once a week. Patient not taking: Reported on 09/05/2017 12/05/16   Marletta Lor, MD  [facility] VERIO test strip USE TO TEST BLOOD SUGAR ONCE DAILY 09/28/16   Marletta Lor, MD    Blood pressure (!) 150/88, pulse 89, temperature 97.8 F (36.6 C), temperature source Oral, resp. rate 16, SpO2 96 %. Physical Exam: General: pleasant, WD/WN white male who is laying in bed in NAD HEENT: head is normocephalic, atraumatic.  Sclera are noninjected.  Pupils equal and round.  Ears and nose without any masses or lesions.  Mouth is pink and moist.  Dentition fair Heart: regular, rate, and rhythm.  No obvious murmurs, gallops, or rubs noted.  Palpable pedal pulses bilaterally Lungs: CTAB, no wheezes, rhonchi, or rales noted.  Respiratory effort nonlabored Abd: obese, soft, ND, +BS, no masses, hernias, or organomegaly. +TTP RUQ with positive Murphy sign MS: all 4 extremities are symmetrical  with no cyanosis, clubbing, or edema. Skin: warm and dry with no masses, lesions, or rashes Psych: A&Ox3 with an appropriate affect. Neuro: cranial nerves grossly intact, extremity CSM intact bilaterally, normal speech  Results for orders placed or performed during the hospital encounter of 09/05/17 (from the past 48 hour(s))  Lipase, blood     Status: None   Collection Time: 09/05/17  3:43 AM  Result Value Ref Range   Lipase 39 11 - 51 U/L  Comprehensive metabolic panel     Status: Abnormal   Collection Time: 09/05/17  3:43 AM  Result Value Ref Range   Sodium 137 135 - 145 mmol/L   Potassium 3.8 3.5 - 5.1 mmol/L   Chloride 105 101 - 111 mmol/L   CO2 24 22 - 32 mmol/L   Glucose, Bld 245 (H) 65 - 99 mg/dL   BUN 14 6 - 20 mg/dL   Creatinine, Ser 0.93 0.61 - 1.24 mg/dL   Calcium 9.0 8.9 - 10.3 mg/dL   Total Protein 6.6 6.5 - 8.1 g/dL   Albumin 3.8 3.5 - 5.0 g/dL   AST 17 15 - 41 U/L   ALT 28 17 - 63 U/L   Alkaline Phosphatase 83 38 - 126 U/L   Total Bilirubin 0.9 0.3 - 1.2 mg/dL   GFR calc non Af Amer >60 >60 mL/min   GFR calc Af Amer >60 >60 mL/min    Comment: (NOTE) The eGFR has been calculated using the CKD EPI equation. This calculation has not been validated in all clinical situations. eGFR's persistently <60 mL/min signify possible Chronic Kidney Disease.    Anion gap 8 5 - 15  CBC     Status: None   Collection Time: 09/05/17  3:43 AM  Result Value Ref Range   WBC 8.4 4.0 - 10.5 K/uL   RBC 5.79 4.22 - 5.81 MIL/uL   Hemoglobin 16.9 13.0 - 17.0 g/dL   HCT 48.0 39.0 - 52.0 %   MCV 82.9 78.0 - 100.0 fL   MCH 29.2 26.0 - 34.0 pg   MCHC 35.2 30.0 - 36.0 g/dL   RDW 12.7 11.5 - 15.5 %   Platelets 230 150 - 400 K/uL  Urinalysis, Routine w reflex microscopic     Status: Abnormal   Collection Time: 09/05/17  3:48 AM  Result Value Ref Range   Color, Urine STRAW (A) YELLOW   APPearance CLEAR CLEAR   Specific Gravity, Urine 1.031 (H) 1.005 - 1.030   pH 5.0 5.0 - 8.0    Glucose, UA >=500 (A) NEGATIVE mg/dL   Hgb urine dipstick NEGATIVE NEGATIVE   Bilirubin Urine NEGATIVE NEGATIVE   Ketones, ur NEGATIVE NEGATIVE mg/dL   Protein, ur NEGATIVE NEGATIVE mg/dL   Nitrite NEGATIVE NEGATIVE   Leukocytes, UA NEGATIVE NEGATIVE   RBC / HPF 0-5 0 - 5 RBC/hpf   WBC, UA 0-5 0 - 5 WBC/hpf   Bacteria, UA NONE SEEN NONE SEEN   Squamous Epithelial / LPF 0-5 (A) NONE SEEN   Ct Renal Stone Study  Result Date: 09/05/2017 CLINICAL DATA:  41 year old male with right flank pain radiating to the right lower quadrant since noon yesterday. Difficulty urinating.  Nausea without vomiting. EXAM: CT ABDOMEN AND PELVIS WITHOUT CONTRAST TECHNIQUE: Multidetector CT imaging of the abdomen and pelvis was performed following the standard protocol without IV contrast. COMPARISON:  Chest radiographs 11/23/2015. FINDINGS: Lower chest: Mild cardiomegaly and mediastinal lipomatosis. No pericardial effusion. Negative lung bases. No pleural effusion. Hepatobiliary: 14-15 mm gallstone is evident in the gallbladder on series 3, image 25. No pericholecystic inflammation. Negative noncontrast liver. Pancreas: Negative. Spleen: Negative; multiple punctate calcified granulomas. Adrenals/Urinary Tract: Normal adrenal glands. Negative noncontrast kidneys. No nephrolithiasis or perinephric stranding. No right hydronephrosis. On the left an extrarenal pelvis is suspected (normal variant). Both ureters are decompressed to the bladder and appear normal. The urinary bladder is mildly distended but otherwise unremarkable. Stomach/Bowel: Decompressed rectum. Redundant but otherwise negative sigmoid colon. Negative left colon and splenic flexure. Negative transverse colon and hepatic flexure. Negative right colon. Normal appendix (series 3, image 66). Negative terminal ileum. No dilated small bowel. Decompressed stomach and duodenum. No abdominal free fluid or mesenteric stranding. Vascular/Lymphatic: Vascular patency is not  evaluated in the absence of IV contrast. No lymphadenopathy. Reproductive: Negative. Other: No pelvic free fluid.  Mild pelvic lipomatosis. Musculoskeletal: Straightening of the spine and multilevel chronic disc and endplate degeneration. No acute osseous abnormality identified. IMPRESSION: 1. Cholelithiasis. No CT evidence of acute cholecystitis, but Right Upper Quadrant Ultrasound would be more sensitive. 2. Otherwise negative noncontrast CT Abdomen and Pelvis. No urologic calculus or obstructive uropathy. Normal appendix. Electronically Signed   By: Genevie Ann M.D.   On: 09/05/2017 08:01   US Abdomen Limited Ruq  Result Date: 09/05/2017 CLINICAL DATA:  Epigastric pain EXAM: ULTRASOUND ABDOMEN LIMITED RIGHT UPPER QUADRANT COMPARISON:  Abdominal and pelvic CT scan of today's date FINDINGS: Gallbladder: The gallbladder is adequately distended. There is an echogenic mobile shadowing stone measuring 1.7 mm in diameter. There is borderline gallbladder wall thickening. There is a positive sonographic Murphy's sign and there may be a small amount of pericholecystic fluid. Common bile duct: Diameter: 3.3 mm Liver: The hepatic echotexture is increased. The surface contour is smooth. There is no discrete mass or ductal dilation. Portal vein is patent on color Doppler imaging with normal direction of blood flow towards the liver. IMPRESSION: Gallstones with sonographic evidence of acute cholecystitis. Increased hepatic echotexture most compatible with fatty infiltrative change. Electronically Signed   By: David  Martinique M.D.   On: 09/05/2017 10:24      Assessment/Plan DM - SSI  Acute cholecystitis - no prior h/o abdominal surgery - acute onset right sided abdominal pain, nausea, diarrhea - lipase, LFTs, and WBC WNL - Abdominal u/s showed cholelithiasis with a distended gallbladder, gallbladder wall thickening, and a positive Murphy's sign consistent with acute cholecystitis  ID - rocephin VTE - SCDs,  lovenox FEN - IVF, NPO for procedure Foley - none Follow up - TBD  Plan - Patient with acute cholecystitis. Recommend laparoscopic cholecystectomy. Will admit to med-surg. Keep NPO. Patient received a dose of rocephin in the ED.  Wellington Hampshire, [facility] 09/05/2017, 11:42 AM Pager: [phone] Consults: [phone] Mon-Fri 7:00 am-4:30 pm Sat-Sun 7:00 am-11:30 am

## 2017-09-05 NOTE — Transfer of Care (Signed)
Immediate Anesthesia Transfer of Care Note  Patient: Derrick West  Procedure(s) Performed: LAPAROSCOPIC CHOLECYSTECTOMY (N/A Abdomen)  Patient Location: PACU  Anesthesia Type:General  Level of Consciousness: awake, alert , oriented and patient cooperative  Airway & Oxygen Therapy: Patient Spontanous Breathing and Patient connected to nasal cannula oxygen  Post-op Assessment: Report given to RN, Post -op Vital signs reviewed and stable and Patient moving all extremities  Post vital signs: Reviewed and stable  Last Vitals:  Vitals:   09/05/17 0708 09/05/17 1335  BP: (!) 150/88 (!) 163/95  Pulse: 89 98  Resp: 16 20  Temp:  36.8 C  SpO2: 96% 95%    Last Pain:  Vitals:   09/05/17 1030  TempSrc:   PainSc: 9          Complications: No apparent anesthesia complications

## 2017-09-05 NOTE — ED Provider Notes (Signed)
Medical screening examination/treatment/procedure(s) were conducted as a shared visit with non-physician practitioner(s) and myself.  I personally evaluated the patient during the encounter.   EKG Interpretation None      41 year old male with history of diabetes, no prior abdominal surgeries who presents with right-sided abdominal and flank pain.  Onset yesterday associated with vomiting.  No fevers, diarrhea, hematuria, urinary frequency or dysuria.  Afebrile, hemodynamically stable.  With right sided abdominal tenderness, no CVA tenderness.  CT was performed to evaluate for potential nephrolithiasis, but overall shows no acute intra-abdominal processes.  There is cholelithiasis.  Given ongoing persistent pain, right upper quadrant ultrasound was performed showing signs of acute cholecystitis.  Patient given ceftriaxone and general surgery is for operative management and admission.   Forde Dandy, MD 09/05/17 [phone]

## 2017-09-05 NOTE — Anesthesia Postprocedure Evaluation (Signed)
Anesthesia Post Note  Patient: Derrick West  Procedure(s) Performed: LAPAROSCOPIC CHOLECYSTECTOMY (N/A Abdomen)     Patient location during evaluation: PACU Anesthesia Type: General Level of consciousness: awake and alert Pain management: pain level controlled Vital Signs Assessment: post-procedure vital signs reviewed and stable Respiratory status: spontaneous breathing, nonlabored ventilation and respiratory function stable Cardiovascular status: blood pressure returned to baseline and stable Postop Assessment: no apparent nausea or vomiting Anesthetic complications: no    Last Vitals:  Vitals:   09/05/17 1405 09/05/17 1420  BP: (!) 143/88 (!) 152/83  Pulse: 100 (!) 105  Resp: (!) 23 (!) 22  Temp:    SpO2: 96% 95%    Last Pain:  Vitals:   09/05/17 1428  TempSrc:   PainSc: 6                  [name],W. EDMOND

## 2017-09-05 NOTE — Op Note (Signed)
LAPAROSCOPIC CHOLECYSTECTOMY  Procedure Note  Derrick West 09/05/2017   Pre-op Diagnosis: acute cholecystitis with cholelithiasis     Post-op Diagnosis: same  Procedure(s): LAPAROSCOPIC CHOLECYSTECTOMY  Surgeon(s): Coralie Keens, MD  Anesthesia: General  Staff:  Circulator: Nicholos Johns, RN Relief Scrub: Lyndle Herrlich, CST Scrub Person: Paulette Blanch, RN; Zannie Kehr Circulator Assistant: Paulette Blanch, RN  Estimated Blood Loss: Minimal               Specimens: sent to path  Findings: The patient was found to have an acutely inflamed gallbladder.  There was a large stone impacted in the neck of the gallbladder at the cystic duct.  Gallbladder itself was quite friable at the base and tore off from the cystic duct as soon as it was retracted.  Procedure: The patient was brought to the operating room and identified as the correct patient.  He was placed supine on the operating table and general anesthesia was induced.  His abdomen was then prepped and draped in the usual sterile fashion.  I made a small vertical incision above the umbilicus with the scalpel.  I took this down to the fascia which was identified and opened with a scalpel.  A hemostat was then used to pass into the peritoneal cavity under direct vision.  A 0 Vicryl pursestring suture was placed around the fascial opening.  The [facility] port was placed to the opening and insufflation of the abdomen was begun.  I then placed a 5 mm trocar in the patient's epigastrium and 2 more in the right upper quadrant all under direct vision.  The patient was morbidly obese with a large, thickened omentum.  The patient's liver was also large and fatty infiltrated.  With the patient in reverse Trendelenburg, I was able to identify the gallbladder.  It was acutely inflamed.  I grasped the top of the gallbladder and retracted off the liver bed.  The base of the gallbladder appeared slightly necrotic.  As soon as I grasped it and  retracted it, it tore off of the cystic duct from a large stone that was impacted right at the neck of the gallbladder/cystic duct junction.  Identify the cystic artery and achieved a critical end around it.  I clipped it proximally and distally and transected it.  I then identified what appeared to be the remainder of the cystic duct and clipped it with surgical clips.  I then slowly dissected free the gallbladder from the liver bed.  At this point, I appeared to achieve hemostasis in the liver bed with the cautery.  I inserted an Endosac through the umbilical trocar and placed the gallbladder into the Endosac along with a large stone that did come out of the gallbladder neck.  I then removed this to the umbilicus.  I reinserted the umbilical trocar.  Hemostasis appeared to be achieved.  I placed a piece of surgical snow into the gallbladder fossa.  I then placed a 46 [place] Blake drain through the epigastric trocar site and pulled it out through the most lateral right upper quadrant trocar site under direct vision.  I placed a drain into the gallbladder fossa.  I then irrigated the abdomen with normal saline.  Hemostasis appeared to be achieved.  All ports were removed under direct vision and the abdomen was deflated.  I closed with 0 Vicryl at the umbilicus closing the fascial defect.  I sutured the Blake drain in place with a 2-0 nylon suture.  This was placed to bulb suction.  I anesthetized all incisions with Marcaine after removing the rest of the trochars.  I then closed the incisions with 4-0 Monocryl sutures.  Dermabond was then applied.  The patient tolerated the procedure well.  All the counts were correct at the end of the procedure.  The patient was then extubated in the operating room and taken in a stable condition to the recovery room.          Derrick West A   Date: 09/05/2017  Time: 1:25 PM

## 2017-09-05 NOTE — Anesthesia Procedure Notes (Signed)
Procedure Name: Intubation Date/Time: 09/05/2017 12:34 PM Performed by: Izora Gala, CRNA Pre-anesthesia Checklist: Patient identified, Emergency Drugs available, Suction available and Patient being monitored Patient Re-evaluated:Patient Re-evaluated prior to induction Oxygen Delivery Method: Circle system utilized Preoxygenation: Pre-oxygenation with 100% oxygen Induction Type: IV induction Ventilation: Mask ventilation without difficulty and Oral airway inserted - appropriate to patient size Laryngoscope Size: Miller and 3 Grade View: Grade I Tube type: Oral Tube size: 8.0 mm Number of attempts: 1 Airway Equipment and Method: Stylet Placement Confirmation: ETT inserted through vocal cords under direct vision,  positive ETCO2 and breath sounds checked- equal and bilateral Secured at: 22 cm Dental Injury: Teeth and Oropharynx as per pre-operative assessment

## 2017-09-05 NOTE — Anesthesia Preprocedure Evaluation (Addendum)
Anesthesia Evaluation  Patient identified by MRN, date of birth, ID band Patient awake    Reviewed: Allergy & Precautions, H&P , NPO status , Patient's Chart, lab work & pertinent test results  Airway Mallampati: II  TM Distance: >3 FB Neck ROM: Full    Dental no notable dental hx. (+) Teeth Intact, Dental Advisory Given   Pulmonary neg pulmonary ROS,    Pulmonary exam normal breath sounds clear to auscultation       Cardiovascular negative cardio ROS   Rhythm:Regular Rate:Normal     Neuro/Psych  Headaches, negative psych ROS   GI/Hepatic negative GI ROS, Neg liver ROS,   Endo/Other  diabetes, Type 2, Oral Hypoglycemic AgentsMorbid obesity  Renal/GU negative Renal ROS  negative genitourinary   Musculoskeletal   Abdominal   Peds  Hematology negative hematology ROS (+)   Anesthesia Other Findings   Reproductive/Obstetrics negative OB ROS                            Anesthesia Physical Anesthesia Plan  ASA: III  Anesthesia Plan: General   Post-op Pain Management:    Induction: Intravenous, Rapid sequence and Cricoid pressure planned  PONV Risk Score and Plan: 3 and Ondansetron, Dexamethasone and Midazolam  Airway Management Planned: Oral ETT  Additional Equipment:   Intra-op Plan:   Post-operative Plan: Extubation in OR  Informed Consent: I have reviewed the patients History and Physical, chart, labs and discussed the procedure including the risks, benefits and alternatives for the proposed anesthesia with the patient or authorized representative who has indicated his/her understanding and acceptance.   Dental advisory given  Plan Discussed with: CRNA  Anesthesia Plan Comments:        Anesthesia Quick Evaluation

## 2017-09-05 NOTE — Discharge Instructions (Addendum)
Please arrive at least 30 min before your appointment to complete your check in paperwork.  If you are unable to arrive 30 min prior to your appointment time we may have to cancel or reschedule you. ° °LAPAROSCOPIC SURGERY: POST OP INSTRUCTIONS  °1. DIET: Follow a light bland diet the first 24 hours after arrival home, such as soup, liquids, crackers, etc. Be sure to include lots of fluids daily. Avoid fast food or heavy meals as your are more likely to get nauseated. Eat a low fat the next few days after surgery.  °2. Take your usually prescribed home medications unless otherwise directed. °3. PAIN CONTROL:  °1. Pain is best controlled by a usual combination of three different methods TOGETHER:  °1. Ice/Heat °2. Over the counter pain medication °3. Prescription pain medication °2. Most patients will experience some swelling and bruising around the incisions. Ice packs or heating pads (30-60 minutes up to 6 times a day) will help. Use ice for the first few days to help decrease swelling and bruising, then switch to heat to help relax tight/sore spots and speed recovery. Some people prefer to use ice alone, heat alone, alternating between ice & heat. Experiment to what works for you. Swelling and bruising can take several weeks to resolve.  °3. It is helpful to take an over-the-counter pain medication regularly for the first few weeks. Choose one of the following that works best for you:  °1. Naproxen (Aleve, etc) Two 220mg tabs twice a day °2. Ibuprofen (Advil, etc) Three 200mg tabs four times a day (every meal & bedtime) °3. Acetaminophen (Tylenol, etc) 500-650mg four times a day (every meal & bedtime) °4. A prescription for pain medication (such as oxycodone, hydrocodone, etc) should be given to you upon discharge. Take your pain medication as prescribed.  °1. If you are having problems/concerns with the prescription medicine (does not control pain, nausea, vomiting, rash, itching, etc), please call us [phone] to see if we need to switch you to a different pain medicine that will work better for you and/or control your side effect better. °2. If you need a refill on your pain medication, please contact your pharmacy. They will contact our office to request authorization. Prescriptions will not be filled after 5 pm or on week-ends. °4. Avoid getting constipated. Between the surgery and the pain medications, it is common to experience some constipation. Increasing fluid intake and taking a fiber supplement (such as Metamucil, Citrucel, FiberCon, MiraLax, etc) 1-2 times a day regularly will usually help prevent this problem from occurring. A mild laxative (prune juice, Milk of Magnesia, MiraLax, etc) should be taken according to package directions if there are no bowel movements after 48 hours.  °5. Watch out for diarrhea. If you have many loose bowel movements, simplify your diet to bland foods & liquids for a few days. Stop any stool softeners and decrease your fiber supplement. Switching to mild anti-diarrheal medications (Kayopectate, Pepto Bismol) can help. If this worsens or does not improve, please call us. °6. Wash / shower every day. You may shower over the dressings as they are waterproof. Continue to shower over incision(s) after the dressing is off. If there is glue over the incisions try not to pick it off, let it fall off naturally. °7. Remove your waterproof bandages 2 days after surgery. You may leave the incision open to air. You may replace a dressing/Band-Aid to cover the incision for comfort if you wish.  °8. ACTIVITIES as tolerated:  °  1. You may resume regular (light) daily activities beginning the next day--such as daily self-care, walking, climbing stairs--gradually increasing activities as tolerated. If you can walk 30 minutes without difficulty, it is safe to try more intense activity such as jogging, treadmill, bicycling, low-impact aerobics, swimming, etc. 2. Save the most intensive and  strenuous activity for last such as sit-ups, heavy lifting, contact sports, etc Refrain from any heavy lifting or straining until you are off narcotics for pain control. For the first 2-3 weeks do not lift over 10-15lb.  3. DO NOT PUSH THROUGH PAIN. Let pain be your guide: If it hurts to do something, don't do it. Pain is your body warning you to avoid that activity for another week until the pain goes down. 4. You may drive when you are no longer taking prescription pain medication, you can comfortably wear a seatbelt, and you can safely maneuver your car and apply brakes. 5. You may have sexual intercourse when it is comfortable.  9. FOLLOW UP in our office  1. Please call [facility] at [phone] to set up an appointment to see your surgeon in the office for a follow-up appointment approximately 2-3 weeks after your surgery. 2. Make sure that you call for this appointment the day you arrive home to insure a convenient appointment time.      10. IF YOU HAVE DISABILITY OR FAMILY LEAVE FORMS, BRING THEM TO THE               OFFICE FOR PROCESSING.   WHEN TO CALL us [phone]:  1. Poor pain control 2. Reactions / problems with new medications (rash/itching, nausea, etc)  3. Fever over 101.5 F (38.5 C) 4. Inability to urinate 5. Nausea and/or vomiting 6. Worsening swelling or bruising 7. Continued bleeding from incision. 8. Increased pain, redness, or drainage from the incision  The clinic staff is available to answer your questions during regular business hours (8:30am-5pm). Please dont hesitate to call and ask to speak to one of our nurses for clinical concerns.  If you have a medical emergency, go to the nearest emergency room or call 911.  A surgeon from [facility] is always on call at the [facility], [address], [place], [place], [place] [address] ?  MAIN: [phone] ? TOLL FREE: [phone] ?  FAX [phone]) [phone]    [email or web address]    Surgical [facility] Surgical drains are used to remove extra fluid that normally builds up in a surgical wound after surgery. A surgical drain helps to heal a surgical wound. Different kinds of surgical drains include:  Active drains. These drains use suction to pull drainage away from the surgical wound. Drainage flows through a tube to a container outside of the body. It is important to keep the bulb or the drainage container flat (compressed) at all times, except while you empty it. Flattening the bulb or container creates suction. The two most common types of active drains are bulb drains and Hemovac drains.  Passive drains. These drains allow fluid to drain naturally, by gravity. Drainage flows through a tube to a bandage (dressing) or a container outside of the body. Passive drains do not need to be emptied. The most common type of passive drain is the Penrose drain.  A drain is placed during surgery. Immediately after surgery, drainage is usually bright red and a little thicker than water. The drainage may gradually turn yellow or pink  and become thinner. It is likely that your health care provider will remove the drain when the drainage stops or when the amount decreases to 1-2 Tbsp (15-30 mL) during a 24-hour period. How to care for your surgical drain  Keep the skin around the drain dry and covered with a dressing at all times.  Check your drain area every day for signs of infection. Check for: ? More redness, swelling, or pain. ? Pus or a bad smell. ? Cloudy drainage. Follow instructions from your health care provider about how to take care of your drain and how to change your dressing. Change your dressing at least one time every day. Change it more often if needed to keep the dressing dry. Make sure you: 1. Gather your supplies, including: ? Tape. ? Germ-free cleaning solution (sterile saline). ? Split gauze drain sponge: 4 x 4 inches (10  x 10 cm). ? Gauze square: 4 x 4 inches (10 x 10 cm). 2. Wash your hands with soap and water before you change your dressing. If soap and water are not available, use hand sanitizer. 3. Remove the old dressing. Avoid using scissors to do that. 4. Use sterile saline to clean your skin around the drain. 5. Place the tube through the slit in a drain sponge. Place the drain sponge so that it covers your wound. 6. Place the gauze square or another drain sponge on top of the drain sponge that is on the wound. Make sure the tube is between those layers. 7. Tape the dressing to your skin. 8. If you have an active bulb or Hemovac drain, tape the drainage tube to your skin 1-2 inches (2.5-5 cm) below the place where the tube enters your body. Taping keeps the tube from pulling on any stitches (sutures) that you have. 9. Wash your hands with soap and water. 10. Write down the color of your drainage and how often you change your dressing.  How to empty your active bulb or Hemovac drain 1. Make sure that you have a measuring cup that you can empty your drainage into. 2. Wash your hands with soap and water. If soap and water are not available, use hand sanitizer. 3. Gently move your fingers down the tube while squeezing very lightly. This is called stripping the tube. This clears any drainage, clots, or tissue from the tube. ? Do not pull on the tube. ? You may need to strip the tube several times every day to keep the tube clear. 4. Open the bulb cap or the drain plug. Do not touch the inside of the cap or the bottom of the plug. 5. Empty all of the drainage into the measuring cup. 6. Compress the bulb or the container and replace the cap or the plug. To compress the bulb or the container, squeeze it firmly in the middle while you close the cap or plug the container. 7. Write down the amount of drainage that you have in each 24-hour period. If you have less than 2 Tbsp (30 mL) of drainage during 24 hours,  contact your health care provider. 8. Flush the drainage down the toilet. 9. Wash your hands with soap and water. Contact a health care provider if:  You have more redness, swelling, or pain around your drain area.  The amount of drainage that you have is increasing instead of decreasing.  You have pus or a bad smell coming from your drain area.  You have a fever.  You have drainage  that is cloudy.  There is a sudden stop or a sudden decrease in the amount of drainage that you have.  Your tube falls out.  Your active draindoes not stay compressedafter you empty it. This information is not intended to replace advice given to you by your health care provider. Make sure you discuss any questions you have with your health care provider. Document Released: 09/07/2000 Document Revised: 02/16/2016 Document Reviewed: 03/30/2015 Elsevier Interactive Patient Education  2018 [employer].

## 2017-09-05 NOTE — ED Triage Notes (Signed)
Patient reports upper abdominal pain with nausea and right lower back pain onset yesterday afternoon , denies emesis , no diarrhea or fever .

## 2017-09-06 ENCOUNTER — Encounter (HOSPITAL_COMMUNITY): Payer: Self-pay | Admitting: Surgery

## 2017-09-06 LAB — COMPREHENSIVE METABOLIC PANEL
ALT: 51 U/L (ref 17–63)
AST: 31 U/L (ref 15–41)
Albumin: 3.4 g/dL — ABNORMAL LOW (ref 3.5–5.0)
Alkaline Phosphatase: 68 U/L (ref 38–126)
Anion gap: 11 (ref 5–15)
BUN: 11 mg/dL (ref 6–20)
CO2: 20 mmol/L — ABNORMAL LOW (ref 22–32)
Calcium: 8.7 mg/dL — ABNORMAL LOW (ref 8.9–10.3)
Chloride: 107 mmol/L (ref 101–111)
Creatinine, Ser: 0.95 mg/dL (ref 0.61–1.24)
GFR calc Af Amer: >60 mL/min (ref >60)
GFR calc non Af Amer: >60 mL/min (ref >60)
Glucose, Bld: 113 mg/dL — ABNORMAL HIGH (ref 65–99)
Potassium: 4.1 mmol/L (ref 3.5–5.1)
Sodium: 138 mmol/L (ref 135–145)
Total Bilirubin: 1.1 mg/dL (ref 0.3–1.2)
Total Protein: 6 g/dL — ABNORMAL LOW (ref 6.5–8.1)

## 2017-09-06 LAB — CBC
HCT: 45.2 % (ref 39.0–52.0)
Hemoglobin: 15.5 g/dL (ref 13.0–17.0)
MCH: 29.1 pg (ref 26.0–34.0)
MCHC: 34.3 g/dL (ref 30.0–36.0)
MCV: 84.8 fL (ref 78.0–100.0)
Platelets: 255 10*3/uL (ref 150–400)
RBC: 5.33 MIL/uL (ref 4.22–5.81)
RDW: 13.1 % (ref 11.5–15.5)
WBC: 15.6 10*3/uL — ABNORMAL HIGH (ref 4.0–10.5)

## 2017-09-06 LAB — HIV ANTIBODY (ROUTINE TESTING W REFLEX): HIV Screen 4th Generation wRfx: NONREACTIVE

## 2017-09-06 LAB — GLUCOSE, CAPILLARY
Glucose-Capillary: 100 mg/dL — ABNORMAL HIGH (ref 65–99)
Glucose-Capillary: 135 mg/dL — ABNORMAL HIGH (ref 65–99)

## 2017-09-06 MED ORDER — OXYCODONE HCL 5 MG PO TABS: 5.0000 mg | ORAL_TABLET | Freq: Four times a day (QID) | ORAL | 0 refills | Status: DC | PRN

## 2017-09-06 NOTE — Progress Notes (Signed)
[facility]/Trauma Progress Note  1 Day Post-Op   Assessment/Plan DM - SSI  Acute Cholecystitis  - S/P laparoscopic cholecystectomy, Dr. Ninfa Linden, 12/13  FEN: carb mod VTE: SCD's, lovenox ID: Rocephin 12/13 Foley: none Follow up: Dr. Ninfa Linden 1 week  DISPO: Will check pt in the afternoon and he maybe discharged later today    LOS: 0 days    Subjective:  CC: abdominal soreness  No nausea or vomiting. Having flatus. Has been up to the bathroom and walked last night. Tolerated clears liquids. Would like to go home later if possible  Objective: Vital signs in last 24 hours: Temp:  [98 F (36.7 C)-98.7 F (37.1 C)] 98 F (36.7 C) (12/13 2018) Pulse Rate:  [98-112] 110 (12/13 2018) Resp:  [17-25] 18 (12/13 2018) BP: (136-165)/(83-95) 136/83 (12/13 2018) SpO2:  [93 %-97 %] 95 % (12/13 2018) Weight:  [245 lb (111.1 kg)] 245 lb (111.1 kg) (12/13 1211) Last BM Date: 09/04/17  Intake/Output from previous day: 12/13 0701 - 12/14 0700 In: 4460 [P.O.:360; I.V.:2050; IV Piggyback:2050] Out: 20 [Drains:20] Intake/Output this shift: No intake/output data recorded.  PE: Gen:  Alert, NAD, pleasant, cooperative Card:  RRR, no M/G/R heard Pulm:  CTA, no W/R/R, effort normal Abd: Soft, not distended, +BS, no HSM, incisions C/D/I, drain with minimal serosanguinous drainage, appropriately tender, no guarding Skin: no rashes noted, warm and dry   Anti-infectives: Anti-infectives (From admission, onward)   Start     Dose/Rate Route Frequency Ordered Stop   09/05/17 1100  cefTRIAXone (ROCEPHIN) 2 g in dextrose 5 % 50 mL IVPB     2 g 100 mL/hr over 30 Minutes Intravenous  Once 09/05/17 1050 09/05/17 1129      Lab Results:  Recent Labs    09/05/17 1152 09/06/17 0507  WBC 10.9* 15.6*  HGB 16.1 15.5  HCT 46.2 45.2  PLT 223 255   BMET Recent Labs    09/05/17 0343 09/05/17 1152 09/06/17 0507  NA 137  --  138  K 3.8  --  4.1  CL 105  --  107  CO2 24  --   20*  GLUCOSE 245*  --  113*  BUN 14  --  11  CREATININE 0.93 0.78 0.95  CALCIUM 9.0  --  8.7*   PT/INR No results for input(s): LABPROT, INR in the last 72 hours. CMP     Component Value Date/Time   NA 138 09/06/2017 0507   K 4.1 09/06/2017 0507   CL 107 09/06/2017 0507   CO2 20 (L) 09/06/2017 0507   GLUCOSE 113 (H) 09/06/2017 0507   BUN 11 09/06/2017 0507   CREATININE 0.95 09/06/2017 0507   CALCIUM 8.7 (L) 09/06/2017 0507   PROT 6.0 (L) 09/06/2017 0507   ALBUMIN 3.4 (L) 09/06/2017 0507   AST 31 09/06/2017 0507   ALT 51 09/06/2017 0507   ALKPHOS 68 09/06/2017 0507   BILITOT 1.1 09/06/2017 0507   GFRNONAA >60 09/06/2017 0507   GFRAA >60 09/06/2017 0507   Lipase     Component Value Date/Time   LIPASE 39 09/05/2017 0343    Studies/Results: Ct Renal Stone Study  Result Date: 09/05/2017 CLINICAL DATA:  41 year old male with right flank pain radiating to the right lower quadrant since noon yesterday. Difficulty urinating. Nausea without vomiting. EXAM: CT ABDOMEN AND PELVIS WITHOUT CONTRAST TECHNIQUE: Multidetector CT imaging of the abdomen and pelvis was performed following the standard protocol without IV contrast. COMPARISON:  Chest radiographs 11/23/2015. FINDINGS: Lower chest:  Mild cardiomegaly and mediastinal lipomatosis. No pericardial effusion. Negative lung bases. No pleural effusion. Hepatobiliary: 14-15 mm gallstone is evident in the gallbladder on series 3, image 25. No pericholecystic inflammation. Negative noncontrast liver. Pancreas: Negative. Spleen: Negative; multiple punctate calcified granulomas. Adrenals/Urinary Tract: Normal adrenal glands. Negative noncontrast kidneys. No nephrolithiasis or perinephric stranding. No right hydronephrosis. On the left an extrarenal pelvis is suspected (normal variant). Both ureters are decompressed to the bladder and appear normal. The urinary bladder is mildly distended but otherwise unremarkable. Stomach/Bowel: Decompressed  rectum. Redundant but otherwise negative sigmoid colon. Negative left colon and splenic flexure. Negative transverse colon and hepatic flexure. Negative right colon. Normal appendix (series 3, image 66). Negative terminal ileum. No dilated small bowel. Decompressed stomach and duodenum. No abdominal free fluid or mesenteric stranding. Vascular/Lymphatic: Vascular patency is not evaluated in the absence of IV contrast. No lymphadenopathy. Reproductive: Negative. Other: No pelvic free fluid.  Mild pelvic lipomatosis. Musculoskeletal: Straightening of the spine and multilevel chronic disc and endplate degeneration. No acute osseous abnormality identified. IMPRESSION: 1. Cholelithiasis. No CT evidence of acute cholecystitis, but Right Upper Quadrant Ultrasound would be more sensitive. 2. Otherwise negative noncontrast CT Abdomen and Pelvis. No urologic calculus or obstructive uropathy. Normal appendix. Electronically Signed   By: Genevie Ann M.D.   On: 09/05/2017 08:01   US Abdomen Limited Ruq  Result Date: 09/05/2017 CLINICAL DATA:  Epigastric pain EXAM: ULTRASOUND ABDOMEN LIMITED RIGHT UPPER QUADRANT COMPARISON:  Abdominal and pelvic CT scan of today's date FINDINGS: Gallbladder: The gallbladder is adequately distended. There is an echogenic mobile shadowing stone measuring 1.7 mm in diameter. There is borderline gallbladder wall thickening. There is a positive sonographic Murphy's sign and there may be a small amount of pericholecystic fluid. Common bile duct: Diameter: 3.3 mm Liver: The hepatic echotexture is increased. The surface contour is smooth. There is no discrete mass or ductal dilation. Portal vein is patent on color Doppler imaging with normal direction of blood flow towards the liver. IMPRESSION: Gallstones with sonographic evidence of acute cholecystitis. Increased hepatic echotexture most compatible with fatty infiltrative change. Electronically Signed   By: David  Martinique M.D.   On: 09/05/2017 10:24       Kalman Drape , [facility] 09/06/2017, 8:22 AM Pager: [phone] Consults: [phone] Mon-Fri 7:00 am-4:30 pm Sat-Sun 7:00 am-11:30 am

## 2017-09-06 NOTE — Discharge Summary (Signed)
[facility] Discharge Summary   Patient ID: Derrick West MRN: [id number] DOB/AGE: 01/09/1976 41 y.o.  Admit date: 09/05/2017 Discharge date: 09/06/2017  Admitting Diagnosis: Acute cholecystitis  Discharge Diagnosis Patient Active Problem List   Diagnosis Date Noted  . Acute cholecystitis 09/05/2017  . Acute calculous cholecystitis 09/05/2017  . Adjustment disorder with mixed anxiety and depressed mood 12/23/2014  . Neck pain 11/24/2012  . Diabetes mellitus without complication ([place]) 16/09/930  . OBESITY 12/24/2007  . GANGLION CYST, WRIST, LEFT 07/29/2007    Consultants None  Imaging: Ct Renal Stone Study  Result Date: 09/05/2017 CLINICAL DATA:  41 year old male with right flank pain radiating to the right lower quadrant since noon yesterday. Difficulty urinating. Nausea without vomiting. EXAM: CT ABDOMEN AND PELVIS WITHOUT CONTRAST TECHNIQUE: Multidetector CT imaging of the abdomen and pelvis was performed following the standard protocol without IV contrast. COMPARISON:  Chest radiographs 11/23/2015. FINDINGS: Lower chest: Mild cardiomegaly and mediastinal lipomatosis. No pericardial effusion. Negative lung bases. No pleural effusion. Hepatobiliary: 14-15 mm gallstone is evident in the gallbladder on series 3, image 25. No pericholecystic inflammation. Negative noncontrast liver. Pancreas: Negative. Spleen: Negative; multiple punctate calcified granulomas. Adrenals/Urinary Tract: Normal adrenal glands. Negative noncontrast kidneys. No nephrolithiasis or perinephric stranding. No right hydronephrosis. On the left an extrarenal pelvis is suspected (normal variant). Both ureters are decompressed to the bladder and appear normal. The urinary bladder is mildly distended but otherwise unremarkable. Stomach/Bowel: Decompressed rectum. Redundant but otherwise negative sigmoid colon. Negative left colon and splenic flexure. Negative transverse colon and hepatic flexure. Negative  right colon. Normal appendix (series 3, image 66). Negative terminal ileum. No dilated small bowel. Decompressed stomach and duodenum. No abdominal free fluid or mesenteric stranding. Vascular/Lymphatic: Vascular patency is not evaluated in the absence of IV contrast. No lymphadenopathy. Reproductive: Negative. Other: No pelvic free fluid.  Mild pelvic lipomatosis. Musculoskeletal: Straightening of the spine and multilevel chronic disc and endplate degeneration. No acute osseous abnormality identified. IMPRESSION: 1. Cholelithiasis. No CT evidence of acute cholecystitis, but Right Upper Quadrant Ultrasound would be more sensitive. 2. Otherwise negative noncontrast CT Abdomen and Pelvis. No urologic calculus or obstructive uropathy. Normal appendix. Electronically Signed   By: Genevie Ann M.D.   On: 09/05/2017 08:01   US Abdomen Limited Ruq  Result Date: 09/05/2017 CLINICAL DATA:  Epigastric pain EXAM: ULTRASOUND ABDOMEN LIMITED RIGHT UPPER QUADRANT COMPARISON:  Abdominal and pelvic CT scan of today's date FINDINGS: Gallbladder: The gallbladder is adequately distended. There is an echogenic mobile shadowing stone measuring 1.7 mm in diameter. There is borderline gallbladder wall thickening. There is a positive sonographic Murphy's sign and there may be a small amount of pericholecystic fluid. Common bile duct: Diameter: 3.3 mm Liver: The hepatic echotexture is increased. The surface contour is smooth. There is no discrete mass or ductal dilation. Portal vein is patent on color Doppler imaging with normal direction of blood flow towards the liver. IMPRESSION: Gallstones with sonographic evidence of acute cholecystitis. Increased hepatic echotexture most compatible with fatty infiltrative change. Electronically Signed   By: David  Martinique M.D.   On: 09/05/2017 10:24    Procedures Dr. Ninfa Linden (09/05/17) - Laparoscopic Cholecystectomy  Hospital Course:  Derrick West is a 41yo male PMH DM who [facility] to [facility]  12/13 with 1 day of gradually worsening RUQ abdominal pain.  Workup showed acute cholecystitis on ultrasound without elevated LFTs or lipase.  Patient was admitted and underwent procedure listed above.  Intraoperatively the patient was found to have an  acutely inflamed gallbladder; there was a large stone impacted in the neck of the gallbladder at the cystic duct; gallbladder itself was quite friable at the base and tore off from the cystic duct as soon as it was retracted. A JP drain was left. Tolerated procedure well and was transferred to the floor.  LFTs were WNL POD#1. Diet was advanced as tolerated.  On POD1 the patient was voiding well, tolerating diet, ambulating well, pain well controlled, vital signs stable, incisions c/d/i and felt stable for discharge home. He will go home with the JP drain. Patient will follow up in our office next week and knows to call with questions or concerns.    I have personally reviewed the patients medication history on the [place] controlled substance database.     Allergies as of 09/06/2017      Reactions   Amoxicillin    As a child      Medication List    TAKE these medications   atorvastatin 10 MG tablet Commonly known as:  LIPITOR Take 1 tablet (10 mg total) by mouth daily.   Dulaglutide 1.5 MG/0.5ML Sopn Commonly known as:  TRULICITY Inject 1.5 mg into the skin once a week.   INVOKANA 300 MG Tabs tablet Generic drug:  canagliflozin TAKE 1 TABLET BY MOUTH DAILY BEFORE BREAKFAST.   metFORMIN 1000 MG tablet Commonly known as:  GLUCOPHAGE TAKE 1 TABLET BY MOUTH 2 TIMES DAILY WITH A MEAL.   ONETOUCH VERIO test strip Generic drug:  glucose blood USE TO TEST BLOOD SUGAR ONCE DAILY   oxyCODONE 5 MG immediate release tablet Commonly known as:  Oxy IR/ROXICODONE Take 1 tablet (5 mg total) by mouth every 6 (six) hours as needed for severe pain.        Follow-up [facility], [place]. Go on 09/13/2017.   Specialty:   General Surgery Why:  Your appointment is 09/13/17 at 8:50am. Please arrive 30 minutes prior to your appointment. Bring insurance information and photo ID. Contact information: [address] [place] [phone]          Signed: Wellington Hampshire, [facility] 09/06/2017, 12:40 PM Pager: [phone] Consults: [phone] Mon-Fri 7:00 am-4:30 pm Sat-Sun 7:00 am-11:30 am

## 2017-09-13 ENCOUNTER — Other Ambulatory Visit: Payer: Self-pay | Admitting: Surgery

## 2017-09-13 ENCOUNTER — Inpatient Hospital Stay (HOSPITAL_COMMUNITY)
Admission: AD | Admit: 2017-09-13 | Discharge: 2017-09-23 | DRG: 393 | Disposition: A | Discharge status: Home / Self Care | Payer: 59 | Attending: Surgery | Admitting: Surgery

## 2017-09-13 ENCOUNTER — Ambulatory Visit (INDEPENDENT_AMBULATORY_CARE_PROVIDER_SITE_OTHER): Payer: 59 | Admitting: Internal Medicine

## 2017-09-13 ENCOUNTER — Encounter: Payer: Self-pay | Admitting: *Deleted

## 2017-09-13 VITALS — BP 128/90 | HR 96 | Ht 67.5 in | Wt 231.1 lb

## 2017-09-13 DIAGNOSIS — K760 Fatty (change of) liver, not elsewhere classified: Secondary | ICD-10-CM | POA: Diagnosis present

## 2017-09-13 DIAGNOSIS — K8581 Other acute pancreatitis with uninfected necrosis: Secondary | ICD-10-CM

## 2017-09-13 DIAGNOSIS — Z7984 Long term (current) use of oral hypoglycemic drugs: Secondary | ICD-10-CM

## 2017-09-13 DIAGNOSIS — K9189 Other postprocedural complications and disorders of digestive system: Secondary | ICD-10-CM

## 2017-09-13 DIAGNOSIS — K838 Other specified diseases of biliary tract: Secondary | ICD-10-CM

## 2017-09-13 DIAGNOSIS — Z79899 Other long term (current) drug therapy: Secondary | ICD-10-CM

## 2017-09-13 DIAGNOSIS — M25511 Pain in right shoulder: Secondary | ICD-10-CM | POA: Diagnosis not present

## 2017-09-13 DIAGNOSIS — K839 Disease of biliary tract, unspecified: Secondary | ICD-10-CM

## 2017-09-13 DIAGNOSIS — Y836 Removal of other organ (partial) (total) as the cause of abnormal reaction of the patient, or of later complication, without mention of misadventure at the time of the procedure: Secondary | ICD-10-CM | POA: Diagnosis present

## 2017-09-13 DIAGNOSIS — Z880 Allergy status to penicillin: Secondary | ICD-10-CM

## 2017-09-13 DIAGNOSIS — R1011 Right upper quadrant pain: Secondary | ICD-10-CM

## 2017-09-13 DIAGNOSIS — J9811 Atelectasis: Secondary | ICD-10-CM | POA: Diagnosis not present

## 2017-09-13 DIAGNOSIS — F329 Major depressive disorder, single episode, unspecified: Secondary | ICD-10-CM | POA: Diagnosis present

## 2017-09-13 DIAGNOSIS — F419 Anxiety disorder, unspecified: Secondary | ICD-10-CM | POA: Diagnosis present

## 2017-09-13 DIAGNOSIS — Z9049 Acquired absence of other specified parts of digestive tract: Secondary | ICD-10-CM

## 2017-09-13 DIAGNOSIS — D72829 Elevated white blood cell count, unspecified: Secondary | ICD-10-CM

## 2017-09-13 DIAGNOSIS — R1010 Upper abdominal pain, unspecified: Secondary | ICD-10-CM

## 2017-09-13 DIAGNOSIS — R140 Abdominal distension (gaseous): Secondary | ICD-10-CM | POA: Diagnosis not present

## 2017-09-13 DIAGNOSIS — Z833 Family history of diabetes mellitus: Secondary | ICD-10-CM

## 2017-09-13 DIAGNOSIS — R000 Tachycardia, unspecified: Secondary | ICD-10-CM | POA: Diagnosis not present

## 2017-09-13 DIAGNOSIS — E119 Type 2 diabetes mellitus without complications: Secondary | ICD-10-CM | POA: Diagnosis present

## 2017-09-13 DIAGNOSIS — E669 Obesity, unspecified: Secondary | ICD-10-CM | POA: Diagnosis present

## 2017-09-13 DIAGNOSIS — Z6835 Body mass index (BMI) 35.0-35.9, adult: Secondary | ICD-10-CM

## 2017-09-13 LAB — COMPREHENSIVE METABOLIC PANEL
ALT: 24 U/L (ref 17–63)
AST: 20 U/L (ref 15–41)
Albumin: 3.5 g/dL (ref 3.5–5.0)
Alkaline Phosphatase: 74 U/L (ref 38–126)
Anion gap: 6 (ref 5–15)
BUN: 12 mg/dL (ref 6–20)
CO2: 26 mmol/L (ref 22–32)
Calcium: 8.9 mg/dL (ref 8.9–10.3)
Chloride: 105 mmol/L (ref 101–111)
Creatinine, Ser: 0.99 mg/dL (ref 0.61–1.24)
GFR calc Af Amer: >60 mL/min (ref >60)
GFR calc non Af Amer: >60 mL/min (ref >60)
Glucose, Bld: 216 mg/dL — ABNORMAL HIGH (ref 65–99)
Potassium: 3.8 mmol/L (ref 3.5–5.1)
Sodium: 137 mmol/L (ref 135–145)
Total Bilirubin: 0.7 mg/dL (ref 0.3–1.2)
Total Protein: 6.3 g/dL — ABNORMAL LOW (ref 6.5–8.1)

## 2017-09-13 LAB — CBC
HCT: 44.5 % (ref 39.0–52.0)
Hemoglobin: 15.5 g/dL (ref 13.0–17.0)
MCH: 29.1 pg (ref 26.0–34.0)
MCHC: 34.8 g/dL (ref 30.0–36.0)
MCV: 83.6 fL (ref 78.0–100.0)
Platelets: 258 10*3/uL (ref 150–400)
RBC: 5.32 MIL/uL (ref 4.22–5.81)
RDW: 13.1 % (ref 11.5–15.5)
WBC: 9 10*3/uL (ref 4.0–10.5)

## 2017-09-13 LAB — PROTIME-INR
INR: 0.97
Prothrombin Time: 12.8 seconds (ref 11.4–15.2)

## 2017-09-13 LAB — GLUCOSE, CAPILLARY: Glucose-Capillary: 252 mg/dL — ABNORMAL HIGH (ref 65–99)

## 2017-09-13 MED ORDER — POTASSIUM CHLORIDE IN NACL 20-0.9 MEQ/L-% IV SOLN
INTRAVENOUS | Status: DC
  Administered 2017-09-14 – 2017-09-15 (×2): via INTRAVENOUS
  Filled 2017-09-13 (×2): qty 1000

## 2017-09-13 MED ORDER — ONDANSETRON 4 MG PO TBDP
4.0000 mg | ORAL_TABLET | Freq: Four times a day (QID) | ORAL | Status: DC | PRN
  Administered 2017-09-17: 4 mg via ORAL
  Filled 2017-09-13: qty 1

## 2017-09-13 MED ORDER — ONDANSETRON HCL 4 MG/2ML IJ SOLN
4.0000 mg | Freq: Four times a day (QID) | INTRAMUSCULAR | Status: DC | PRN
  Administered 2017-09-15 – 2017-09-21 (×22): 4 mg via INTRAVENOUS
  Filled 2017-09-13 (×25): qty 2

## 2017-09-13 MED ORDER — SODIUM CHLORIDE 0.9 % IV SOLN: INTRAVENOUS | Status: DC

## 2017-09-13 MED ORDER — OXYCODONE HCL 5 MG PO TABS
5.0000 mg | ORAL_TABLET | ORAL | Status: DC | PRN
  Administered 2017-09-13 – 2017-09-22 (×36): 10 mg via ORAL
  Administered 2017-09-22: 5 mg via ORAL
  Administered 2017-09-23: 10 mg via ORAL
  Filled 2017-09-13 (×7): qty 2
  Filled 2017-09-13: qty 1
  Filled 2017-09-13 (×33): qty 2

## 2017-09-13 MED ORDER — INSULIN ASPART 100 UNIT/ML ~~LOC~~ SOLN
0.0000 [IU] | Freq: Three times a day (TID) | SUBCUTANEOUS | Status: DC
  Administered 2017-09-14: 3 [IU] via SUBCUTANEOUS
  Administered 2017-09-14 – 2017-09-16 (×7): 4 [IU] via SUBCUTANEOUS
  Administered 2017-09-16: 3 [IU] via SUBCUTANEOUS
  Administered 2017-09-17 – 2017-09-18 (×4): 4 [IU] via SUBCUTANEOUS
  Administered 2017-09-18: 15 [IU] via SUBCUTANEOUS
  Administered 2017-09-19 (×2): 4 [IU] via SUBCUTANEOUS
  Administered 2017-09-19: 3 [IU] via SUBCUTANEOUS
  Administered 2017-09-20 (×2): 4 [IU] via SUBCUTANEOUS
  Administered 2017-09-20 – 2017-09-21 (×2): 3 [IU] via SUBCUTANEOUS
  Administered 2017-09-21: 4 [IU] via SUBCUTANEOUS
  Administered 2017-09-22 – 2017-09-23 (×2): 3 [IU] via SUBCUTANEOUS

## 2017-09-13 MED ORDER — MORPHINE SULFATE (PF) 4 MG/ML IV SOLN
1.0000 mg | INTRAVENOUS | Status: DC | PRN
  Administered 2017-09-14 (×3): 4 mg via INTRAVENOUS
  Filled 2017-09-13 (×3): qty 1

## 2017-09-13 MED ORDER — ENOXAPARIN SODIUM 40 MG/0.4ML ~~LOC~~ SOLN
40.0000 mg | SUBCUTANEOUS | Status: DC
  Administered 2017-09-14 – 2017-09-22 (×9): 40 mg via SUBCUTANEOUS
  Filled 2017-09-13 (×9): qty 0.4

## 2017-09-13 NOTE — H&P (Addendum)
Derrick West is an 41 y.o. male.   Chief Complaint: Abdominal pain HPI: Derrick West is status post laparoscopic cholecystectomy by Dr. Ninfa Linden on 09/05/17 for acute cholecystitis.  A surgical drain was left at that time.  At home, he has had right upper quadrant abdominal pain and bilious drainage from his drain.  He was seen by Dr. Ninfa Linden in the office today and diagnosed with a likely postoperative bile leak.  He was seen by gastroenterology and arrangements have been made for him to undergo ERCP tomorrow.  Past Medical History:  Diagnosis Date  . Anxiety and depression   . Cholecystitis   . DIABETES MELLITUS, TYPE II 12/24/2007  . Fatty liver   . GANGLION CYST, WRIST, LEFT 07/29/2007  . Headache(784.0)   . OBESITY 12/24/2007    Past Surgical History:  Procedure Laterality Date  . ANTERIOR CERVICAL DECOMP/DISCECTOMY FUSION N/A 04/15/2013   Procedure: ANTERIOR CERVICAL DECOMPRESSION/DISCECTOMY FUSION 1 LEVEL;  Surgeon: Winfield Cunas, MD;  Location: [facility];  Service: Neurosurgery;  Laterality: N/A;  Cervical Five-Six Anterior Cervical decompression with fusion plating and bonegraft  . CHOLECYSTECTOMY N/A 09/05/2017   Procedure: LAPAROSCOPIC CHOLECYSTECTOMY;  Surgeon: Coralie Keens, MD;  Location: [facility];  Service: General;  Laterality: N/A;  . LUMBAR LAMINECTOMY    . VSD REPAIR     age 33    Family History  Problem Relation Age of Onset  . Diabetes Mother    Social History:  reports that  has never smoked. he has never used smokeless tobacco. He reports that he drinks alcohol. He reports that he uses drugs. Drug: Marijuana.  Allergies:  Allergies  Allergen Reactions  . Amoxicillin     As a child    Medications Prior to Admission  Medication Sig Dispense Refill  . atorvastatin (LIPITOR) 10 MG tablet Take 1 tablet (10 mg total) by mouth daily. 90 tablet 3  . Dulaglutide (TRULICITY) 1.5 ZO/1.0RU SOPN Inject 1.5 mg into the skin once a week. (Patient not taking: Reported on  09/05/2017) 4 pen 6  . INVOKANA 300 MG TABS tablet TAKE 1 TABLET BY MOUTH DAILY BEFORE BREAKFAST. 30 tablet 2  . metFORMIN (GLUCOPHAGE) 1000 MG tablet TAKE 1 TABLET BY MOUTH 2 TIMES DAILY WITH A MEAL. 180 tablet 3  . ONETOUCH VERIO test strip USE TO TEST BLOOD SUGAR ONCE DAILY 100 each 4  . oxyCODONE (OXY IR/ROXICODONE) 5 MG immediate release tablet Take 1 tablet (5 mg total) by mouth every 6 (six) hours as needed for severe pain. 20 tablet 0    No results found for this or any previous visit (from the past 48 hour(s)). No results found.  Review of Systems  Constitutional: Negative for chills and fever.  HENT: Negative.   Eyes: Negative.   Respiratory: Negative for shortness of breath and wheezing.   Cardiovascular: Negative for chest pain.  Gastrointestinal: Positive for abdominal pain. Negative for nausea and vomiting.  Genitourinary: Negative.   Musculoskeletal: Negative.   Skin: Negative.   Neurological: Negative.   Endo/Heme/Allergies: Negative.   Psychiatric/Behavioral: Negative.     Blood pressure 139/88, pulse 92, temperature 99.4 F (37.4 C), temperature source Oral, resp. rate 18, height 5\' 8"  (1.727 m), weight 104.9 kg (231 lb 4.2 oz), SpO2 95 %. Physical Exam  Constitutional: He is oriented to person, place, and time. He appears well-developed and well-nourished.  HENT:  Head: Normocephalic.  Right Ear: External ear normal.  Left Ear: External ear normal.  Nose: Nose normal.  Mouth/Throat: Oropharynx is clear and moist.  Eyes: EOM are normal. Pupils are equal, round, and reactive to light.  Neck: Neck supple.  Cardiovascular: Normal rate and normal heart sounds.  Respiratory: Effort normal and breath sounds normal. No respiratory distress. He has no wheezes. He has no rales.  GI: Soft. Bowel sounds are normal. He exhibits no distension. There is no tenderness.  Incisions CDI, JP with bile  Musculoskeletal: Normal range of motion.  Neurological: He is alert and  oriented to person, place, and time.  Psychiatric: He has a normal mood and affect.     Assessment/Plan Postoperative bile leak - for ERCP tomorrow, NPO after midnight Diabetes mellitus - SSI  Zenovia Jarred, MD 09/13/2017, 6:13 PM

## 2017-09-13 NOTE — Patient Instructions (Signed)
If you are age 43 or older, your body mass index should be between 23-30. Your Body mass index is 35.66 kg/m. If this is out of the aforementioned range listed, please consider follow up with your Primary Care Provider.  If you are age 51 or younger, your body mass index should be between 19-25. Your Body mass index is 35.66 kg/m. If this is out of the aformentioned range listed, please consider follow up with your Primary Care Provider.   General surgery will call you when a bed is available for you at [facility].  Dr Carlean Purl will do an ERCP on you on 09/14/17.  If you are age 32 or older, your body mass index should be between 23-30. Your Body mass index is 35.66 kg/m. If this is out of the aforementioned range listed, please consider follow up with your Primary Care Provider.  If you are age 2 or younger, your body mass index should be between 19-25. Your Body mass index is 35.66 kg/m. If this is out of the aformentioned range listed, please consider follow up with your Primary Care Provider.

## 2017-09-13 NOTE — Progress Notes (Signed)
Patient ID: [name], male   DOB: [birth year], 41 y.o.   MRN: [id number] HPI: Maceo Hernan is a 41 year old male with a history of recent acute cholecystitis status post cholecystectomy on 09/05/2017 with Dr. Ninfa Linden who is seen in urgent consultation to evaluate possible bile leak.  He is here alone today.  He also has a history of diabetes.  He presented to the emergency room with acute cholecystitis and underwent cholecystectomy.  He had a stone impacted in the neck of the gallbladder with a necrotic appearing cystic stump.  A internal drain was left in place when he was discharged.  He reports that he has been having recurrent right upper quadrant abdominal pain which radiates to his right shoulder.  He is also had bilious drainage into the suction bulb.  He reports pain will build up and then slowly be relieved as the drainage bulb fills up.  He has waves of nausea but no vomiting.  He does have an appetite but quickly regrets when he eats because it causes worsening right upper quadrant pain.  No fevers or chills.  Is passing gas and bowel movement.  Rates pain right now about a 6 out of 10, though earlier this week was 10 out of 10.  He works for [employer].  No alcohol recently.  Medical history includes prior cervical fusion, lumbar laminectomy and an ASD repair at age 89.  Past Medical History:  Diagnosis Date  . Anxiety and depression   . Cholecystitis   . DIABETES MELLITUS, TYPE II 12/24/2007  . Fatty liver   . GANGLION CYST, WRIST, LEFT 07/29/2007  . Headache(784.0)   . OBESITY 12/24/2007    Past Surgical History:  Procedure Laterality Date  . ANTERIOR CERVICAL DECOMP/DISCECTOMY FUSION N/A 04/15/2013   Procedure: ANTERIOR CERVICAL DECOMPRESSION/DISCECTOMY FUSION 1 LEVEL;  Surgeon: Winfield Cunas, MD;  Location: [facility];  Service: Neurosurgery;  Laterality: N/A;  Cervical Five-Six Anterior Cervical decompression with fusion plating and bonegraft  . CHOLECYSTECTOMY N/A  09/05/2017   Procedure: LAPAROSCOPIC CHOLECYSTECTOMY;  Surgeon: Coralie Keens, MD;  Location: [facility];  Service: General;  Laterality: N/A;  . LUMBAR LAMINECTOMY    . VSD REPAIR     age 71    Outpatient Medications Prior to Visit  Medication Sig Dispense Refill  . atorvastatin (LIPITOR) 10 MG tablet Take 1 tablet (10 mg total) by mouth daily. 90 tablet 3  . INVOKANA 300 MG TABS tablet TAKE 1 TABLET BY MOUTH DAILY BEFORE BREAKFAST. 30 tablet 2  . metFORMIN (GLUCOPHAGE) 1000 MG tablet TAKE 1 TABLET BY MOUTH 2 TIMES DAILY WITH A MEAL. 180 tablet 3  . ONETOUCH VERIO test strip USE TO TEST BLOOD SUGAR ONCE DAILY 100 each 4  . oxyCODONE (OXY IR/ROXICODONE) 5 MG immediate release tablet Take 1 tablet (5 mg total) by mouth every 6 (six) hours as needed for severe pain. 20 tablet 0  . Dulaglutide (TRULICITY) 1.5 BJ/6.2GB SOPN Inject 1.5 mg into the skin once a week. (Patient not taking: Reported on 09/05/2017) 4 pen 6   No facility-administered medications prior to visit.     Allergies  Allergen Reactions  . Amoxicillin     As a child    Family History  Problem Relation Age of Onset  . Diabetes Mother     Social History   Tobacco Use  . Smoking status: Never Smoker  . Smokeless tobacco: Never Used  Substance Use Topics  . Alcohol use: Yes  Comment: occ  . Drug use: Yes    Types: Marijuana    ROS: As per history of present illness, otherwise negative  BP 128/90 (BP Location: Left Arm, Patient Position: Sitting, Cuff Size: Normal)   Pulse 96   Ht 5' 7.5" (1.715 m) Comment: height measured without shoes  Wt 231 lb 2 oz (104.8 kg)   BMI 35.66 kg/m  Constitutional: Well-developed and well-nourished slightly uncomfortable appearing. No distress. HEENT: Normocephalic and atraumatic. Oropharynx is clear and moist. Conjunctivae are normal.  No scleral icterus. Neck: Neck supple. Trachea midline. Cardiovascular: Normal rate, regular rhythm and intact distal pulses. No  M/R/G Pulmonary/chest: Effort normal and breath sounds normal. No wheezing, rales or rhonchi. Abdominal: Soft, right upper quadrant drain with bilious fluid, right upper quadrant tenderness without rebound or guarding nondistended. Bowel sounds active throughout. Extremities: no clubbing, cyanosis, or edema Neurological: Alert and oriented to person place and time. Skin: Skin is warm and dry.  Psychiatric: Normal mood and affect. Behavior is normal.  RELEVANT LABS AND IMAGING: CBC    Component Value Date/Time   WBC 15.6 (H) 09/06/2017 0507   RBC 5.33 09/06/2017 0507   HGB 15.5 09/06/2017 0507   HCT 45.2 09/06/2017 0507   PLT 255 09/06/2017 0507   MCV 84.8 09/06/2017 0507   MCH 29.1 09/06/2017 0507   MCHC 34.3 09/06/2017 0507   RDW 13.1 09/06/2017 0507   LYMPHSABS 2.3 11/19/2016 1347   MONOABS 0.7 11/19/2016 1347   EOSABS 0.2 11/19/2016 1347   BASOSABS 0.1 11/19/2016 1347    CMP     Component Value Date/Time   NA 138 09/06/2017 0507   K 4.1 09/06/2017 0507   CL 107 09/06/2017 0507   CO2 20 (L) 09/06/2017 0507   GLUCOSE 113 (H) 09/06/2017 0507   BUN 11 09/06/2017 0507   CREATININE 0.95 09/06/2017 0507   CALCIUM 8.7 (L) 09/06/2017 0507   PROT 6.0 (L) 09/06/2017 0507   ALBUMIN 3.4 (L) 09/06/2017 0507   AST 31 09/06/2017 0507   ALT 51 09/06/2017 0507   ALKPHOS 68 09/06/2017 0507   BILITOT 1.1 09/06/2017 0507   GFRNONAA >60 09/06/2017 0507   GFRAA >60 09/06/2017 0507    ASSESSMENT/PLAN: 41 year old male with a history of recent acute cholecystitis status post cholecystectomy on 09/05/2017 with Dr. Ninfa Linden who is seen in urgent consultation to evaluate possible bile leak.  1.  Post cholecystectomy bile leak --symptoms consistent with bile leak along with bile in the internal/external drain lying near the gallbladder fossa.  I discussed the case with Dr. Ninfa Linden and with Dr. Carlean Purl (biliary endoscopist on call).  Based on his intermittent significant pain, I feel it most  prudent to have the patient admitted for ERCP for stent placement over the weekend.  He will be admitted to the surgical service, most likely at [facility], with plans for ERCP for stent placement given bile leak.  Likely ERCP tomorrow.  The nature of the procedure, as well as the risks, benefits, and alternatives were carefully and thoroughly reviewed with the patient. Ample time for discussion and questions allowed. The patient understood, was satisfied, and agreed to proceed.  --Clear liquids until midnight --Continue oxycodone while waiting on a bed as directed and as needed for pain   DU:[id number], Doretha Sou, [address] [place], [place] [address]

## 2017-09-14 ENCOUNTER — Observation Stay (HOSPITAL_COMMUNITY): Payer: 59 | Admitting: Certified Registered"

## 2017-09-14 ENCOUNTER — Observation Stay (HOSPITAL_COMMUNITY): Payer: 59

## 2017-09-14 ENCOUNTER — Encounter (HOSPITAL_COMMUNITY): Payer: Self-pay | Admitting: Internal Medicine

## 2017-09-14 ENCOUNTER — Ambulatory Visit (HOSPITAL_COMMUNITY): Admission: RE | Admit: 2017-09-14 | Payer: 59 | Source: Ambulatory Visit | Admitting: Internal Medicine

## 2017-09-14 ENCOUNTER — Other Ambulatory Visit: Payer: Self-pay

## 2017-09-14 ENCOUNTER — Encounter (HOSPITAL_COMMUNITY): Admission: AD | Disposition: A | Discharge status: Home / Self Care | Payer: Self-pay | Source: Home / Self Care

## 2017-09-14 DIAGNOSIS — K833 Fistula of bile duct: Secondary | ICD-10-CM

## 2017-09-14 DIAGNOSIS — Z880 Allergy status to penicillin: Secondary | ICD-10-CM | POA: Diagnosis not present

## 2017-09-14 DIAGNOSIS — K839 Disease of biliary tract, unspecified: Secondary | ICD-10-CM | POA: Diagnosis not present

## 2017-09-14 DIAGNOSIS — R1011 Right upper quadrant pain: Secondary | ICD-10-CM | POA: Diagnosis not present

## 2017-09-14 DIAGNOSIS — K9189 Other postprocedural complications and disorders of digestive system: Principal | ICD-10-CM

## 2017-09-14 DIAGNOSIS — R1010 Upper abdominal pain, unspecified: Secondary | ICD-10-CM | POA: Diagnosis not present

## 2017-09-14 DIAGNOSIS — R000 Tachycardia, unspecified: Secondary | ICD-10-CM | POA: Diagnosis not present

## 2017-09-14 DIAGNOSIS — K838 Other specified diseases of biliary tract: Secondary | ICD-10-CM

## 2017-09-14 DIAGNOSIS — K760 Fatty (change of) liver, not elsewhere classified: Secondary | ICD-10-CM | POA: Diagnosis present

## 2017-09-14 DIAGNOSIS — R109 Unspecified abdominal pain: Secondary | ICD-10-CM | POA: Diagnosis not present

## 2017-09-14 DIAGNOSIS — E669 Obesity, unspecified: Secondary | ICD-10-CM | POA: Diagnosis present

## 2017-09-14 DIAGNOSIS — Z79899 Other long term (current) drug therapy: Secondary | ICD-10-CM | POA: Diagnosis not present

## 2017-09-14 DIAGNOSIS — R918 Other nonspecific abnormal finding of lung field: Secondary | ICD-10-CM | POA: Diagnosis not present

## 2017-09-14 DIAGNOSIS — R140 Abdominal distension (gaseous): Secondary | ICD-10-CM | POA: Diagnosis not present

## 2017-09-14 DIAGNOSIS — K8000 Calculus of gallbladder with acute cholecystitis without obstruction: Secondary | ICD-10-CM | POA: Diagnosis not present

## 2017-09-14 DIAGNOSIS — E119 Type 2 diabetes mellitus without complications: Secondary | ICD-10-CM | POA: Diagnosis present

## 2017-09-14 DIAGNOSIS — Z7984 Long term (current) use of oral hypoglycemic drugs: Secondary | ICD-10-CM | POA: Diagnosis not present

## 2017-09-14 DIAGNOSIS — K858 Other acute pancreatitis without necrosis or infection: Secondary | ICD-10-CM | POA: Diagnosis not present

## 2017-09-14 DIAGNOSIS — F419 Anxiety disorder, unspecified: Secondary | ICD-10-CM | POA: Diagnosis present

## 2017-09-14 DIAGNOSIS — K859 Acute pancreatitis without necrosis or infection, unspecified: Secondary | ICD-10-CM | POA: Diagnosis not present

## 2017-09-14 DIAGNOSIS — Z6835 Body mass index (BMI) 35.0-35.9, adult: Secondary | ICD-10-CM | POA: Diagnosis not present

## 2017-09-14 DIAGNOSIS — D72829 Elevated white blood cell count, unspecified: Secondary | ICD-10-CM | POA: Diagnosis not present

## 2017-09-14 DIAGNOSIS — Z833 Family history of diabetes mellitus: Secondary | ICD-10-CM | POA: Diagnosis not present

## 2017-09-14 DIAGNOSIS — K8581 Other acute pancreatitis with uninfected necrosis: Secondary | ICD-10-CM | POA: Diagnosis not present

## 2017-09-14 DIAGNOSIS — Y836 Removal of other organ (partial) (total) as the cause of abnormal reaction of the patient, or of later complication, without mention of misadventure at the time of the procedure: Secondary | ICD-10-CM | POA: Diagnosis present

## 2017-09-14 DIAGNOSIS — J9811 Atelectasis: Secondary | ICD-10-CM | POA: Diagnosis not present

## 2017-09-14 DIAGNOSIS — F329 Major depressive disorder, single episode, unspecified: Secondary | ICD-10-CM | POA: Diagnosis present

## 2017-09-14 DIAGNOSIS — K8020 Calculus of gallbladder without cholecystitis without obstruction: Secondary | ICD-10-CM | POA: Diagnosis not present

## 2017-09-14 DIAGNOSIS — M25511 Pain in right shoulder: Secondary | ICD-10-CM | POA: Diagnosis not present

## 2017-09-14 DIAGNOSIS — Z9049 Acquired absence of other specified parts of digestive tract: Secondary | ICD-10-CM | POA: Diagnosis not present

## 2017-09-14 HISTORY — PX: ERCP: SHX5425

## 2017-09-14 LAB — LIPASE, BLOOD: Lipase: 39 U/L (ref 11–51)

## 2017-09-14 LAB — GLUCOSE, CAPILLARY
Glucose-Capillary: 147 mg/dL — ABNORMAL HIGH (ref 65–99)
Glucose-Capillary: 176 mg/dL — ABNORMAL HIGH (ref 65–99)
Glucose-Capillary: 156 mg/dL — ABNORMAL HIGH (ref 65–99)
Glucose-Capillary: 189 mg/dL — ABNORMAL HIGH (ref 65–99)

## 2017-09-14 LAB — AMYLASE: Amylase: 39 U/L (ref 28–100)

## 2017-09-14 SURGERY — ERCP, WITH INTERVENTION IF INDICATED
Anesthesia: General

## 2017-09-14 MED ORDER — PROPOFOL 10 MG/ML IV BOLUS
INTRAVENOUS | Status: DC | PRN
  Administered 2017-09-14: 200 mg via INTRAVENOUS

## 2017-09-14 MED ORDER — HYDROMORPHONE HCL 1 MG/ML IJ SOLN
1.0000 mg | INTRAMUSCULAR | Status: DC | PRN
  Administered 2017-09-14 – 2017-09-19 (×23): 1 mg via INTRAVENOUS
  Filled 2017-09-14 (×25): qty 1

## 2017-09-14 MED ORDER — FENTANYL CITRATE (PF) 100 MCG/2ML IJ SOLN: 25.0000 ug | INTRAMUSCULAR | Status: DC | PRN

## 2017-09-14 MED ORDER — IOPAMIDOL (ISOVUE-300) INJECTION 61%
INTRAVENOUS | Status: AC
  Administered 2017-09-14: 100 mL
  Filled 2017-09-14: qty 100

## 2017-09-14 MED ORDER — LIDOCAINE 2% (20 MG/ML) 5 ML SYRINGE
INTRAMUSCULAR | Status: DC | PRN
  Administered 2017-09-14: 60 mg via INTRAVENOUS

## 2017-09-14 MED ORDER — FENTANYL CITRATE (PF) 250 MCG/5ML IJ SOLN
INTRAMUSCULAR | Status: AC
  Filled 2017-09-14: qty 5

## 2017-09-14 MED ORDER — FENTANYL CITRATE (PF) 100 MCG/2ML IJ SOLN
INTRAMUSCULAR | Status: DC | PRN
  Administered 2017-09-14 (×2): 50 ug via INTRAVENOUS

## 2017-09-14 MED ORDER — HYDROMORPHONE HCL 1 MG/ML IJ SOLN: 1.0000 mg | INTRAMUSCULAR | Status: DC | PRN

## 2017-09-14 MED ORDER — ONDANSETRON HCL 4 MG/2ML IJ SOLN
INTRAMUSCULAR | Status: DC | PRN
  Administered 2017-09-14: 4 mg via INTRAVENOUS

## 2017-09-14 MED ORDER — ROCURONIUM BROMIDE 10 MG/ML (PF) SYRINGE
PREFILLED_SYRINGE | INTRAVENOUS | Status: DC | PRN
  Administered 2017-09-14: 20 mg via INTRAVENOUS
  Administered 2017-09-14: 50 mg via INTRAVENOUS
  Administered 2017-09-14: 20 mg via INTRAVENOUS

## 2017-09-14 MED ORDER — LACTATED RINGERS IV SOLN
INTRAVENOUS | Status: DC | PRN
  Administered 2017-09-14: 10:00:00 via INTRAVENOUS

## 2017-09-14 MED ORDER — OXYCODONE HCL 5 MG PO TABS: 5.0000 mg | ORAL_TABLET | Freq: Once | ORAL | Status: DC | PRN

## 2017-09-14 MED ORDER — MIDAZOLAM HCL 2 MG/2ML IJ SOLN
INTRAMUSCULAR | Status: AC
  Filled 2017-09-14: qty 2

## 2017-09-14 MED ORDER — LACTATED RINGERS IV BOLUS (SEPSIS)
1000.0000 mL | Freq: Once | INTRAVENOUS | Status: AC
  Administered 2017-09-14: 1000 mL via INTRAVENOUS

## 2017-09-14 MED ORDER — IOPAMIDOL (ISOVUE-300) INJECTION 61%
INTRAVENOUS | Status: DC | PRN
  Administered 2017-09-14: 10 mL

## 2017-09-14 MED ORDER — CIPROFLOXACIN IN D5W 400 MG/200ML IV SOLN
400.0000 mg | INTRAVENOUS | Status: AC
  Administered 2017-09-14: 400 mg via INTRAVENOUS

## 2017-09-14 MED ORDER — INDOMETHACIN 50 MG RE SUPP
RECTAL | Status: AC
  Filled 2017-09-14: qty 2

## 2017-09-14 MED ORDER — SUGAMMADEX SODIUM 200 MG/2ML IV SOLN
INTRAVENOUS | Status: DC | PRN
  Administered 2017-09-14: 400 mg via INTRAVENOUS

## 2017-09-14 MED ORDER — GLUCAGON HCL RDNA (DIAGNOSTIC) 1 MG IJ SOLR
INTRAMUSCULAR | Status: AC
  Filled 2017-09-14: qty 1

## 2017-09-14 MED ORDER — CIPROFLOXACIN IN D5W 400 MG/200ML IV SOLN
400.0000 mg | Freq: Two times a day (BID) | INTRAVENOUS | Status: DC
  Administered 2017-09-14 – 2017-09-17 (×6): 400 mg via INTRAVENOUS
  Filled 2017-09-14 (×6): qty 200

## 2017-09-14 MED ORDER — IOPAMIDOL (ISOVUE-300) INJECTION 61%
INTRAVENOUS | Status: AC
  Filled 2017-09-14: qty 50

## 2017-09-14 MED ORDER — OXYCODONE HCL 5 MG/5ML PO SOLN: 5.0000 mg | Freq: Once | ORAL | Status: DC | PRN

## 2017-09-14 MED ORDER — CIPROFLOXACIN IN D5W 400 MG/200ML IV SOLN
INTRAVENOUS | Status: AC
  Filled 2017-09-14: qty 200

## 2017-09-14 MED ORDER — ONDANSETRON HCL 4 MG/2ML IJ SOLN: 4.0000 mg | Freq: Four times a day (QID) | INTRAMUSCULAR | Status: DC | PRN

## 2017-09-14 MED ORDER — INDOMETHACIN 50 MG RE SUPP
RECTAL | Status: DC | PRN
  Administered 2017-09-14: 100 mg via RECTAL

## 2017-09-14 MED ORDER — MIDAZOLAM HCL 2 MG/2ML IJ SOLN
INTRAMUSCULAR | Status: DC | PRN
  Administered 2017-09-14 (×2): 1 mg via INTRAVENOUS

## 2017-09-14 MED ORDER — INDOMETHACIN 50 MG RE SUPP: 100.0000 mg | RECTAL | Status: DC

## 2017-09-14 MED ORDER — PROPOFOL 10 MG/ML IV BOLUS
INTRAVENOUS | Status: AC
  Filled 2017-09-14: qty 20

## 2017-09-14 MED ORDER — METRONIDAZOLE IN NACL 5-0.79 MG/ML-% IV SOLN
500.0000 mg | Freq: Four times a day (QID) | INTRAVENOUS | Status: DC
  Administered 2017-09-14 – 2017-09-17 (×11): 500 mg via INTRAVENOUS
  Filled 2017-09-14 (×12): qty 100

## 2017-09-14 NOTE — Anesthesia Postprocedure Evaluation (Signed)
Anesthesia Post Note  Patient: Derrick West  Procedure(s) Performed: ENDOSCOPIC RETROGRADE CHOLANGIOPANCREATOGRAPHY (ERCP) (N/A )     Patient location during evaluation: PACU Anesthesia Type: General Level of consciousness: awake and alert Pain management: pain level controlled Vital Signs Assessment: post-procedure vital signs reviewed and stable Respiratory status: spontaneous breathing, nonlabored ventilation, respiratory function stable and patient connected to nasal cannula oxygen Cardiovascular status: blood pressure returned to baseline and stable Postop Assessment: no apparent nausea or vomiting Anesthetic complications: no    Last Vitals:  Vitals:   09/14/17 1135 09/14/17 1159  BP: 135/90 (!) 145/88  Pulse: 82 79  Resp: 17 18  Temp: 36.6 C 36.7 C  SpO2: 95% 94%    Last Pain:  Vitals:   09/14/17 1159  TempSrc: Oral  PainSc:                  [place]

## 2017-09-14 NOTE — Progress Notes (Signed)
   Patient Name: Derrick West Date of Encounter: 09/14/2017, 8:03 AM    Subjective  Having RUQ pain and infrascapular pain - intermittent   Objective  BP 124/65 (BP Location: Right Arm)   Pulse 70   Temp 98 F (36.7 C) (Oral)   Resp 18   Ht 5\' 8"  (1.727 m)   Wt 231 lb 4.2 oz (104.9 kg)   SpO2 97%   BMI 35.16 kg/m  Mildly ill Anicteric Lungs cta ant Cor s1s2 no rmg abd RUQ drain w/ bilious fluid, obese, soft mildly tender RUQ A and o  Labs reviewed - glucose 216, CBC NL, CMET NL o/w and INR NL    Assessment and Plan  Post op bile leak after lap chole for cholecystitis  ERCP w/ stent today  The risks and benefits as well as alternatives of endoscopic procedure(s) have been discussed and reviewed. All questions answered. The patient agrees to proceed.   Gatha Mayer, MD, [facility] Gastroenterology [phone] (pager) 09/14/2017 8:03 AM

## 2017-09-14 NOTE — Progress Notes (Signed)
   Patient Name: Derrick West Date of Encounter: 09/14/2017, 3:34 PM    Subjective  Doing post-procedure check - having intense R scapular pain and abdominal distention, feels bloated Pain similar but worse than pre-ERCP No flatus but feels like he needs to   Objective  BP (!) 145/88 (BP Location: Right Arm)   Pulse 79   Temp 98.1 F (36.7 C) (Oral)   Resp 18   Ht 5\' 8"  (1.727 m)   Wt 231 lb 4.2 oz (104.9 kg)   SpO2 94%   BMI 35.16 kg/m  In mod-severe pain abd mildly distended, quiet and somewhat tender diffusely    Assessment and Plan  Bile leak Increasing abdominal pain referred to shoulder S/p ERCP and bile duct stent this AM  ? If he has an unrealized fludi collection despite drain vs pancreatitis  I am going to check lipase and amylase - may be elevated but if very high would go along with pancreatitis after ERCP Also going to get a CT abd/pelvis   Gatha Mayer, MD, [facility] [phone] (pager) 09/14/2017 3:34 PM

## 2017-09-14 NOTE — Progress Notes (Addendum)
    CT shows more than expected gas/air s/p lap cole, bile and fluid in right gutter and GB fossa, and CT changes of pancreatitis D/w Dr. Mickey Farber  No fever VSS  I called patient and he said Dilaudid working much better for pain  He says he was having similar pain post-op and pre-ERCP that he thought was typical post op pain  I think he is having pain from bile leak, air from instrumentation today (looks like wire passed through cystic duct leak 1x so air from [place] could go out and possible stent could lead to gas leak also) also perhaps drain tract also leads to this  Since no fever and stable doubt a sig perforation from procedure  NL amylase and lipase indicate no pancreatitis  Hopefully stent + drain will help  I am going to cover with cipro and flagyl and pull back diet for now  Reassess in AM  Gatha Mayer, MD, [facility] Gastroenterology [phone] (pager) 09/14/2017 9:15 PM   Amylase is now elevated so he doed have pancreatitis from the ERCP I think.  I have changed and increased IVF which can help tx. Will see this AM  Gatha Mayer, MD, [facility] Gastroenterology [phone] (pager) 09/15/2017 7:05 AM

## 2017-09-14 NOTE — Progress Notes (Signed)
[facility]/Trauma Progress Note  Day of Surgery   Assessment/Plan  DM - SSI  Bile Leak S/P Lap chole with drain placement, 12/13, Dr. Ninfa Linden - admitted for bile leak - going for ERCP today  FEN: NPO, clears after ERCP VTE: SCD's ID: none currently Follow up: Dr. Ninfa Linden  DISPO: ERCP today, labs tomorrow morning and possible discharge with drain tomorrow    LOS: 1 day    Subjective:  CC: abdominal pain  Pt states he had a rough night and did not sleep well due to abdominal pain. He denies nausea, vomiting, fever or chills. Still having flatus.   Objective: Vital signs in last 24 hours: Temp:  [98 F (36.7 C)-99.4 F (37.4 C)] 98 F (36.7 C) (12/22 0532) Pulse Rate:  [70-96] 70 (12/22 0532) Resp:  [18] 18 (12/22 0532) BP: (124-144)/(65-90) 124/65 (12/22 0532) SpO2:  [95 %-100 %] 97 % (12/22 0532) Weight:  [231 lb 2 oz (104.8 kg)-231 lb 4.2 oz (104.9 kg)] 231 lb 4.2 oz (104.9 kg) (12/21 1810) Last BM Date: 09/12/17  Intake/Output from previous day: 12/21 0701 - 12/22 0700 In: -  Out: 150 [Drains:150] Intake/Output this shift: No intake/output data recorded.  PE: Gen:  Alert, NAD, pleasant, cooperative Card:  RRR, no M/G/R heard Pulm:  CTA, no W/R/R, effort normal Abd: Soft, obese, not distended, hypoactive BS, incisions with glue intact appear well healing, drain with dark bilious drainage, moderate generalized TTP Skin: no rashes noted, warm and dry   Anti-infectives: Anti-infectives (From admission, onward)   None      Lab Results:  Recent Labs    09/13/17 1822  WBC 9.0  HGB 15.5  HCT 44.5  PLT 258   BMET Recent Labs    09/13/17 1822  NA 137  K 3.8  CL 105  CO2 26  GLUCOSE 216*  BUN 12  CREATININE 0.99  CALCIUM 8.9   PT/INR Recent Labs    09/13/17 1822  LABPROT 12.8  INR 0.97   CMP     Component Value Date/Time   NA 137 09/13/2017 1822   K 3.8 09/13/2017 1822   CL 105 09/13/2017 1822   CO2 26  09/13/2017 1822   GLUCOSE 216 (H) 09/13/2017 1822   BUN 12 09/13/2017 1822   CREATININE 0.99 09/13/2017 1822   CALCIUM 8.9 09/13/2017 1822   PROT 6.3 (L) 09/13/2017 1822   ALBUMIN 3.5 09/13/2017 1822   AST 20 09/13/2017 1822   ALT 24 09/13/2017 1822   ALKPHOS 74 09/13/2017 1822   BILITOT 0.7 09/13/2017 1822   GFRNONAA >60 09/13/2017 1822   GFRAA >60 09/13/2017 1822   Lipase     Component Value Date/Time   LIPASE 39 09/05/2017 0343    Studies/Results: No results found.    Kalman Drape , [facility] Surgery 09/14/2017, 8:16 AM Pager: [phone] Consults: [phone] Mon-Fri 7:00 am-4:30 pm Sat-Sun 7:00 am-11:30 am

## 2017-09-14 NOTE — Transfer of Care (Addendum)
Immediate Anesthesia Transfer of Care Note  Patient: Derrick West  Procedure(s) Performed: ENDOSCOPIC RETROGRADE CHOLANGIOPANCREATOGRAPHY (ERCP) (N/A )  Patient Location: PACU  Anesthesia Type:General  Level of Consciousness: awake, alert  and oriented  Airway & Oxygen Therapy: Patient Spontanous Breathing and Patient connected to nasal cannula oxygen  Post-op Assessment: Report given to RN  Post vital signs: Reviewed and stable  Last Vitals:  Vitals:   09/14/17 0532 09/14/17 0901  BP: 124/65 (!) 155/91  Pulse: 70 81  Resp: 18 15  Temp: 36.7 C 36.6 C  SpO2: 97% 97%    Last Pain:  Vitals:   09/14/17 0901  TempSrc: Oral  PainSc: 2       Patients Stated Pain Goal: 2 (69/24/93 2419)  Complications: No apparent anesthesia complications

## 2017-09-14 NOTE — Anesthesia Preprocedure Evaluation (Addendum)
Anesthesia Evaluation  Patient identified by MRN, date of birth, ID band Patient awake    Reviewed: Allergy & Precautions, H&P , NPO status , Patient's Chart, lab work & pertinent test results  Airway Mallampati: II   Neck ROM: full    Dental  (+) Teeth Intact, Dental Advisory Given, Chipped,    Pulmonary neg pulmonary ROS,    breath sounds clear to auscultation       Cardiovascular negative cardio ROS   Rhythm:regular Rate:Normal     Neuro/Psych  Headaches, PSYCHIATRIC DISORDERS Anxiety    GI/Hepatic S/p cholecystectomy, now with bile duct leak   Endo/Other  diabetes, Type 2obese  Renal/GU      Musculoskeletal   Abdominal   Peds  Hematology   Anesthesia Other Findings   Reproductive/Obstetrics                            Anesthesia Physical Anesthesia Plan  ASA: II  Anesthesia Plan: General   Post-op Pain Management:    Induction: Intravenous  PONV Risk Score and Plan: 2 and Ondansetron, Midazolam and Treatment may vary due to age or medical condition  Airway Management Planned: Oral ETT  Additional Equipment:   Intra-op Plan:   Post-operative Plan: Extubation in OR  Informed Consent: I have reviewed the patients History and Physical, chart, labs and discussed the procedure including the risks, benefits and alternatives for the proposed anesthesia with the patient or authorized representative who has indicated his/her understanding and acceptance.     Plan Discussed with: CRNA, Anesthesiologist and Surgeon  Anesthesia Plan Comments:         Anesthesia Quick Evaluation

## 2017-09-14 NOTE — Anesthesia Procedure Notes (Signed)
Procedure Name: Intubation Date/Time: 09/14/2017 10:00 AM Performed by: Barrington Ellison, CRNA Pre-anesthesia Checklist: Patient identified, Emergency Drugs available, Suction available and Patient being monitored Patient Re-evaluated:Patient Re-evaluated prior to induction Oxygen Delivery Method: Circle System Utilized Preoxygenation: Pre-oxygenation with 100% oxygen Induction Type: IV induction Ventilation: Mask ventilation without difficulty Laryngoscope Size: Mac and 4 Grade View: Grade I Tube type: Oral Tube size: 7.5 mm Number of attempts: 1 Airway Equipment and Method: Stylet and Oral airway Placement Confirmation: ETT inserted through vocal cords under direct vision,  positive ETCO2 and breath sounds checked- equal and bilateral Secured at: 22 cm Tube secured with: Tape Dental Injury: Teeth and Oropharynx as per pre-operative assessment

## 2017-09-14 NOTE — Op Note (Signed)
[facility] Patient Name: Derrick West Procedure Date : 09/14/2017 MRN: [id number] Attending MD: Gatha Mayer , MD Date of Birth: May 22, 1976 CSN: [id number] Age: 41 Admit Type: Inpatient Procedure:                ERCP Indications:              Bile leak Providers:                Gatha Mayer, MD, Elna Breslow, RN, William Dalton, Technician Referring MD:              Medicines:                Propofol per Anesthesia, Monitored Anesthesia Care,                            Cipro 709 mg IV Complications:            No immediate complications. Estimated Blood Loss:     Estimated blood loss: none. Procedure:                Pre-Anesthesia Assessment:                           - Prior to the procedure, a History and Physical                            was performed, and patient medications and                            allergies were reviewed. The patient's tolerance of                            previous anesthesia was also reviewed. The risks                            and benefits of the procedure and the sedation                            options and risks were discussed with the patient.                            All questions were answered, and informed consent                            was obtained. Prior Anticoagulants: The patient                            last took Lovenox (enoxaparin) 1 day prior to the                            procedure. ASA Grade Assessment: II - A patient  with mild systemic disease. After reviewing the                            risks and benefits, the patient was deemed in                            satisfactory condition to undergo the procedure.                           After obtaining informed consent, the scope was                            passed under direct vision. Throughout the                            procedure, the patient's blood pressure, pulse, and                oxygen saturations were monitored continuously. The                            [id number] ([id number]) Pentax duodenoscope was                            introduced through the mouth, and used to inject                            contrast into and used to inject contrast into the                            bile duct. The ERCP was somewhat difficult due to                            significant looping. Successful completion of the                            procedure was aided by changing the patient's                            position. The patient tolerated the procedure well. Scope In: Scope Out: Findings:      A scout film of the abdomen was obtained. Surgical clips, consistent       with a previous cholecystectomy, were seen in the area of the right       upper quadrant of the abdomen. Drain in gallbladder fossa also      Had to move patient semi-left lateral to enter and remain in duodenum.       Normal papilla - draining bile. Cannulated w/ wire 1x into pancreas then       into bile ducts. Contrast injection showed leak at cystic duct stump.       Remainder of biliary tree normal. A 5 cm 10 Fr plastric stent was       placed. Procedure terminated. Indomethacin 100 mg per rectum       administered.      Extravasation of contrast originating from the cystic duct was observed.  The biliary tree was otherwise normal. Impression:               - A bile leak was found. Recommendation:           - Return patient to hospital ward for ongoing care.                           - Clears today could go to carb modified at supper                            if doing ok                           Watch drain output and sxs - when acceptable dc                            home I will see him tomorrow also                           will need repeat ERCP and stent removal 2-3 months                            as outpatient - I will arrange pending clinical                             course Procedure Code(s):        --- Professional ---                           [phone], Endoscopic retrograde                            cholangiopancreatography (ERCP); with placement of                            endoscopic stent into biliary or pancreatic duct,                            including pre- and post-dilation and guide wire                            passage, when performed, including sphincterotomy,                            when performed, each stent Diagnosis Code(s):        --- Professional ---                           K83.9, Disease of biliary tract, unspecified                           K83.8, Other specified diseases of biliary tract CPT copyright 2016 [employer]. All rights reserved. The codes documented in this report are preliminary and upon coder review may  be revised to meet current compliance requirements. Gatha Mayer, MD 09/14/2017 11:14:11 AM This  report has been signed electronically. Number of Addenda: 0

## 2017-09-15 ENCOUNTER — Encounter (HOSPITAL_COMMUNITY): Payer: Self-pay | Admitting: Internal Medicine

## 2017-09-15 DIAGNOSIS — K859 Acute pancreatitis without necrosis or infection, unspecified: Secondary | ICD-10-CM

## 2017-09-15 LAB — CBC
HCT: 48.4 % (ref 39.0–52.0)
HCT: 47.7 % (ref 39.0–52.0)
Hemoglobin: 17 g/dL (ref 13.0–17.0)
Hemoglobin: 17 g/dL (ref 13.0–17.0)
MCH: 29.3 pg (ref 26.0–34.0)
MCH: 29.8 pg (ref 26.0–34.0)
MCHC: 35.1 g/dL (ref 30.0–36.0)
MCHC: 35.6 g/dL (ref 30.0–36.0)
MCV: 83.3 fL (ref 78.0–100.0)
MCV: 83.5 fL (ref 78.0–100.0)
Platelets: 279 10*3/uL (ref 150–400)
Platelets: 295 10*3/uL (ref 150–400)
RBC: 5.81 MIL/uL (ref 4.22–5.81)
RBC: 5.71 MIL/uL (ref 4.22–5.81)
RDW: 12.8 % (ref 11.5–15.5)
RDW: 12.9 % (ref 11.5–15.5)
WBC: 17.9 10*3/uL — ABNORMAL HIGH (ref 4.0–10.5)
WBC: 18.2 10*3/uL — ABNORMAL HIGH (ref 4.0–10.5)

## 2017-09-15 LAB — COMPREHENSIVE METABOLIC PANEL
ALT: 21 U/L (ref 17–63)
AST: 14 U/L — ABNORMAL LOW (ref 15–41)
Albumin: 3 g/dL — ABNORMAL LOW (ref 3.5–5.0)
Alkaline Phosphatase: 70 U/L (ref 38–126)
Anion gap: 11 (ref 5–15)
BUN: 8 mg/dL (ref 6–20)
CO2: 21 mmol/L — ABNORMAL LOW (ref 22–32)
Calcium: 8.8 mg/dL — ABNORMAL LOW (ref 8.9–10.3)
Chloride: 103 mmol/L (ref 101–111)
Creatinine, Ser: 0.87 mg/dL (ref 0.61–1.24)
GFR calc Af Amer: >60 mL/min (ref >60)
GFR calc non Af Amer: >60 mL/min (ref >60)
Glucose, Bld: 210 mg/dL — ABNORMAL HIGH (ref 65–99)
Potassium: 4.6 mmol/L (ref 3.5–5.1)
Sodium: 135 mmol/L (ref 135–145)
Total Bilirubin: 1.3 mg/dL — ABNORMAL HIGH (ref 0.3–1.2)
Total Protein: 7 g/dL (ref 6.5–8.1)

## 2017-09-15 LAB — AMYLASE: Amylase: 1228 U/L — ABNORMAL HIGH (ref 28–100)

## 2017-09-15 LAB — GLUCOSE, CAPILLARY
Glucose-Capillary: 187 mg/dL — ABNORMAL HIGH (ref 65–99)
Glucose-Capillary: 200 mg/dL — ABNORMAL HIGH (ref 65–99)
Glucose-Capillary: 182 mg/dL — ABNORMAL HIGH (ref 65–99)
Glucose-Capillary: 164 mg/dL — ABNORMAL HIGH (ref 65–99)

## 2017-09-15 LAB — LIPASE, BLOOD: Lipase: 1367 U/L — ABNORMAL HIGH (ref 11–51)

## 2017-09-15 MED ORDER — KCL-LACTATED RINGERS-D5W 20 MEQ/L IV SOLN
INTRAVENOUS | Status: AC
  Administered 2017-09-15: 11:00:00 via INTRAVENOUS
  Filled 2017-09-15 (×2): qty 1000

## 2017-09-15 MED ORDER — KCL-LACTATED RINGERS-D5W 20 MEQ/L IV SOLN
INTRAVENOUS | Status: DC
  Administered 2017-09-15 – 2017-09-23 (×22): via INTRAVENOUS
  Filled 2017-09-15 (×28): qty 1000

## 2017-09-15 MED ORDER — PROMETHAZINE HCL 25 MG/ML IJ SOLN
12.5000 mg | Freq: Four times a day (QID) | INTRAMUSCULAR | Status: DC | PRN
  Administered 2017-09-15: 25 mg via INTRAVENOUS
  Administered 2017-09-16: 12.5 mg via INTRAVENOUS
  Filled 2017-09-15 (×3): qty 1

## 2017-09-15 NOTE — Progress Notes (Addendum)
   Patient Name: Derrick West Date of Encounter: 09/15/2017, 9:12 AM    Subjective  Still in pain , + nausea, says at about 0300 things began to improve vs intense pain yesterday 40 cc out drain past 12 hrs + flatus  Objective  BP (!) 147/87 (BP Location: Right Arm)   Pulse (!) 107   Temp 97.6 F (36.4 C) (Other (Comment))   Resp 18   Ht 5\' 8"  (1.727 m)   Wt 231 lb 4.2 oz (104.9 kg)   SpO2 94%   BMI 35.16 kg/m  Lungs cta ant Cor s1s2 no rmg abd obese, soft (vs yesterday) tender RUQ/LUQ - pale bilious fluid in RUQ drain, quiet BS Alert and oriented   Lab Results  Component Value Date   AMYLASE 1,228 (H) 09/15/2017   Lab Results  Component Value Date   LIPASE 1,367 (H) 09/15/2017   Lab Results  Component Value Date   WBC 17.9 (H) 09/15/2017   HGB 17.0 09/15/2017   HCT 48.4 09/15/2017   MCV 83.3 09/15/2017   PLT 279 09/15/2017   Lab Results  Component Value Date   CREATININE 0.87 09/15/2017   BUN 8 09/15/2017   NA 135 09/15/2017   K 4.6 09/15/2017   CL 103 09/15/2017   CO2 21 (L) 09/15/2017   Lab Results  Component Value Date   ALT 21 09/15/2017   AST 14 (L) 09/15/2017   ALKPHOS 70 09/15/2017   BILITOT 1.3 (H) 09/15/2017       Assessment and Plan  1) Post-op bile leak, treated with biliary stent - may be reducing drainage but not sure yet 2) Post-ercp pancreatitis 3) greater than expected intraperitoneal air on CT w/o signs of perforated viscous   I think the pancreatitis explains most of yesterday after ERCP but think bile under diaphragm is causing some sxs also He looks better but not yet well for sure  Continue aggressive IV hydration (added D5 as he is not taking much po in) Sips clears/chips  I think the intraperitoneal air is multifactorial from drain and pneumobilia + cystic duct leak and nota sign of a perforated viscous - am covering all his problems w/ Abx for now but do not think we need to commit to prolonged course - would dc as  he improves  Added promethazine for refractory nausea  We will f/u again tomorrow  Gatha Mayer, MD, [facility] [phone] (pager) 09/15/2017 9:12 AM

## 2017-09-15 NOTE — Progress Notes (Signed)
[facility]/Trauma Progress Note  1 Day Post-Op   Assessment/Plan  DM - SSI  Bile Leak S/P Lap chole with drain placement, 12/13, Dr. Ninfa Linden - admitted for bile leak - S/P ERCP and stent placement, Dr. Carlean Purl, 12/22  Leak at cystic duct - CT after ERCP showed more than expected gas/air s/p lap cole, bile and fluid in right gutter and GB fossa, and CT changes of pancreatitis. Air likely from ERCP - Lipase this AM 1367  Pancreatitis - s/p ERCP, recheck labs in am  Leukocytosis - likely reactionary, will monitor, am labs  FEN: clears VTE: SCD's ID: none currently Follow up: Dr. Ninfa Linden  DISPO: Will await recommendations from GI. Likely recheck labs in am and if pancreatitis improving may be able to go home tomorrow with drain.    LOS: 2 days    Subjective: CC: abdominal pain and nausea  Pt states worse pain was yesterday after ERCP. Pain improved since around 0300 today but pt is still in significant pain. Having nausea. No vomiting. Still having flatus. Drain output 100cc.  Objective: Vital signs in last 24 hours: Temp:  [97.6 F (36.4 C)-98.4 F (36.9 C)] 97.6 F (36.4 C) (12/23 0616) Pulse Rate:  [79-108] 107 (12/23 0616) Resp:  [15-20] 18 (12/23 0616) BP: (130-155)/(82-92) 147/87 (12/23 0616) SpO2:  [94 %-99 %] 94 % (12/23 0616) Last BM Date: 09/13/17  Intake/Output from previous day: 12/22 0701 - 12/23 0700 In: 4041.3 [P.O.:400; I.V.:2471.3; IV Piggyback:400] Out: 400 [Urine:300; Drains:100] Intake/Output this shift: Total I/O In: -  Out: 22 [Drains:40]  PE: Gen:  Alert, NAD, pleasant, cooperative Card:  RRR, no M/G/R heard Pulm:  CTA, no W/R/R, effort normal Abd: Soft, obese, not distended, hypoactive BS, incisions with glue intact appear well healing, drain with dark bilious drainage, moderate generalized TTP worse in RUQ Skin: no rashes noted, warm and dry   Anti-infectives: Anti-infectives (From admission, onward)   Start      Dose/Rate Route Frequency Ordered Stop   09/14/17 2100  ciprofloxacin (CIPRO) IVPB 400 mg     400 mg 200 mL/hr over 60 Minutes Intravenous Every 12 hours 09/14/17 2047     09/14/17 2100  metroNIDAZOLE (FLAGYL) IVPB 500 mg     500 mg 100 mL/hr over 60 Minutes Intravenous Every 6 hours 09/14/17 2047     09/14/17 0830  ciprofloxacin (CIPRO) IVPB 400 mg     400 mg 200 mL/hr over 60 Minutes Intravenous To Short Stay 09/14/17 0824 09/14/17 0952      Lab Results:  Recent Labs    09/13/17 1822 09/15/17 0514  WBC 9.0 17.9*  HGB 15.5 17.0  HCT 44.5 48.4  PLT 258 279   BMET Recent Labs    09/13/17 1822 09/15/17 0514  NA 137 135  K 3.8 4.6  CL 105 103  CO2 26 21*  GLUCOSE 216* 210*  BUN 12 8  CREATININE 0.99 0.87  CALCIUM 8.9 8.8*   PT/INR Recent Labs    09/13/17 1822  LABPROT 12.8  INR 0.97   CMP     Component Value Date/Time   NA 135 09/15/2017 0514   K 4.6 09/15/2017 0514   CL 103 09/15/2017 0514   CO2 21 (L) 09/15/2017 0514   GLUCOSE 210 (H) 09/15/2017 0514   BUN 8 09/15/2017 0514   CREATININE 0.87 09/15/2017 0514   CALCIUM 8.8 (L) 09/15/2017 0514   PROT 7.0 09/15/2017 0514   ALBUMIN 3.0 (L) 09/15/2017 0514   AST 14 (  L) 09/15/2017 0514   ALT 21 09/15/2017 0514   ALKPHOS 70 09/15/2017 0514   BILITOT 1.3 (H) 09/15/2017 0514   GFRNONAA >60 09/15/2017 0514   GFRAA >60 09/15/2017 0514   Lipase     Component Value Date/Time   LIPASE 1,367 (H) 09/15/2017 0514    Studies/Results: Ct Abdomen Pelvis W Contrast  Addendum Date: 09/14/2017   ADDENDUM REPORT: 09/14/2017 21:20 ADDENDUM: The original report was by Dr. Van Clines. The following addendum is by Dr. Van Clines: Critical Value/emergent results were called by telephone at the time of interpretation on 09/14/2017 at 9:19 pm to Dr. Silvano Rusk , who verbally acknowledged these results. We discussed possible causes for the greater than expected free intraperitoneal gas, including conceivably  leak from the leaking cystic duct remnant. Electronically Signed   By: Van Clines M.D.   On: 09/14/2017 21:20   Result Date: 09/14/2017 CLINICAL DATA:  Post cholecystectomy bile leak, continued right upper quadrant abdominal pain and fever. Postop day 9. EXAM: CT ABDOMEN AND PELVIS WITH CONTRAST TECHNIQUE: Multidetector CT imaging of the abdomen and pelvis was performed using the standard protocol following bolus administration of intravenous contrast. CONTRAST:  110mL ISOVUE-300 IOPAMIDOL (ISOVUE-300) INJECTION 61% COMPARISON:  Multiple exams, including 09/05/2017 FINDINGS: Lower chest: Considerable atelectasis in both lower lobes and the lingula, new compared to 11/06/2016. Faint pericardial calcification along the cardiac apex. Hepatobiliary: No focal hepatic parenchymal lesion is identified. Small amount of gas in the common hepatic duct. Biliary stent noted with slight extension into the common hepatic duct but mostly in the common bile duct and extending to the duodenum. Clips along the gallbladder fossa near the cystic duct remnant. There is a small amount of extraluminal gas adjacent to these clips, and a percutaneous drainage catheter also tracks seen medially in this vicinity. Pancreas: Pancreatic and peripancreatic edema reason the possibility of acute pancreatitis. No overt acute fluid collection or pseudocyst. No abscess or necrosis. No dorsal pancreatic duct dilatation. Spleen: Scattered punctate calcifications compatible with old granulomatous disease. Adrenals/Urinary Tract: Unremarkable Stomach/Bowel: Unremarkable Vascular/Lymphatic: A porta hepatis lymph node measures 1.2 cm in short axis on image 32/3 and previously measured 1.0 cm. A separate adjacent lymph node is slightly enlarged at 1.0 cm on image 36/3. These nodes are likely reactive. Reproductive: Curvilinear calcifications centrally in the prostate gland. Other: Perihepatic ascites tracking in the right paracolic gutter. There is  free intraperitoneal gas scattered adjacent to the liver but also free in the anterior upper abdomen for example on image 49/ 3. This amount of gas is not typical for postoperative day 9 status post cholecystectomy, but there is a residual drain in place in the right abdomen. I do not see a compelling location for perforated viscus. Musculoskeletal: Unremarkable IMPRESSION: 1. Perihepatic ascites may be due to the patient's reported bile leak. There is a stent extending from the common hepatic duct into the duodenum, and no current biliary dilatation. 2. There is a greater amount of free intraperitoneal gas than I would expect for postop day 9 status post laparoscopic cholecystectomy. However, there is an abdominal drain in place, and drains can sometimes prolong pneumoperitoneum. I do not see discrete abscesses although some of the gas is locular. I do not see any dumping of contrast medium from the bowel to further suggest a perforated viscus. Strictly speaking, I cannot exclude gas extending from the duodenum through the stented common bile duct to leak from the cystic duct remnant. Correlate with patient's overall clinical progress. 3.  Peripancreatic edema suspicious for acute pancreatitis. 4. Reactive porta hepatis lymph nodes. 5. Atelectasis in both lower lobes. Radiology assistant personnel have been notified to put me in telephone contact with the referring physician or the referring physician's clinical representative in order to discuss these findings. Once this communication is established I will issue an addendum to this report for documentation purposes. Electronically Signed: By: Van Clines M.D. On: 09/14/2017 20:28   Dg Ercp Biliary & Pancreatic Ducts  Result Date: 09/14/2017 CLINICAL DATA:  Postoperative bile leak after cholecystectomy. EXAM: ERCP TECHNIQUE: Multiple spot images obtained with the fluoroscopic device and submitted for interpretation post-procedure. COMPARISON:  None.  FINDINGS: Imaging with a C-arm during ERCP demonstrates cannulation of a normal caliber common bile duct with contrast injection demonstrating extravasation of contrast near the level of cystic duct clips and an indwelling surgical drain. A common bile duct endoscopic stent was placed. IMPRESSION: Cholangiogram demonstrates bile leak near the level of cystic duct clips. An endoscopic biliary stent was placed in the common bile duct. These images were submitted for radiologic interpretation only. Please see the procedural report for the amount of contrast and the fluoroscopy time utilized. Electronically Signed   By: Aletta Edouard M.D.   On: 09/14/2017 10:58      Kalman Drape , [facility] 09/15/2017, 8:43 AM Pager: [phone] Consults: [phone] Mon-Fri 7:00 am-4:30 pm Sat-Sun 7:00 am-11:30 am  Agree with above. Discussed with Dr. Carlean Purl.  Alphonsa Overall, MD, [facility] Pager: [phone] Office phone:  [phone]

## 2017-09-16 ENCOUNTER — Inpatient Hospital Stay (HOSPITAL_COMMUNITY): Payer: 59

## 2017-09-16 DIAGNOSIS — D72829 Elevated white blood cell count, unspecified: Secondary | ICD-10-CM

## 2017-09-16 DIAGNOSIS — R1010 Upper abdominal pain, unspecified: Secondary | ICD-10-CM

## 2017-09-16 DIAGNOSIS — K858 Other acute pancreatitis without necrosis or infection: Secondary | ICD-10-CM

## 2017-09-16 LAB — URINALYSIS, ROUTINE W REFLEX MICROSCOPIC
Bacteria, UA: NONE SEEN
Bilirubin Urine: NEGATIVE
Glucose, UA: >=500 mg/dL — AB
Hgb urine dipstick: NEGATIVE
Ketones, ur: 80 mg/dL — AB
Nitrite: NEGATIVE
Protein, ur: 30 mg/dL — AB
Specific Gravity, Urine: 1.035 — ABNORMAL HIGH (ref 1.005–1.030)
pH: 5 (ref 5.0–8.0)

## 2017-09-16 LAB — COMPREHENSIVE METABOLIC PANEL
ALT: 13 U/L — ABNORMAL LOW (ref 17–63)
AST: 10 U/L — ABNORMAL LOW (ref 15–41)
Albumin: 2.5 g/dL — ABNORMAL LOW (ref 3.5–5.0)
Alkaline Phosphatase: 64 U/L (ref 38–126)
Anion gap: 8 (ref 5–15)
BUN: 7 mg/dL (ref 6–20)
CO2: 23 mmol/L (ref 22–32)
Calcium: 8.7 mg/dL — ABNORMAL LOW (ref 8.9–10.3)
Chloride: 105 mmol/L (ref 101–111)
Creatinine, Ser: 0.75 mg/dL (ref 0.61–1.24)
GFR calc Af Amer: >60 mL/min (ref >60)
GFR calc non Af Amer: >60 mL/min (ref >60)
Glucose, Bld: 155 mg/dL — ABNORMAL HIGH (ref 65–99)
Potassium: 4.2 mmol/L (ref 3.5–5.1)
Sodium: 136 mmol/L (ref 135–145)
Total Bilirubin: 1.1 mg/dL (ref 0.3–1.2)
Total Protein: 5.5 g/dL — ABNORMAL LOW (ref 6.5–8.1)

## 2017-09-16 LAB — GLUCOSE, CAPILLARY
Glucose-Capillary: 169 mg/dL — ABNORMAL HIGH (ref 65–99)
Glucose-Capillary: 168 mg/dL — ABNORMAL HIGH (ref 65–99)
Glucose-Capillary: 138 mg/dL — ABNORMAL HIGH (ref 65–99)
Glucose-Capillary: 140 mg/dL — ABNORMAL HIGH (ref 65–99)

## 2017-09-16 LAB — CBC
HCT: 45.4 % (ref 39.0–52.0)
Hemoglobin: 15.9 g/dL (ref 13.0–17.0)
MCH: 29.8 pg (ref 26.0–34.0)
MCHC: 35 g/dL (ref 30.0–36.0)
MCV: 85 fL (ref 78.0–100.0)
Platelets: 263 10*3/uL (ref 150–400)
RBC: 5.34 MIL/uL (ref 4.22–5.81)
RDW: 13.3 % (ref 11.5–15.5)
WBC: 23.2 10*3/uL — ABNORMAL HIGH (ref 4.0–10.5)

## 2017-09-16 LAB — LIPASE, BLOOD: Lipase: 497 U/L — ABNORMAL HIGH (ref 11–51)

## 2017-09-16 MED ORDER — DOCUSATE SODIUM 100 MG PO CAPS
100.0000 mg | ORAL_CAPSULE | Freq: Two times a day (BID) | ORAL | Status: DC
  Administered 2017-09-16 – 2017-09-22 (×12): 100 mg via ORAL
  Filled 2017-09-16 (×14): qty 1

## 2017-09-16 MED ORDER — POLYETHYLENE GLYCOL 3350 17 G PO PACK: 17.0000 g | PACK | Freq: Every day | ORAL | Status: DC | PRN

## 2017-09-16 NOTE — Progress Notes (Signed)
[facility] Progress Note  2 Days Post-Op  Subjective: CC: abdominal discomfort and nausea Patient reports that he does not have much abdominal pain but feels like when he gets up and moves that everything "just feels heavy". Nausea, no vomiting. Passing flatus. Tolerating clears.  Afebrile, tachycardic.   Objective: Vital signs in last 24 hours: Temp:  [98 F (36.7 C)-98.7 F (37.1 C)] 98 F (36.7 C) (12/24 0526) Pulse Rate:  [116-132] 131 (12/24 0526) Resp:  [18-20] 20 (12/24 0526) BP: (133-149)/(77-90) 133/90 (12/24 0526) SpO2:  [91 %-98 %] 98 % (12/24 0526) Last BM Date: 09/13/17  Intake/Output from previous day: 12/23 0701 - 12/24 0700 In: 1272.5 [P.O.:240; I.V.:432.5; IV Piggyback:600] Out: 80 [Drains:80] Intake/Output this shift: No intake/output data recorded.  PE: Gen:  Alert, NAD, pleasant Card:  Regular rhythm, tachycardic, pedal pulses 2+ BL Pulm:  Normal effort, clear to auscultation bilaterally Abd: Soft, mildly TTP in RLQ, non-distended, bowel sounds present, no HSM, incisions C/D/I, drain with small amount bilious drainage Skin: warm and dry, no rashes  Psych: A&Ox3   Lab Results:  Recent Labs    09/15/17 0514 09/15/17 1052  WBC 17.9* 18.2*  HGB 17.0 17.0  HCT 48.4 47.7  PLT 279 295   BMET Recent Labs    09/13/17 1822 09/15/17 0514  NA 137 135  K 3.8 4.6  CL 105 103  CO2 26 21*  GLUCOSE 216* 210*  BUN 12 8  CREATININE 0.99 0.87  CALCIUM 8.9 8.8*   PT/INR Recent Labs    09/13/17 1822  LABPROT 12.8  INR 0.97   CMP     Component Value Date/Time   NA 135 09/15/2017 0514   K 4.6 09/15/2017 0514   CL 103 09/15/2017 0514   CO2 21 (L) 09/15/2017 0514   GLUCOSE 210 (H) 09/15/2017 0514   BUN 8 09/15/2017 0514   CREATININE 0.87 09/15/2017 0514   CALCIUM 8.8 (L) 09/15/2017 0514   PROT 7.0 09/15/2017 0514   ALBUMIN 3.0 (L) 09/15/2017 0514   AST 14 (L) 09/15/2017 0514   ALT 21 09/15/2017 0514   ALKPHOS 70 09/15/2017 0514    BILITOT 1.3 (H) 09/15/2017 0514   GFRNONAA >60 09/15/2017 0514   GFRAA >60 09/15/2017 0514   Lipase     Component Value Date/Time   LIPASE 1,367 (H) 09/15/2017 0514       Studies/Results: Ct Abdomen Pelvis W Contrast  Addendum Date: 09/14/2017   ADDENDUM REPORT: 09/14/2017 21:20 ADDENDUM: The original report was by Dr. Van Clines. The following addendum is by Dr. Van Clines: Critical Value/emergent results were called by telephone at the time of interpretation on 09/14/2017 at 9:19 pm to Dr. Silvano Rusk , who verbally acknowledged these results. We discussed possible causes for the greater than expected free intraperitoneal gas, including conceivably leak from the leaking cystic duct remnant. Electronically Signed   By: Van Clines M.D.   On: 09/14/2017 21:20   Result Date: 09/14/2017 CLINICAL DATA:  Post cholecystectomy bile leak, continued right upper quadrant abdominal pain and fever. Postop day 9. EXAM: CT ABDOMEN AND PELVIS WITH CONTRAST TECHNIQUE: Multidetector CT imaging of the abdomen and pelvis was performed using the standard protocol following bolus administration of intravenous contrast. CONTRAST:  185mL ISOVUE-300 IOPAMIDOL (ISOVUE-300) INJECTION 61% COMPARISON:  Multiple exams, including 09/05/2017 FINDINGS: Lower chest: Considerable atelectasis in both lower lobes and the lingula, new compared to 11/06/2016. Faint pericardial calcification along the cardiac apex. Hepatobiliary: No focal hepatic parenchymal lesion is  identified. Small amount of gas in the common hepatic duct. Biliary stent noted with slight extension into the common hepatic duct but mostly in the common bile duct and extending to the duodenum. Clips along the gallbladder fossa near the cystic duct remnant. There is a small amount of extraluminal gas adjacent to these clips, and a percutaneous drainage catheter also tracks seen medially in this vicinity. Pancreas: Pancreatic and peripancreatic  edema reason the possibility of acute pancreatitis. No overt acute fluid collection or pseudocyst. No abscess or necrosis. No dorsal pancreatic duct dilatation. Spleen: Scattered punctate calcifications compatible with old granulomatous disease. Adrenals/Urinary Tract: Unremarkable Stomach/Bowel: Unremarkable Vascular/Lymphatic: A porta hepatis lymph node measures 1.2 cm in short axis on image 32/3 and previously measured 1.0 cm. A separate adjacent lymph node is slightly enlarged at 1.0 cm on image 36/3. These nodes are likely reactive. Reproductive: Curvilinear calcifications centrally in the prostate gland. Other: Perihepatic ascites tracking in the right paracolic gutter. There is free intraperitoneal gas scattered adjacent to the liver but also free in the anterior upper abdomen for example on image 49/ 3. This amount of gas is not typical for postoperative day 9 status post cholecystectomy, but there is a residual drain in place in the right abdomen. I do not see a compelling location for perforated viscus. Musculoskeletal: Unremarkable IMPRESSION: 1. Perihepatic ascites may be due to the patient's reported bile leak. There is a stent extending from the common hepatic duct into the duodenum, and no current biliary dilatation. 2. There is a greater amount of free intraperitoneal gas than I would expect for postop day 9 status post laparoscopic cholecystectomy. However, there is an abdominal drain in place, and drains can sometimes prolong pneumoperitoneum. I do not see discrete abscesses although some of the gas is locular. I do not see any dumping of contrast medium from the bowel to further suggest a perforated viscus. Strictly speaking, I cannot exclude gas extending from the duodenum through the stented common bile duct to leak from the cystic duct remnant. Correlate with patient's overall clinical progress. 3. Peripancreatic edema suspicious for acute pancreatitis. 4. Reactive porta hepatis lymph nodes. 5.  Atelectasis in both lower lobes. Radiology assistant personnel have been notified to put me in telephone contact with the referring physician or the referring physician's clinical representative in order to discuss these findings. Once this communication is established I will issue an addendum to this report for documentation purposes. Electronically Signed: By: Van Clines M.D. On: 09/14/2017 20:28   Dg Ercp Biliary & Pancreatic Ducts  Result Date: 09/14/2017 CLINICAL DATA:  Postoperative bile leak after cholecystectomy. EXAM: ERCP TECHNIQUE: Multiple spot images obtained with the fluoroscopic device and submitted for interpretation post-procedure. COMPARISON:  None. FINDINGS: Imaging with a C-arm during ERCP demonstrates cannulation of a normal caliber common bile duct with contrast injection demonstrating extravasation of contrast near the level of cystic duct clips and an indwelling surgical drain. A common bile duct endoscopic stent was placed. IMPRESSION: Cholangiogram demonstrates bile leak near the level of cystic duct clips. An endoscopic biliary stent was placed in the common bile duct. These images were submitted for radiologic interpretation only. Please see the procedural report for the amount of contrast and the fluoroscopy time utilized. Electronically Signed   By: Aletta Edouard M.D.   On: 09/14/2017 10:58    Anti-infectives: Anti-infectives (From admission, onward)   Start     Dose/Rate Route Frequency Ordered Stop   09/14/17 2100  ciprofloxacin (CIPRO) IVPB 400 mg  400 mg 200 mL/hr over 60 Minutes Intravenous Every 12 hours 09/14/17 2047     09/14/17 2100  metroNIDAZOLE (FLAGYL) IVPB 500 mg     500 mg 100 mL/hr over 60 Minutes Intravenous Every 6 hours 09/14/17 2047     09/14/17 0830  ciprofloxacin (CIPRO) IVPB 400 mg     400 mg 200 mL/hr over 60 Minutes Intravenous To Short Stay 09/14/17 0824 09/14/17 0952       Assessment/Plan  DM - SSI  Bile Leak S/P Lap  chole with drain placement, 12/13, Dr. Ninfa Linden - admitted for bile leak - S/P ERCP and stent placement, Dr. Carlean Purl, 12/22             Leak at cystic duct - CT after ERCP showed more than expected gas/air s/p lap cole, bile and fluid in right gutter and GB fossa, and CT changes of pancreatitis. Air likely from ERCP - drain with 80 cc output in 24 h - continue drain - clinically patient seems to be improving, may be able to advance diet pending labs and GI recommendations Pancreatitis- s/p ERCP - Lipase yesterday 1367, labs pending from this AM  Leukocytosis - likely reactionary, afebrile, CBC pending Tachycardia - HR in the 130s, may be from volume vs infectious  DDU:KGURKY, IVF; added bowel regimen VTE: SCD's, lovenox ID:IV cipro/flagyl 12/22>> Follow up:Dr. Ninfa Linden  DISPO:Labs pending. GI following, will await further recommendations.     LOS: 3 days    Brigid Re , [facility] 09/16/2017, 7:39 AM Pager: [phone] Consults: [phone] Mon-Fri 7:00 am-4:30 pm Sat-Sun 7:00 am-11:30 am

## 2017-09-16 NOTE — Progress Notes (Signed)
Daily Rounding Note  09/16/2017, 9:22 AM  LOS: 3 days   SUBJECTIVE:   Chief complaint: nausea, vomiting: better.  Still some queasiness.  No vomiting.  abd pain better earlier today, but comes back fairly quickly when his pain med wears off. No BM for a few days.   Bile drainage 80 ml yesterday.    OBJECTIVE:         Vital signs in last 24 hours:    Temp:  [98 F (36.7 C)-98.7 F (37.1 C)] 98 F (36.7 C) (12/24 0526) Pulse Rate:  [116-132] 131 (12/24 0526) Resp:  [18-20] 20 (12/24 0526) BP: (133-149)/(77-90) 133/90 (12/24 0526) SpO2:  [91 %-98 %] 98 % (12/24 0526) Last BM Date: 09/13/17 Filed Weights   09/13/17 1810  Weight: 104.9 kg (231 lb 4.2 oz)   General: looks mildly ill and uncomfortable.  Obese.  alert   Heart: RRR Chest: fine rales  In bases.  No cough or dyspnea Abdomen: soft, obese.  tnder RUQ> LUQ.  No guard or rebound.  JB drain in RUQ with bilious drainage.    Extremities: no CCE Neuro/Psych:  Oriented x 3.  No weakness of deficits.   No tremors.    Intake/Output from previous day: 12/23 0701 - 12/24 0700 In: 1272.5 [P.O.:240; I.V.:432.5; IV Piggyback:600] Out: 80 [Drains:80]  Intake/Output this shift: Total I/O In: 240 [P.O.:240] Out: -   Lab Results: Recent Labs    09/15/17 0514 09/15/17 1052 09/16/17 0742  WBC 17.9* 18.2* 23.2*  HGB 17.0 17.0 15.9  HCT 48.4 47.7 45.4  PLT 279 295 263   BMET Recent Labs    09/13/17 1822 09/15/17 0514 09/16/17 0558  NA 137 135 136  K 3.8 4.6 4.2  CL 105 103 105  CO2 26 21* 23  GLUCOSE 216* 210* 155*  BUN 12 8 7   CREATININE 0.99 0.87 0.75  CALCIUM 8.9 8.8* 8.7*   LFT Recent Labs    09/13/17 1822 09/15/17 0514 09/16/17 0558  PROT 6.3* 7.0 5.5*  ALBUMIN 3.5 3.0* 2.5*  AST 20 14* 10*  ALT 24 21 13*  ALKPHOS 74 70 64  BILITOT 0.7 1.3* 1.1   PT/INR Recent Labs    09/13/17 1822  LABPROT 12.8  INR 0.97   Hepatitis Panel No  results for input(s): HEPBSAG, HCVAB, HEPAIGM, HEPBIGM in the last 72 hours.  Studies/Results: Ct Abdomen Pelvis W Contrast  Addendum Date: 09/14/2017   ADDENDUM REPORT: 09/14/2017 21:20 ADDENDUM: The original report was by Dr. Van Clines. The following addendum is by Dr. Van Clines: Critical Value/emergent results were called by telephone at the time of interpretation on 09/14/2017 at 9:19 pm to Dr. Silvano Rusk , who verbally acknowledged these results. We discussed possible causes for the greater than expected free intraperitoneal gas, including conceivably leak from the leaking cystic duct remnant. Electronically Signed   By: Van Clines M.D.   On: 09/14/2017 21:20   Result Date: 09/14/2017 CLINICAL DATA:  Post cholecystectomy bile leak, continued right upper quadrant abdominal pain and fever. Postop day 9. EXAM: CT ABDOMEN AND PELVIS WITH CONTRAST TECHNIQUE: Multidetector CT imaging of the abdomen and pelvis was performed using the standard protocol following bolus administration of intravenous contrast. CONTRAST:  147mL ISOVUE-300 IOPAMIDOL (ISOVUE-300) INJECTION 61% COMPARISON:  Multiple exams, including 09/05/2017 FINDINGS: Lower chest: Considerable atelectasis in both lower lobes and the lingula, new compared to 11/06/2016. Faint pericardial calcification along the cardiac apex. Hepatobiliary: No focal  hepatic parenchymal lesion is identified. Small amount of gas in the common hepatic duct. Biliary stent noted with slight extension into the common hepatic duct but mostly in the common bile duct and extending to the duodenum. Clips along the gallbladder fossa near the cystic duct remnant. There is a small amount of extraluminal gas adjacent to these clips, and a percutaneous drainage catheter also tracks seen medially in this vicinity. Pancreas: Pancreatic and peripancreatic edema reason the possibility of acute pancreatitis. No overt acute fluid collection or pseudocyst. No  abscess or necrosis. No dorsal pancreatic duct dilatation. Spleen: Scattered punctate calcifications compatible with old granulomatous disease. Adrenals/Urinary Tract: Unremarkable Stomach/Bowel: Unremarkable Vascular/Lymphatic: A porta hepatis lymph node measures 1.2 cm in short axis on image 32/3 and previously measured 1.0 cm. A separate adjacent lymph node is slightly enlarged at 1.0 cm on image 36/3. These nodes are likely reactive. Reproductive: Curvilinear calcifications centrally in the prostate gland. Other: Perihepatic ascites tracking in the right paracolic gutter. There is free intraperitoneal gas scattered adjacent to the liver but also free in the anterior upper abdomen for example on image 49/ 3. This amount of gas is not typical for postoperative day 9 status post cholecystectomy, but there is a residual drain in place in the right abdomen. I do not see a compelling location for perforated viscus. Musculoskeletal: Unremarkable IMPRESSION: 1. Perihepatic ascites may be due to the patient's reported bile leak. There is a stent extending from the common hepatic duct into the duodenum, and no current biliary dilatation. 2. There is a greater amount of free intraperitoneal gas than I would expect for postop day 9 status post laparoscopic cholecystectomy. However, there is an abdominal drain in place, and drains can sometimes prolong pneumoperitoneum. I do not see discrete abscesses although some of the gas is locular. I do not see any dumping of contrast medium from the bowel to further suggest a perforated viscus. Strictly speaking, I cannot exclude gas extending from the duodenum through the stented common bile duct to leak from the cystic duct remnant. Correlate with patient's overall clinical progress. 3. Peripancreatic edema suspicious for acute pancreatitis. 4. Reactive porta hepatis lymph nodes. 5. Atelectasis in both lower lobes. Radiology assistant personnel have been notified to put me in  telephone contact with the referring physician or the referring physician's clinical representative in order to discuss these findings. Once this communication is established I will issue an addendum to this report for documentation purposes. Electronically Signed: By: Van Clines M.D. On: 09/14/2017 20:28   Dg Ercp Biliary & Pancreatic Ducts  Result Date: 09/14/2017 CLINICAL DATA:  Postoperative bile leak after cholecystectomy. EXAM: ERCP TECHNIQUE: Multiple spot images obtained with the fluoroscopic device and submitted for interpretation post-procedure. COMPARISON:  None. FINDINGS: Imaging with a C-arm during ERCP demonstrates cannulation of a normal caliber common bile duct with contrast injection demonstrating extravasation of contrast near the level of cystic duct clips and an indwelling surgical drain. A common bile duct endoscopic stent was placed. IMPRESSION: Cholangiogram demonstrates bile leak near the level of cystic duct clips. An endoscopic biliary stent was placed in the common bile duct. These images were submitted for radiologic interpretation only. Please see the procedural report for the amount of contrast and the fluoroscopy time utilized. Electronically Signed   By: Aletta Edouard M.D.   On: 09/14/2017 10:58   Scheduled Meds: . docusate sodium  100 mg Oral BID  . enoxaparin (LOVENOX) injection  40 mg Subcutaneous Q24H  . insulin aspart  0-20 Units Subcutaneous TID WC   Continuous Infusions: . ciprofloxacin Stopped (09/16/17 0903)  . dextrose 5% lactated ringers with KCl 20 mEq/L 150 mL/hr at 09/16/17 0719  . metronidazole Stopped (09/16/17 0903)   PRN Meds:.HYDROmorphone (DILAUDID) injection, ondansetron **OR** ondansetron (ZOFRAN) IV, oxyCODONE, polyethylene glycol, promethazine   ASSESMENT:   *  Post chole bile leak.  Cholecystectomy 12/13.  ERCP and stented 12/22 Remains on flagyl, cipro for elevated WBCs Lipase improved.    *  Post ERCP pancreatitis.   Clinically improving.    Upper abdominal pain  PLAN   *  Continue supportive care with IVF.  Leave clear diet in place.  IVF of LR at 150/hour.      Derrick West  09/16/2017, 9:22 AM Pager: [phone]  I have discussed the case with the PA, and that is the plan I formulated. I personally interviewed and examined the patient.  Although his lipase is declining, he still has significant abdominal pain from the post ERCP pancreatitis, and I am concerned that his leukocytosis is worsening.  I doubt he has infected necrosis at this point, but I will leave the choice of whether or not to continue antibiotics to his primary medical team.  It would be very unusual for pancreatitis to become infected within a week.  As bile leak appears to be improving with a reported significant decrease in the biliary drain output since the ERCP.  He has had about 20 cc of drainage since the drain was emptied earlier this morning.  He appears to have had 80 cc of total output in the previous 24 hours.  We will continue to manage him with plenty of IV fluids, pain control, antiemetics as needed and bowel rest.  I absolutely would not advance beyond clear liquids at this point.  I will follow him closely, and if he continues to worsen, he will likely need a nasoduodenal feeding tube.  Total time 30 minutes, over half spent in chart review, counseling and coordinating care.  Care plan discussed with patient's nurse. Nelida Meuse III Pager [phone]  Mon-Fri 8a-5p [phone] after 5p, weekends, holidays

## 2017-09-17 ENCOUNTER — Encounter (HOSPITAL_COMMUNITY): Payer: Self-pay | Admitting: *Deleted

## 2017-09-17 DIAGNOSIS — K8581 Other acute pancreatitis with uninfected necrosis: Secondary | ICD-10-CM

## 2017-09-17 DIAGNOSIS — D72829 Elevated white blood cell count, unspecified: Secondary | ICD-10-CM

## 2017-09-17 DIAGNOSIS — R1010 Upper abdominal pain, unspecified: Secondary | ICD-10-CM

## 2017-09-17 LAB — COMPREHENSIVE METABOLIC PANEL
ALT: 10 U/L — ABNORMAL LOW (ref 17–63)
AST: 12 U/L — ABNORMAL LOW (ref 15–41)
Albumin: 2.2 g/dL — ABNORMAL LOW (ref 3.5–5.0)
Alkaline Phosphatase: 73 U/L (ref 38–126)
Anion gap: 11 (ref 5–15)
BUN: 6 mg/dL (ref 6–20)
CO2: 24 mmol/L (ref 22–32)
Calcium: 8.4 mg/dL — ABNORMAL LOW (ref 8.9–10.3)
Chloride: 101 mmol/L (ref 101–111)
Creatinine, Ser: 0.75 mg/dL (ref 0.61–1.24)
GFR calc Af Amer: >60 mL/min (ref >60)
GFR calc non Af Amer: >60 mL/min (ref >60)
Glucose, Bld: 172 mg/dL — ABNORMAL HIGH (ref 65–99)
Potassium: 4 mmol/L (ref 3.5–5.1)
Sodium: 136 mmol/L (ref 135–145)
Total Bilirubin: 1.1 mg/dL (ref 0.3–1.2)
Total Protein: 5.4 g/dL — ABNORMAL LOW (ref 6.5–8.1)

## 2017-09-17 LAB — GLUCOSE, CAPILLARY
Glucose-Capillary: 174 mg/dL — ABNORMAL HIGH (ref 65–99)
Glucose-Capillary: 164 mg/dL — ABNORMAL HIGH (ref 65–99)
Glucose-Capillary: 156 mg/dL — ABNORMAL HIGH (ref 65–99)
Glucose-Capillary: 194 mg/dL — ABNORMAL HIGH (ref 65–99)

## 2017-09-17 LAB — URINE CULTURE: Culture: NO GROWTH

## 2017-09-17 LAB — CBC
HCT: 42 % (ref 39.0–52.0)
Hemoglobin: 14.2 g/dL (ref 13.0–17.0)
MCH: 29 pg (ref 26.0–34.0)
MCHC: 33.8 g/dL (ref 30.0–36.0)
MCV: 85.7 fL (ref 78.0–100.0)
Platelets: 237 10*3/uL (ref 150–400)
RBC: 4.9 MIL/uL (ref 4.22–5.81)
RDW: 13.2 % (ref 11.5–15.5)
WBC: 17.3 10*3/uL — ABNORMAL HIGH (ref 4.0–10.5)

## 2017-09-17 LAB — LIPASE, BLOOD: Lipase: 64 U/L — ABNORMAL HIGH (ref 11–51)

## 2017-09-17 MED ORDER — POLYETHYLENE GLYCOL 3350 17 G PO PACK
17.0000 g | PACK | Freq: Every day | ORAL | Status: DC
  Administered 2017-09-18 – 2017-09-19 (×2): 17 g via ORAL
  Filled 2017-09-17 (×6): qty 1

## 2017-09-17 MED ORDER — METOCLOPRAMIDE HCL 5 MG/ML IJ SOLN
10.0000 mg | Freq: Once | INTRAMUSCULAR | Status: AC
  Administered 2017-09-17: 10 mg via INTRAVENOUS
  Filled 2017-09-17: qty 2

## 2017-09-17 NOTE — Progress Notes (Addendum)
[facility] Progress Note  Chief Complaint: Post ERCP pancreatitis  Subjective  History:  The patient continues to have diffuse upper abdominal pain with worsening bloating after inking clear liquids. He is nauseated but there is no vomiting. He had a small amount of flat [place] earlier today, no bowel movement since her admission several days ago. His biliary drain output is decreasing, only recorded as 25 mL yesterday. There appears to be about 10 ML's in the JP drain since it was emptied this morning.  ROS: Cardiovascular:  no chest pain Respiratory: no dyspnea  Objective:  Med list reviewed  Vital signs in last 24 hrs: Vitals:   09/17/17 0101 09/17/17 0647  BP: (!) 146/87 137/82  Pulse: (!) 122 (!) 115  Resp: 19 18  Temp: 98 F (36.7 C) 98.7 F (37.1 C)  SpO2: 94% 93%    Physical Exam Nontoxic, but clearly uncomfortable, laying back and stretching out his arms to get some relief.  HEENT: sclera anicteric, oral mucosa moist without lesions  Neck: supple, no thyromegaly, JVD or lymphadenopathy  Cardiac: RRR without murmurs, S1S2 heard, no peripheral edema  Pulm: clear to auscultation bilaterally, normal RR and effort noted  Abdomen: soft, moderate upper abdominal tenderness, with decreased but active bowel sounds. No guarding or palpable hepatosplenomegaly. No distention  Skin; warm and dry, no jaundice or rash  Recent Labs:  Recent Labs  Lab 09/15/17 1052 09/16/17 0742 09/17/17 0742  WBC 18.2* 23.2* 17.3*  HGB 17.0 15.9 14.2  HCT 47.7 45.4 42.0  PLT 295 263 237   Recent Labs  Lab 09/17/17 0742  NA 136  K 4.0  CL 101  CO2 24  BUN 6  ALBUMIN 2.2*  ALKPHOS 73  ALT 10*  AST 12*  GLUCOSE 172*  Total bilirubin 1.1  Recent Labs  Lab 09/13/17 1822  INR 0.97    @ASSESSMENTPLANBEGIN @ Assessment: Post-ERCP pancreatitis Bile leak, status post ERCP and biliary stent placement. So far appears to be successful, as the leak is significantly improved as  evidenced by decreased JP drain output. Upper abdominal pain from pancreatitis  Although he does not feel much better today than yesterday, encouraging signs with decrease in lipase and WBC.  Plan: Continue current management plan with IV fluids, pain control and antiemetics and I would not advance his diet be on clear liquids at this point.  He has had no solid food for at least 4 days. If he gets to about a week, he and if he still cannot tolerate much nutrition by mouth, he may need placement of a nasoduodenal tube. I am certainly hoping it does not come to that, but I explained that to him is a possibility.  I also do not think he has infected necrosis, so I discontinued his antibiotics.  We will follow him daily.   Total time 25 minutes, over half spent counseling and coordinating care. Nelida Meuse III Pager [phone] Mon-Fri 8a-5p [phone] after 5p, weekends, holidays

## 2017-09-17 NOTE — Progress Notes (Signed)
[facility] Progress Note  3 Days Post-Op  Subjective: CC: nausea Patient reported slightly worsened nausea over the course of the last 24 h. No vomiting. Some abdominal pain, but mostly with movement. Passing flatus, no BM.  UOP good. Tachycardic, afebrile.   Objective: Vital signs in last 24 hours: Temp:  [98 F (36.7 C)-100.7 F (38.2 C)] 98.7 F (37.1 C) (12/25 0647) Pulse Rate:  [115-124] 115 (12/25 0647) Resp:  [18-20] 18 (12/25 0647) BP: (118-146)/(81-87) 137/82 (12/25 0647) SpO2:  [92 %-94 %] 93 % (12/25 0647) Last BM Date: 09/17/17  Intake/Output from previous day: 12/24 0701 - 12/25 0700 In: 2380 [P.O.:780; I.V.:1200; IV Piggyback:400] Out: 25 [Drains:25] Intake/Output this shift: Total I/O In: -  Out: 10 [Drains:10]  PE: Gen:  Alert, NAD, pleasant Card:  Regular rate and rhythm, pedal pulses 2+ BL Pulm:  Normal effort, clear to auscultation bilaterally Abd: Soft, non-tender, non-distended, bowel sounds present, no HSM, incisions C/D/I, drain with minimal bilious drainage Skin: warm and dry, no rashes  Psych: A&Ox3   Lab Results:  Recent Labs    09/15/17 1052 09/16/17 0742  WBC 18.2* 23.2*  HGB 17.0 15.9  HCT 47.7 45.4  PLT 295 263   BMET Recent Labs    09/15/17 0514 09/16/17 0558  NA 135 136  K 4.6 4.2  CL 103 105  CO2 21* 23  GLUCOSE 210* 155*  BUN 8 7  CREATININE 0.87 0.75  CALCIUM 8.8* 8.7*   PT/INR No results for input(s): LABPROT, INR in the last 72 hours. CMP     Component Value Date/Time   NA 136 09/16/2017 0558   K 4.2 09/16/2017 0558   CL 105 09/16/2017 0558   CO2 23 09/16/2017 0558   GLUCOSE 155 (H) 09/16/2017 0558   BUN 7 09/16/2017 0558   CREATININE 0.75 09/16/2017 0558   CALCIUM 8.7 (L) 09/16/2017 0558   PROT 5.5 (L) 09/16/2017 0558   ALBUMIN 2.5 (L) 09/16/2017 0558   AST 10 (L) 09/16/2017 0558   ALT 13 (L) 09/16/2017 0558   ALKPHOS 64 09/16/2017 0558   BILITOT 1.1 09/16/2017 0558   GFRNONAA >60  09/16/2017 0558   GFRAA >60 09/16/2017 0558   Lipase     Component Value Date/Time   LIPASE 497 (H) 09/16/2017 0558       Studies/Results: Dg Chest Port 1 View  Result Date: 09/16/2017 CLINICAL DATA:  Leukocytosis EXAM: PORTABLE CHEST 1 VIEW COMPARISON:  November 23, 2015 FINDINGS: No pneumothorax. Mild opacity in the medial right lung base is new. Opacity in the left base is somewhat platelike. The heart, hila, mediastinum, lungs, and pleura are otherwise unremarkable. IMPRESSION: 1. Mild infiltrate in the medial right lung base suggests pneumonia given history. Opacity in left base is more platelike raising the possibility of atelectasis versus infiltrate. Recommend follow-up to resolution. Electronically Signed   By: Dorise Bullion III M.D   On: 09/16/2017 09:40    Anti-infectives: Anti-infectives (From admission, onward)   Start     Dose/Rate Route Frequency Ordered Stop   09/14/17 2100  ciprofloxacin (CIPRO) IVPB 400 mg     400 mg 200 mL/hr over 60 Minutes Intravenous Every 12 hours 09/14/17 2047     09/14/17 2100  metroNIDAZOLE (FLAGYL) IVPB 500 mg     500 mg 100 mL/hr over 60 Minutes Intravenous Every 6 hours 09/14/17 2047     09/14/17 0830  ciprofloxacin (CIPRO) IVPB 400 mg     400 mg 200 mL/hr over  60 Minutes Intravenous To Short Stay 09/14/17 0824 09/14/17 0952       Assessment/Plan DM - SSI  Bile Leak S/P Lap chole with drain placement, 12/13, Dr. Ninfa Linden - admitted for bile leak -S/PERCP and stent placement, Dr. Carlean Purl, 12/22 Leak at cystic duct - CT after ERCP showedmore than expected gas/air s/p lap cole, bile and fluid in right gutter and GB fossa, and CT changes of pancreatitis. Air likely from ERCP - drain with 25 cc output in 24 h - continue drain - patient with worsened nausea, keep on CLD  Pancreatitis- s/p ERCP - Lipase yesterday 497, labs not yet drawn this AM  Leukocytosis - likely reactionary, afebrile, CBC pending Tachycardia  - HR in the 120s, improving - will continue to monitor  TKW:IOXBDZ, IVF; bowel regimen VTE: SCD's, lovenox ID:IV cipro/flagyl 12/22>> Follow up:Dr. Ninfa Linden  DISPO:Continue treatment of pancreatitis with fluids, prn antiemetics and pain control. Will discuss stopping abx with MD. Labs pending.     LOS: 4 days    Brigid Re , [facility] 09/17/2017, 7:40 AM Pager: [phone] Consults: [phone] Mon-Fri 7:00 am-4:30 pm Sat-Sun 7:00 am-11:30 am

## 2017-09-18 LAB — GLUCOSE, CAPILLARY
Glucose-Capillary: 158 mg/dL — ABNORMAL HIGH (ref 65–99)
Glucose-Capillary: 167 mg/dL — ABNORMAL HIGH (ref 65–99)
Glucose-Capillary: 320 mg/dL — ABNORMAL HIGH (ref 65–99)
Glucose-Capillary: 262 mg/dL — ABNORMAL HIGH (ref 65–99)
Glucose-Capillary: 193 mg/dL — ABNORMAL HIGH (ref 65–99)
Glucose-Capillary: 140 mg/dL — ABNORMAL HIGH (ref 65–99)

## 2017-09-18 LAB — CBC
HCT: 44.1 % (ref 39.0–52.0)
Hemoglobin: 15 g/dL (ref 13.0–17.0)
MCH: 29.4 pg (ref 26.0–34.0)
MCHC: 34 g/dL (ref 30.0–36.0)
MCV: 86.3 fL (ref 78.0–100.0)
Platelets: 273 10*3/uL (ref 150–400)
RBC: 5.11 MIL/uL (ref 4.22–5.81)
RDW: 13.3 % (ref 11.5–15.5)
WBC: 14.9 10*3/uL — ABNORMAL HIGH (ref 4.0–10.5)

## 2017-09-18 MED ORDER — TRAMADOL HCL 50 MG PO TABS
50.0000 mg | ORAL_TABLET | Freq: Four times a day (QID) | ORAL | Status: DC | PRN
  Administered 2017-09-19 – 2017-09-22 (×4): 50 mg via ORAL
  Filled 2017-09-18 (×5): qty 1

## 2017-09-18 MED ORDER — GLUCERNA SHAKE PO LIQD
237.0000 mL | Freq: Three times a day (TID) | ORAL | Status: DC
  Administered 2017-09-18 – 2017-09-22 (×3): 237 mL via ORAL

## 2017-09-18 MED ORDER — ACETAMINOPHEN 500 MG PO TABS
1000.0000 mg | ORAL_TABLET | Freq: Three times a day (TID) | ORAL | Status: DC
  Administered 2017-09-18 – 2017-09-23 (×16): 1000 mg via ORAL
  Filled 2017-09-18 (×16): qty 2

## 2017-09-18 MED ORDER — BOOST / RESOURCE BREEZE PO LIQD CUSTOM
1.0000 | Freq: Three times a day (TID) | ORAL | Status: DC
  Administered 2017-09-18: 1 via ORAL

## 2017-09-18 MED ORDER — BISACODYL 10 MG RE SUPP
10.0000 mg | Freq: Every day | RECTAL | Status: DC | PRN
  Filled 2017-09-18: qty 1

## 2017-09-18 NOTE — Progress Notes (Signed)
[facility] Progress Note  Chief Complaint: Post ERCP pancreatitis  Subjective  History:  Mr. Licciardi is feeling better today, he has started passing some gas but no bowel movement yet.  He also has less pain, but still requires opioid analgesics on a regular basis.  He has less nausea and was able to tolerate clear liquids all day yesterday.  He has been advanced to full liquid diet by the surgical service.  No fever or chills.  ROS: Cardiovascular:  no chest pain Respiratory: no dyspnea  Objective:  Med list reviewed  Vital signs in last 24 hrs: Vitals:   09/17/17 2106 09/18/17 0611  BP: (!) 154/90 (!) 147/87  Pulse: (!) 109 (!) 109  Resp: 18 19  Temp: 99.4 F (37.4 C) 99 F (37.2 C)  SpO2: 94% 93%    Physical Exam He certainly looks better today, and his overall affect and color are improved.  HEENT: sclera anicteric, oral mucosa moist without lesions  Neck: supple, no thyromegaly, JVD or lymphadenopathy  Cardiac: RRR without murmurs, S1S2 heard, no peripheral edema  Pulm: clear to auscultation bilaterally, normal RR and effort noted  Abdomen: soft, mild epigastric tenderness (improved from yesterday), with active bowel sounds. No guarding or palpable hepatosplenomegaly  Skin; warm and dry, no jaundice or rash  Recent Labs:  Recent Labs  Lab 09/15/17 1052 09/16/17 0742 09/17/17 0742  WBC 18.2* 23.2* 17.3*  HGB 17.0 15.9 14.2  HCT 47.7 45.4 42.0  PLT 295 263 237   Recent Labs  Lab 09/17/17 0742  NA 136  K 4.0  CL 101  CO2 24  BUN 6  ALBUMIN 2.2*  ALKPHOS 73  ALT 10*  AST 12*  GLUCOSE 172*   Recent Labs  Lab 09/13/17 1822  INR 0.97   His morning labs from today are not yet resulted  Radiologic studies: None today  @ASSESSMENTPLANBEGIN @ Assessment:  Post ERCP pancreatitis, improving Bile leak after lap chole, minimal drainage now.  Status post ERCP with stent on 09/14/2017, it appears to be working.   Plan:  I will add some protein  calorie drinks to his full liquid diet and we will continue to follow him. CBC and CMP tomorrow, hopefully home by the end of the week. When he is ready to go home, we will arrange follow-up with Dr. Carlean Purl for removal of the biliary stent in several weeks.  Nelida Meuse III Pager [phone] Mon-Fri 8a-5p [phone] after 5p, weekends, holidays

## 2017-09-18 NOTE — Progress Notes (Signed)
[facility] Progress Note  4 Days Post-Op  Subjective: CC: abdominal pain and nausea Patient states he is still having abdominal pain, mostly in epigastrium with some generalized. Does report that discomfort and nausea have improved since yesterday. He has been able to pass more flatus, no BM.  UOP good. Mild tachycardia.   Objective: Vital signs in last 24 hours: Temp:  [98.9 F (37.2 C)-99.4 F (37.4 C)] 99 F (37.2 C) (12/26 0611) Pulse Rate:  [109-110] 109 (12/26 0611) Resp:  [18-19] 19 (12/26 0611) BP: (147-154)/(84-90) 147/87 (12/26 0611) SpO2:  [92 %-94 %] 93 % (12/26 0611) Last BM Date: 09/13/17  Intake/Output from previous day: 12/25 0701 - 12/26 0700 In: 3460 [P.O.:50; I.V.:3410] Out: 10 [Drains:10] Intake/Output this shift: No intake/output data recorded.  PE: Gen:  Alert, NAD, pleasant Card:  Regular rate and rhythm, pedal pulses 2+ BL Pulm:  Normal effort, clear to auscultation bilaterally Abd: Soft, minimally tender in epigastric region, non-distended, bowel sounds hypoactive, no HSM, incisions C/D/I, drain with minimal bilious drainage Skin: warm and dry, no rashes  Psych: A&Ox3   Lab Results:  Recent Labs    09/16/17 0742 09/17/17 0742  WBC 23.2* 17.3*  HGB 15.9 14.2  HCT 45.4 42.0  PLT 263 237   BMET Recent Labs    09/16/17 0558 09/17/17 0742  NA 136 136  K 4.2 4.0  CL 105 101  CO2 23 24  GLUCOSE 155* 172*  BUN 7 6  CREATININE 0.75 0.75  CALCIUM 8.7* 8.4*   PT/INR No results for input(s): LABPROT, INR in the last 72 hours. CMP     Component Value Date/Time   NA 136 09/17/2017 0742   K 4.0 09/17/2017 0742   CL 101 09/17/2017 0742   CO2 24 09/17/2017 0742   GLUCOSE 172 (H) 09/17/2017 0742   BUN 6 09/17/2017 0742   CREATININE 0.75 09/17/2017 0742   CALCIUM 8.4 (L) 09/17/2017 0742   PROT 5.4 (L) 09/17/2017 0742   ALBUMIN 2.2 (L) 09/17/2017 0742   AST 12 (L) 09/17/2017 0742   ALT 10 (L) 09/17/2017 0742   ALKPHOS 73  09/17/2017 0742   BILITOT 1.1 09/17/2017 0742   GFRNONAA >60 09/17/2017 0742   GFRAA >60 09/17/2017 0742   Lipase     Component Value Date/Time   LIPASE 64 (H) 09/17/2017 0742       Studies/Results: Dg Chest Port 1 View  Result Date: 09/16/2017 CLINICAL DATA:  Leukocytosis EXAM: PORTABLE CHEST 1 VIEW COMPARISON:  November 23, 2015 FINDINGS: No pneumothorax. Mild opacity in the medial right lung base is new. Opacity in the left base is somewhat platelike. The heart, hila, mediastinum, lungs, and pleura are otherwise unremarkable. IMPRESSION: 1. Mild infiltrate in the medial right lung base suggests pneumonia given history. Opacity in left base is more platelike raising the possibility of atelectasis versus infiltrate. Recommend follow-up to resolution. Electronically Signed   By: Dorise Bullion III M.D   On: 09/16/2017 09:40    Anti-infectives: Anti-infectives (From admission, onward)   Start     Dose/Rate Route Frequency Ordered Stop   09/14/17 2100  ciprofloxacin (CIPRO) IVPB 400 mg  Status:  Discontinued     400 mg 200 mL/hr over 60 Minutes Intravenous Every 12 hours 09/14/17 2047 09/17/17 0918   09/14/17 2100  metroNIDAZOLE (FLAGYL) IVPB 500 mg  Status:  Discontinued     500 mg 100 mL/hr over 60 Minutes Intravenous Every 6 hours 09/14/17 2047 09/17/17 7989  09/14/17 0830  ciprofloxacin (CIPRO) IVPB 400 mg     400 mg 200 mL/hr over 60 Minutes Intravenous To Short Stay 09/14/17 0824 09/14/17 0952       Assessment/Plan DM - SSI  Bile Leak S/P Lap chole with drain placement, 12/13, Dr. Ninfa Linden - admitted for bile leak -S/PERCP and stent placement, Dr. Carlean Purl, 12/22 Leak at cystic duct - CT after ERCP showedmore than expected gas/air s/p lap cole, bile and fluid in right gutter and GB fossa, and CT changes of pancreatitis. Air likely from ERCP - drain with 10 cc output in 24 h - continue drain - patient with some improvement in nausea, will advance to  fulls only - colace BID, miralax daily, dulcolax suppository prn Pancreatitis- s/p ERCP - [id number]  Leukocytosis- likely reactionary, afebrile, trending down, CBC pending today Tachycardia- HR in the 100s, improving - will continue to monitor  FEN:FLD, IVF; bowel regimen VTE: SCD's, lovenox ID:IV cipro/flagyl 12/22>12/25 Follow up:Dr. Ninfa Linden  DISPO:Advance diet to FLD since patient having some improvement in nausea/pain. CBC pending. Will continue to monitor. Hopefully home in the next few days.     LOS: 5 days    Brigid Re , [facility] 09/18/2017, 7:44 AM Pager: [phone] Consults: [phone] Mon-Fri 7:00 am-4:30 pm Sat-Sun 7:00 am-11:30 am

## 2017-09-19 LAB — CBC
HCT: 42.4 % (ref 39.0–52.0)
Hemoglobin: 14.1 g/dL (ref 13.0–17.0)
MCH: 28.9 pg (ref 26.0–34.0)
MCHC: 33.3 g/dL (ref 30.0–36.0)
MCV: 86.9 fL (ref 78.0–100.0)
Platelets: 286 10*3/uL (ref 150–400)
RBC: 4.88 MIL/uL (ref 4.22–5.81)
RDW: 13.1 % (ref 11.5–15.5)
WBC: 15.4 10*3/uL — ABNORMAL HIGH (ref 4.0–10.5)

## 2017-09-19 LAB — GLUCOSE, CAPILLARY
Glucose-Capillary: 181 mg/dL — ABNORMAL HIGH (ref 65–99)
Glucose-Capillary: 154 mg/dL — ABNORMAL HIGH (ref 65–99)
Glucose-Capillary: 140 mg/dL — ABNORMAL HIGH (ref 65–99)
Glucose-Capillary: 134 mg/dL — ABNORMAL HIGH (ref 65–99)

## 2017-09-19 LAB — LIPASE, BLOOD: Lipase: 20 U/L (ref 11–51)

## 2017-09-19 LAB — COMPREHENSIVE METABOLIC PANEL
ALT: 9 U/L — ABNORMAL LOW (ref 17–63)
AST: 11 U/L — ABNORMAL LOW (ref 15–41)
Albumin: 2.2 g/dL — ABNORMAL LOW (ref 3.5–5.0)
Alkaline Phosphatase: 76 U/L (ref 38–126)
Anion gap: 10 (ref 5–15)
BUN: 5 mg/dL — ABNORMAL LOW (ref 6–20)
CO2: 29 mmol/L (ref 22–32)
Calcium: 8.6 mg/dL — ABNORMAL LOW (ref 8.9–10.3)
Chloride: 99 mmol/L — ABNORMAL LOW (ref 101–111)
Creatinine, Ser: 0.66 mg/dL (ref 0.61–1.24)
GFR calc Af Amer: >60 mL/min (ref >60)
GFR calc non Af Amer: >60 mL/min (ref >60)
Glucose, Bld: 179 mg/dL — ABNORMAL HIGH (ref 65–99)
Potassium: 4 mmol/L (ref 3.5–5.1)
Sodium: 138 mmol/L (ref 135–145)
Total Bilirubin: 0.9 mg/dL (ref 0.3–1.2)
Total Protein: 5.6 g/dL — ABNORMAL LOW (ref 6.5–8.1)

## 2017-09-19 MED ORDER — METFORMIN HCL 500 MG PO TABS
1000.0000 mg | ORAL_TABLET | Freq: Two times a day (BID) | ORAL | Status: DC
  Administered 2017-09-19 (×2): 1000 mg via ORAL
  Filled 2017-09-19 (×2): qty 2

## 2017-09-19 MED ORDER — HYDROMORPHONE HCL 1 MG/ML IJ SOLN
0.5000 mg | INTRAMUSCULAR | Status: DC | PRN
  Administered 2017-09-19 – 2017-09-22 (×4): 0.5 mg via INTRAVENOUS
  Filled 2017-09-19 (×6): qty 1

## 2017-09-19 NOTE — Discharge Instructions (Signed)
Surgical Drain Home Care °Surgical drains are used to remove extra fluid that normally builds up in a surgical wound after surgery. A surgical drain helps to heal a surgical wound. Different kinds of surgical drains include: °· Active drains. These drains use suction to pull drainage away from the surgical wound. Drainage flows through a tube to a container outside of the body. It is important to keep the bulb or the drainage container flat (compressed) at all times, except while you empty it. Flattening the bulb or container creates suction. The two most common types of active drains are bulb drains and Hemovac drains. °· Passive drains. These drains allow fluid to drain naturally, by gravity. Drainage flows through a tube to a bandage (dressing) or a container outside of the body. Passive drains do not need to be emptied. The most common type of passive drain is the Penrose drain. ° °A drain is placed during surgery. Immediately after surgery, drainage is usually bright red and a little thicker than water. The drainage may gradually turn yellow or pink and become thinner. It is likely that your health care provider will remove the drain when the drainage stops or when the amount decreases to 1-2 Tbsp (15-30 mL) during a 24-hour period. °How to care for your surgical drain °· Keep the skin around the drain dry and covered with a dressing at all times. °· Check your drain area every day for signs of infection. Check for: °? More redness, swelling, or pain. °? Pus or a bad smell. °? Cloudy drainage. °Follow instructions from your health care provider about how to take care of your drain and how to change your dressing. Change your dressing at least one time every day. Change it more often if needed to keep the dressing dry. Make sure you: °1. Gather your supplies, including: °? Tape. °? Germ-free cleaning solution (sterile saline). °? Split gauze drain sponge: 4 x 4 inches (10 x 10 cm). °? Gauze square: 4 x 4 inches  (10 x 10 cm). °2. Wash your hands with soap and water before you change your dressing. If soap and water are not available, use hand sanitizer. °3. Remove the old dressing. Avoid using scissors to do that. °4. Use sterile saline to clean your skin around the drain. °5. Place the tube through the slit in a drain sponge. Place the drain sponge so that it covers your wound. °6. Place the gauze square or another drain sponge on top of the drain sponge that is on the wound. Make sure the tube is between those layers. °7. Tape the dressing to your skin. °8. If you have an active bulb or Hemovac drain, tape the drainage tube to your skin 1-2 inches (2.5-5 cm) below the place where the tube enters your body. Taping keeps the tube from pulling on any stitches (sutures) that you have. °9. Wash your hands with soap and water. °10. Write down the color of your drainage and how often you change your dressing. ° °How to empty your active bulb or Hemovac drain °1. Make sure that you have a measuring cup that you can empty your drainage into. °2. Wash your hands with soap and water. If soap and water are not available, use hand sanitizer. °3. Gently move your fingers down the tube while squeezing very lightly. This is called stripping the tube. This clears any drainage, clots, or tissue from the tube. °? Do not pull on the tube. °? You may need to strip   the tube several times every day to keep the tube clear. 4. Open the bulb cap or the drain plug. Do not touch the inside of the cap or the bottom of the plug. 5. Empty all of the drainage into the measuring cup. 6. Compress the bulb or the container and replace the cap or the plug. To compress the bulb or the container, squeeze it firmly in the middle while you close the cap or plug the container. 7. Write down the amount of drainage that you have in each 24-hour period. If you have less than 2 Tbsp (30 mL) of drainage during 24 hours, contact your health care  provider. 8. Flush the drainage down the toilet. 9. Wash your hands with soap and water. Contact a health care provider if:  You have more redness, swelling, or pain around your drain area.  The amount of drainage that you have is increasing instead of decreasing.  You have pus or a bad smell coming from your drain area.  You have a fever.  You have drainage that is cloudy.  There is a sudden stop or a sudden decrease in the amount of drainage that you have.  Your tube falls out.  Your active draindoes not stay compressedafter you empty it. This information is not intended to replace advice given to you by your health care provider. Make sure you discuss any questions you have with your health care provider. Document Released: 09/07/2000 Document Revised: 02/16/2016 Document Reviewed: 03/30/2015 Elsevier Interactive Patient Education  2018 [employer].    Endoscopic Retrograde Cholangiopancreatogram, Care After This sheet gives you information about how to care for yourself after your procedure. Your health care provider may also give you more specific instructions. If you have problems or questions, contact your health care provider. What can I expect after the procedure? After the procedure, it is common to have:  Soreness in your throat.  Nausea.  Bloating.  Dizziness.  Tiredness (fatigue).  Follow these instructions at home:  Take over-the-counter and prescription medicines only as told by your health care provider.  Do not drive for 24 hours if you were given a medicine to help you relax (sedative) during your procedure. Have someone stay with you for 24 hours after the procedure.  Return to your normal activities as told by your health care provider. Ask your health care provider what activities are safe for you.  Return to eating what you normally do as soon as you feel well enough or as told by your health care provider.  Keep all follow-up visits as  told by your health care provider. This is important. Contact a health care provider if:  You have pain in your abdomen that does not get better with medicine.  You develop signs of infection, such as: ? Chills. ? Feeling unwell. Get help right away if:  You have difficulty swallowing.  You have worsening pain in your throat, chest, or abdomen.  You vomit bright red blood or a substance that looks like coffee grounds.  You have bloody or very black stools.  You have a fever.  You have a sudden increase in swelling (bloating) in your abdomen. Summary  After the procedure, it is common to feel tired and to have some discomfort in your throat.  Contact your health care provider if you have signs of infection--such as chills or feeling unwell--or if you have pain that does not improve with medicine.  Get help right away if you have trouble  swallowing, worsening pain, bloody or black vomit, bloody or black stools, a fever, or increased swelling in your abdomen.  Keep all follow-up visits as told by your health care provider. This is important. This information is not intended to replace advice given to you by your health care provider. Make sure you discuss any questions you have with your health care provider. Document Released: 07/01/2013 Document Revised: 07/30/2016 Document Reviewed: 07/30/2016 Elsevier Interactive Patient Education  2017 [place] [employer] LAPAROSCOPIC SURGERY: POST OP INSTRUCTIONS Always review your discharge instruction sheet given to you by the facility where your surgery was performed. IF YOU HAVE DISABILITY OR FAMILY LEAVE FORMS, YOU MUST BRING THEM TO THE OFFICE FOR PROCESSING.   DO NOT GIVE THEM TO YOUR DOCTOR.  1. A prescription for pain medication may be given to you upon discharge.  Take your pain medication as prescribed, if needed.  If narcotic pain medicine is not needed, then you may take acetaminophen  (Tylenol) or ibuprofen (Advil) as needed. 2. Take your usually prescribed medications unless otherwise directed. 3. If you need a refill on your pain medication, please contact your pharmacy.  They will contact our office to request authorization. Prescriptions will not be filled after 5pm or on week-ends. 4. You should follow a light diet the first few days after arrival home, such as soup and crackers, etc.  Be sure to include lots of fluids daily. 5. Most patients will experience some swelling and bruising in the area of the incisions.  Ice packs will help.  Swelling and bruising can take several days to resolve.  6. It is common to experience some constipation if taking pain medication after surgery.  Increasing fluid intake and taking a stool softener (such as Colace) will usually help or prevent this problem from occurring.  A mild laxative (Milk of Magnesia or Miralax) should be taken according to package instructions if there are no bowel movements after 48 hours. 7. Unless discharge instructions indicate otherwise, you may remove your bandages 24-48 hours after surgery, and you may shower at that time.  You may have steri-strips (small skin tapes) in place directly over the incision.  These strips should be left on the skin for 7-10 days.  If your surgeon used skin glue on the incision, you may shower in 24 hours.  The glue will flake off over the next 2-3 weeks.  Any sutures or staples will be removed at the office during your follow-up visit. 8. ACTIVITIES:  You may resume regular (light) daily activities beginning the next day--such as daily self-care, walking, climbing stairs--gradually increasing activities as tolerated.  You may have sexual intercourse when it is comfortable.  Refrain from any heavy lifting or straining until approved by your doctor. a. You may drive when you are no longer taking prescription pain medication, you can comfortably wear a seatbelt, and you can safely maneuver  your car and apply brakes. b. RETURN TO WORK:  __________________________________________________________ 9. You should see your doctor in the office for a follow-up appointment approximately 2-3 weeks after your surgery.  Make sure that you call for this appointment within a day or two after you arrive home to insure a convenient appointment time. 10. OTHER INSTRUCTIONS: __________________________________________________________________________________________________________________________ __________________________________________________________________________________________________________________________ WHEN TO CALL YOUR DOCTOR: 1. Fever over 101.0 2. Inability to urinate 3. Continued bleeding from incision. 4. Increased pain, redness, or drainage from the incision. 5. Increasing abdominal pain  The clinic staff is available to answer your  questions during regular business hours.  Please dont hesitate to call and ask to speak to one of the nurses for clinical concerns.  If you have a medical emergency, go to the nearest emergency room or call 911.  A surgeon from [facility] is always on call at the hospital. [address], [place], [place], [place]  [address] ? [address] [place], [place] [address], [place]   [address] [phone] ? [phone] ? FAX [phone] Web site: [email or web address]

## 2017-09-19 NOTE — Progress Notes (Signed)
Inpatient Diabetes Program Recommendations  AACE/ADA: New Consensus Statement on Inpatient Glycemic Control (2015)  Target Ranges:  Prepandial:   less than 140 mg/dL      Peak postprandial:   less than 180 mg/dL (1-2 hours)      Critically ill patients:  140 - 180 mg/dL   Lab Results  Component Value Date   GLUCAP 154 (H) 09/19/2017   HGBA1C 7.9 (H) 11/19/2016    Review of Glycemic Control  Inpatient Diabetes Program Recommendations:   Spoke with patient and wife @ bedside.Patient states Trulicity copay was currently $75 per prescription. Gave patient copay card that will limit copay to $25 as before and patient states affordability @ this cost.  Noted patient has not had a recent A1c. -A1c to determine prehospital glycemic control. Spoke to patient about nutrition and patient states he has not been watching his nutrition intake but willing to try again.  Thank you, Nani Gasser. Amorah Sebring, RN, MSN, CDE  Diabetes Coordinator Inpatient Glycemic Control Team Team Pager [phone] (8am-5pm) 09/19/2017 3:33 PM

## 2017-09-19 NOTE — Progress Notes (Signed)
Daily Rounding Note  09/19/2017, 10:07 AM  LOS: 6 days   SUBJECTIVE:   Chief complaint: pain in RUQ is triggered when he walks and moves about.  He feels like the JP drain is getting jostled.  Had 2 bowel movements yesterday.  Had his first solid, carbamide, meal this morning and there were no problems.  Appetite is still depressed.  Biliary drainage is on the decline.   OBJECTIVE:         Vital signs in last 24 hours:    Temp:  [98.1 F (36.7 C)-99.1 F (37.3 C)] 99.1 F (37.3 C) (12/27 0555) Pulse Rate:  [96-104] 104 (12/27 0555) Resp:  [18] 18 (12/27 0555) BP: (155-164)/(80-89) 155/87 (12/27 0555) SpO2:  [95 %-97 %] 95 % (12/27 0555) Last BM Date: 09/18/17 Filed Weights   09/13/17 1810  Weight: 104.9 kg (231 lb 4.2 oz)   General: Pleasant, looks a little bit tired but overall well and comfortable. Heart: RRR. Chest: Clear bilaterally.  No labored breathing or cough. Abdomen: Soft.  Slight right greater than left upper quadrant tenderness.  JP drain with a minor amount of green, slightly cloudy fluid. Extremities: No CCE. Neuro/Psych: Fully alert and oriented.  Calm, cooperative.  Intake/Output from previous day: 12/26 0701 - 12/27 0700 In: 4093 [P.O.:735; I.V.:3358] Out: 10 [Drains:10]  Intake/Output this shift: No intake/output data recorded.  Lab Results: Recent Labs    09/17/17 0742 09/18/17 0817 09/19/17 0533  WBC 17.3* 14.9* 15.4*  HGB 14.2 15.0 14.1  HCT 42.0 44.1 42.4  PLT 237 273 286   BMET Recent Labs    09/17/17 0742 09/19/17 0533  NA 136 138  K 4.0 4.0  CL 101 99*  CO2 24 29  GLUCOSE 172* 179*  BUN 6 5*  CREATININE 0.75 0.66  CALCIUM 8.4* 8.6*   LFT Recent Labs    09/17/17 0742 09/19/17 0533  PROT 5.4* 5.6*  ALBUMIN 2.2* 2.2*  AST 12* 11*  ALT 10* 9*  ALKPHOS 73 76  BILITOT 1.1 0.9    Scheduled Meds: . acetaminophen  1,000 mg Oral Q8H  . docusate sodium  100 mg  Oral BID  . enoxaparin (LOVENOX) injection  40 mg Subcutaneous Q24H  . feeding supplement (GLUCERNA SHAKE)  237 mL Oral TID BM  . insulin aspart  0-20 Units Subcutaneous TID WC  . metFORMIN  1,000 mg Oral BID WC  . polyethylene glycol  17 g Oral Daily   Continuous Infusions: . dextrose 5% lactated ringers with KCl 20 mEq/L 150 mL/hr at 09/19/17 0825   PRN Meds:.bisacodyl, HYDROmorphone (DILAUDID) injection, ondansetron **OR** ondansetron (ZOFRAN) IV, oxyCODONE, traMADol   ASSESMENT:   *   Post ERCP (Endoscopicretrogradecholangiopancreatography) pancreatitis. Lipase normalized.   WBCs slightly increased today but overall improved.  *   Post lap chole bile leak.  ERCP (Endoscopicretrogradecholangiopancreatography) with stent on 09/14/17. LFTs  *  DM  *   Rising   PLAN   *   As he is reliably taking p.o., will reduce IV fluids from 150 to 100 mL's per hour.    *   Surgery may be discharging him today.  Dr Carlean Purl would like to see him in office in Feb.    *   Repeat CBC in the morning if he has not been discharged in the meantime.Azucena Freed  09/19/2017, 10:07 AM Pager: [phone]  I have discussed the case with the PA, and  that is the plan I formulated. I personally interviewed and examined the patient.  His bile leak has greatly diminished, and his post ERCP pancreatitis is improving.  He is able to tolerate some solid food, but gets bloated easily.  He was also able to have a bowel movement.  It sounds like he may be going home soon, and he is anxious about the possibility of having the drain and at the time of discharge. I will get a message to Dr. Carlean Purl so he can make arrangements for outpatient follow-up and repeat ERCP for stent removal and confirmation of bile leak resolution.    Nelida Meuse III Pager [phone]  Mon-Fri 8a-5p [phone] after 5p, weekends, holidays

## 2017-09-19 NOTE — Progress Notes (Signed)
5 Days Post-Op    YT:[id number] pain  Subjective: Not allot of drainage, he was up to fulls, but did not really take much of it because he did not like options; some nausea last PM.  Minimal drainage from the JP,  Complains of ongoing pain RUQ and blames the drain. Objective: Vital signs in last 24 hours: Temp:  [98.1 F (36.7 C)-99.1 F (37.3 C)] 99.1 F (37.3 C) (12/27 0555) Pulse Rate:  [96-104] 104 (12/27 0555) Resp:  [18] 18 (12/27 0555) BP: (155-164)/(80-89) 155/87 (12/27 0555) SpO2:  [95 %-97 %] 95 % (12/27 0555) Last BM Date: 09/18/17 735 PO.33589 IV Urine x 3 recorded Drain 10 ml Stool x 1 Afebrile, VSS, tachycardia improving some Glucose 320-140 yesterday, 179 this AM Wbc still up 15.4 CXR 12/24 - atelectasis vs Pneumonia  Intake/Output from previous day: 12/26 0701 - 12/27 0700 In: 4093 [P.O.:735; I.V.:3358] Out: 10 [Drains:10] Intake/Output this shift: No intake/output data recorded.  General appearance: alert, cooperative and no distress Resp: clear to auscultation bilaterally and I am making him do IS up to 2000,  GI: soft, non-tender; bowel sounds normal; no masses,  no organomegaly and port sites look fine, still some greenish fluid in JP  Lab Results:  Recent Labs    09/18/17 0817 09/19/17 0533  WBC 14.9* 15.4*  HGB 15.0 14.1  HCT 44.1 42.4  PLT 273 286    BMET Recent Labs    09/17/17 0742 09/19/17 0533  NA 136 138  K 4.0 4.0  CL 101 99*  CO2 24 29  GLUCOSE 172* 179*  BUN 6 5*  CREATININE 0.75 0.66  CALCIUM 8.4* 8.6*   PT/INR No results for input(s): LABPROT, INR in the last 72 hours.  Recent Labs  Lab 09/13/17 1822 09/15/17 0514 09/16/17 0558 09/17/17 0742 09/19/17 0533  AST 20 14* 10* 12* 11*  ALT 24 21 13* 10* 9*  ALKPHOS 74 70 64 73 76  BILITOT 0.7 1.3* 1.1 1.1 0.9  PROT 6.3* 7.0 5.5* 5.4* 5.6*  ALBUMIN 3.5 3.0* 2.5* 2.2* 2.2*     Lipase     Component Value Date/Time   LIPASE 64 (H) 09/17/2017 0742     Prior  to Admission medications   Medication Sig Start Date End Date Taking? Authorizing Provider  atorvastatin (LIPITOR) 10 MG tablet Take 1 tablet (10 mg total) by mouth daily. 11/27/16  Yes Marletta Lor, MD  ibuprofen (ADVIL,MOTRIN) 200 MG tablet Take 400 mg by mouth every 6 (six) hours as needed (for pain).   Yes [provider]  INVOKANA 300 MG TABS tablet TAKE 1 TABLET BY MOUTH DAILY BEFORE BREAKFAST. Patient taking differently: Take 300 mg by mouth once a day before breakfast 09/02/17  Yes Marletta Lor, MD  metFORMIN (GLUCOPHAGE) 1000 MG tablet TAKE 1 TABLET BY MOUTH 2 TIMES DAILY WITH A MEAL. Patient taking differently: Take 1,000 mg two times a day with meals 12/24/16  Yes Marletta Lor, MD  Dulaglutide (TRULICITY) 1.5 TF/5.7DU SOPN Inject 1.5 mg into the skin once a week. Patient not taking: Reported on 09/14/2017 12/05/16   Marletta Lor, MD  [facility] VERIO test strip USE TO TEST BLOOD SUGAR ONCE DAILY 09/28/16   Marletta Lor, MD  oxyCODONE (OXY IR/ROXICODONE) 5 MG immediate release tablet Take 1 tablet (5 mg total) by mouth every 6 (six) hours as needed for severe pain. Patient not taking: Reported on 09/14/2017 09/06/17   Wellington Hampshire, PA-C  Medications: . acetaminophen  1,000 mg Oral Q8H  . docusate sodium  100 mg Oral BID  . enoxaparin (LOVENOX) injection  40 mg Subcutaneous Q24H  . feeding supplement (GLUCERNA SHAKE)  237 mL Oral TID BM  . insulin aspart  0-20 Units Subcutaneous TID WC  . polyethylene glycol  17 g Oral Daily   . dextrose 5% lactated ringers with KCl 20 mEq/L 150 mL/hr at 09/19/17 0147   Anti-infectives (From admission, onward)   Start     Dose/Rate Route Frequency Ordered Stop   09/14/17 2100  ciprofloxacin (CIPRO) IVPB 400 mg  Status:  Discontinued     400 mg 200 mL/hr over 60 Minutes Intravenous Every 12 hours 09/14/17 2047 09/17/17 0918   09/14/17 2100  metroNIDAZOLE (FLAGYL) IVPB 500 mg  Status:  Discontinued      500 mg 100 mL/hr over 60 Minutes Intravenous Every 6 hours 09/14/17 2047 09/17/17 0918   09/14/17 0830  ciprofloxacin (CIPRO) IVPB 400 mg     400 mg 200 mL/hr over 60 Minutes Intravenous To Short Stay 09/14/17 0824 09/14/17 0952      Assessment/Plan S/P Lap chole with drain placement, 12/13, Dr. Ninfa Linden with bile leak (readmit 09/13/17) ERCP and stent placement, Dr. Carlean Purl, 12/22 Leak at cystic duct - CT after ERCP showedmore than expected gas/air s/p lap cole, bile and fluid in right gutter and GB fossa, and CT changes of pancreatitis. Air likely from ERCP - drain with10cc output in 24 h - continue drain -patient with some improvement in nausea, will advance to fulls only - colace BID, miralax daily, dulcolax suppository prn Pancreatitis- s/p ERCP - Lipaseyesterday64  Leukocytosis- still up Tachycardia- HR in the 100s,improving - will continue to monitor Glucose - 320-140 yesterday  Type II diabetes - sliding scale insulin Obesity - Body mass index is 35.   FEN: IVF; bowel regimen/full liquids VTE: SCD's, lovenox ID:IV cipro/flagyl 12/22 - 12/25; currently no abx Follow up:Dr. Ninfa Linden  Plan:  Advance to Carb Mod, diet, restart Glucophage, diabetes coordinator to assist with meds.  Lipase is pending.  He thinks he may be able to go home today, will review with DR. White. He took Dilaudid 1 mg x 6 yesterday, Oxycodone 10 mg x 5 yesterday.        LOS: 6 days    Keilly Fatula 09/19/2017 [phone]

## 2017-09-20 ENCOUNTER — Inpatient Hospital Stay (HOSPITAL_COMMUNITY): Payer: 59

## 2017-09-20 LAB — CBC
HCT: 41 % (ref 39.0–52.0)
Hemoglobin: 13.5 g/dL (ref 13.0–17.0)
MCH: 28.7 pg (ref 26.0–34.0)
MCHC: 32.9 g/dL (ref 30.0–36.0)
MCV: 87 fL (ref 78.0–100.0)
Platelets: 286 10*3/uL (ref 150–400)
RBC: 4.71 MIL/uL (ref 4.22–5.81)
RDW: 13.1 % (ref 11.5–15.5)
WBC: 17.3 10*3/uL — ABNORMAL HIGH (ref 4.0–10.5)

## 2017-09-20 LAB — GLUCOSE, CAPILLARY
Glucose-Capillary: 140 mg/dL — ABNORMAL HIGH (ref 65–99)
Glucose-Capillary: 153 mg/dL — ABNORMAL HIGH (ref 65–99)
Glucose-Capillary: 159 mg/dL — ABNORMAL HIGH (ref 65–99)
Glucose-Capillary: 137 mg/dL — ABNORMAL HIGH (ref 65–99)

## 2017-09-20 MED ORDER — CANAGLIFLOZIN 300 MG PO TABS
300.0000 mg | ORAL_TABLET | Freq: Every day | ORAL | Status: DC
  Administered 2017-09-21 – 2017-09-23 (×3): 300 mg via ORAL
  Filled 2017-09-20 (×3): qty 1

## 2017-09-20 MED ORDER — METFORMIN HCL 500 MG PO TABS
1000.0000 mg | ORAL_TABLET | Freq: Two times a day (BID) | ORAL | Status: DC
  Administered 2017-09-23: 1000 mg via ORAL
  Filled 2017-09-20: qty 2

## 2017-09-20 MED ORDER — IOPAMIDOL (ISOVUE-300) INJECTION 61%
INTRAVENOUS | Status: AC
  Administered 2017-09-20: 12:00:00
  Filled 2017-09-20: qty 30

## 2017-09-20 MED ORDER — IOPAMIDOL (ISOVUE-300) INJECTION 61%
INTRAVENOUS | Status: AC
  Administered 2017-09-20: 13:00:00
  Filled 2017-09-20: qty 100

## 2017-09-20 NOTE — Progress Notes (Signed)
6 Days Post-Op    CC:  Bile leak after laparoscopic cholecystectomy  Subjective: Tolerating diet well.  Still has pain which he attributes to the drain.  Anxious to get out of here and home.  Discouraged with news of repeat CT scan.   About 10 cc of green clear fluid in drain from yesterday. Objective: Vital signs in last 24 hours: Temp:  [98.4 F (36.9 C)-99.3 F (37.4 C)] 98.6 F (37 C) (12/28 0536) Pulse Rate:  [95-97] 95 (12/28 0536) Resp:  [16-20] 16 (12/28 0536) BP: (144-164)/(81-89) 144/84 (12/28 0536) SpO2:  [97 %-100 %] 97 % (12/28 0536) Last BM Date: 09/18/17 600 PO 2400 IV Voided x 4 Nothing from the drain recorded Afebrile, VSS, BP still up some - diastolic WBC is up to 19.5 this Am Glucose much better on Glucophage - but will have to hold for the IV contrast Tylenol 1 gm x 3 Dilaudid x 2 yesterday Oxycodone 10 mg x 5 yesterday Tramadol 50 mg x 3 yesterday Intake/Output from previous day: 12/27 0701 - 12/28 0700 In: 2995 [P.O.:600; I.V.:2395] Out: 0  Intake/Output this shift: No intake/output data recorded.  General appearance: alert, cooperative, no distress and anxious and upset about news of rise in WBC/CT scan Resp: clear to auscultation bilaterally GI: soft, sore at drain site, no other complaints.  Lab Results:  Recent Labs    09/19/17 0533 09/20/17 0503  WBC 15.4* 17.3*  HGB 14.1 13.5  HCT 42.4 41.0  PLT 286 286    BMET Recent Labs    09/17/17 0742 09/19/17 0533  NA 136 138  K 4.0 4.0  CL 101 99*  CO2 24 29  GLUCOSE 172* 179*  BUN 6 5*  CREATININE 0.75 0.66  CALCIUM 8.4* 8.6*   PT/INR No results for input(s): LABPROT, INR in the last 72 hours.  Recent Labs  Lab 09/13/17 1822 09/15/17 0514 09/16/17 0558 09/17/17 0742 09/19/17 0533  AST 20 14* 10* 12* 11*  ALT 24 21 13* 10* 9*  ALKPHOS 74 70 64 73 76  BILITOT 0.7 1.3* 1.1 1.1 0.9  PROT 6.3* 7.0 5.5* 5.4* 5.6*  ALBUMIN 3.5 3.0* 2.5* 2.2* 2.2*     Lipase      Component Value Date/Time   LIPASE 20 09/19/2017 0553     Medications: . acetaminophen  1,000 mg Oral Q8H  . docusate sodium  100 mg Oral BID  . enoxaparin (LOVENOX) injection  40 mg Subcutaneous Q24H  . feeding supplement (GLUCERNA SHAKE)  237 mL Oral TID BM  . insulin aspart  0-20 Units Subcutaneous TID WC  . metFORMIN  1,000 mg Oral BID WC  . polyethylene glycol  17 g Oral Daily    Assessment/Plan S/P Lap chole with drain placement, 12/13, Dr. Ninfa Linden with bile leak (readmit 09/13/17) ERCP and stent placement, Dr. Carlean Purl, 12/22 Leak at cystic duct - CT after ERCP showedmore than expected gas/air s/p lap cole, bile and fluid in right gutter and GB fossa, and CT changes of pancreatitis. Air likely from ERCP - drain with10cc output in 24 h - continue drain -patient withsome improvement in nausea, will advance to fulls only - colace BID, miralax daily, dulcolax suppository prn Pancreatitis- s/p ERCP - Lipaseyesterday64  Leukocytosis- still going up over the last 3 days Tachycardia- HR in the 100s,improving - will continue to monitor Glucose - 320-140 yesterday - better on Glucophage 181 high and dow to 134  Will have to hold Glucophage after IV contrast  Type  II diabetes - sliding scale insulin Obesity - Body mass index is 35.   FEN: IVF; bowel regimen/full liquids VTE: SCD's, lovenox ID:IV cipro/flagyl 12/22 - 12/25; currently no abx Follow up:Dr. Ninfa Linden   Plan:  Repeat CT, hold Glucophage for 48 hours after CT.     LOS: 7 days    Yair Dusza 09/20/2017 [phone]

## 2017-09-20 NOTE — Progress Notes (Signed)
CT scan today shows:  Persistent and slight progressive changes of acute pancreatitis but no complicating features such as pancreatic necrosis, hemorrhage or venous thrombosis.Associated inflammation of the second and third portions of the Duodenum.  Biliary stent remains in good position without complicating features. Right upper quadrant drainage catheters are stable  . dextrose 5% lactated ringers with KCl 20 mEq/L 100 mL/hr at 09/20/17 0304    Discussed with Dr. Dema Severin, will go back to clears.

## 2017-09-20 NOTE — Progress Notes (Signed)
Spoke with Will Creig Hines PA regarding starting Invokana 300 mg home dose while holding Metformin due to contrast. Will follow.  Thank you, Nani Gasser. Lateefa Crosby, RN, MSN, CDE  Diabetes Coordinator Inpatient Glycemic Control Team Team Pager [phone] (8am-5pm) 09/20/2017 9:50 AM

## 2017-09-21 LAB — COMPREHENSIVE METABOLIC PANEL
ALT: 23 U/L (ref 17–63)
AST: 28 U/L (ref 15–41)
Albumin: 2.2 g/dL — ABNORMAL LOW (ref 3.5–5.0)
Alkaline Phosphatase: 94 U/L (ref 38–126)
Anion gap: 15 (ref 5–15)
BUN: <5 mg/dL — ABNORMAL LOW (ref 6–20)
CO2: 22 mmol/L (ref 22–32)
Calcium: 8.3 mg/dL — ABNORMAL LOW (ref 8.9–10.3)
Chloride: 96 mmol/L — ABNORMAL LOW (ref 101–111)
Creatinine, Ser: 0.62 mg/dL (ref 0.61–1.24)
GFR calc Af Amer: >60 mL/min (ref >60)
GFR calc non Af Amer: >60 mL/min (ref >60)
Glucose, Bld: 151 mg/dL — ABNORMAL HIGH (ref 65–99)
Potassium: 4 mmol/L (ref 3.5–5.1)
Sodium: 133 mmol/L — ABNORMAL LOW (ref 135–145)
Total Bilirubin: 1 mg/dL (ref 0.3–1.2)
Total Protein: 5.5 g/dL — ABNORMAL LOW (ref 6.5–8.1)

## 2017-09-21 LAB — GLUCOSE, CAPILLARY
Glucose-Capillary: 158 mg/dL — ABNORMAL HIGH (ref 65–99)
Glucose-Capillary: 112 mg/dL — ABNORMAL HIGH (ref 65–99)
Glucose-Capillary: 136 mg/dL — ABNORMAL HIGH (ref 65–99)
Glucose-Capillary: 167 mg/dL — ABNORMAL HIGH (ref 65–99)

## 2017-09-21 LAB — LIPASE, BLOOD: Lipase: 21 U/L (ref 11–51)

## 2017-09-21 LAB — CBC
HCT: 39.9 % (ref 39.0–52.0)
Hemoglobin: 13.4 g/dL (ref 13.0–17.0)
MCH: 28.9 pg (ref 26.0–34.0)
MCHC: 33.6 g/dL (ref 30.0–36.0)
MCV: 86 fL (ref 78.0–100.0)
Platelets: 278 10*3/uL (ref 150–400)
RBC: 4.64 MIL/uL (ref 4.22–5.81)
RDW: 13 % (ref 11.5–15.5)
WBC: 13.9 10*3/uL — ABNORMAL HIGH (ref 4.0–10.5)

## 2017-09-21 NOTE — Progress Notes (Signed)
7 Days Post-Op    XB:MWUX leak after laparoscopic cholecystectomy     Subjective: He has about 5 ml of fluid in his drain, it's light green.  He has nausea when he is up walking, not really related to food.  Objective: Vital signs in last 24 hours: Temp:  [98.2 F (36.8 C)-99.3 F (37.4 C)] 98.5 F (36.9 C) (12/29 0503) Pulse Rate:  [93-95] 95 (12/29 0503) Resp:  [18] 18 (12/29 0503) BP: (150-164)/(77-89) 164/89 (12/29 0503) SpO2:  [95 %-98 %] 95 % (12/29 0503) Last BM Date: 09/20/17 480 PO 2200 IV Urine x 5 BM x 2 Drain 5 ml recorded Afebrile, VSS WBC down to 13.9 this AM Lipase is pending CT 12/28:  Persistent and slight progressive changes of acute pancreatitis but no complicating features such as pancreatic necrosis, hemorrhage or venous thrombosis.  Associated inflammation of the second and third portions of the duodenum.  Biliary stent remains in good position without complicating features. Right upper quadrant drainage catheters are stable. Improving/resolving bibasilar atelectasis.   Intake/Output from previous day: 12/28 0701 - 12/29 0700 In: 2698.3 [P.O.:480; I.V.:2218.3] Out: 5 [Drains:5] Intake/Output this shift: No intake/output data recorded.  General appearance: alert, cooperative, no distress and he seems rather depressed over everything, but tolerating issues well Resp: clear to auscultation bilaterally GI: his abdomen is soft, no peritonitis, sites looks good, minimal drainage from the [place].  Lab Results:  Recent Labs    09/20/17 0503 09/21/17 0617  WBC 17.3* 13.9*  HGB 13.5 13.4  HCT 41.0 39.9  PLT 286 278    BMET Recent Labs    09/19/17 0533  NA 138  K 4.0  CL 99*  CO2 29  GLUCOSE 179*  BUN 5*  CREATININE 0.66  CALCIUM 8.6*   PT/INR No results for input(s): LABPROT, INR in the last 72 hours.  Recent Labs  Lab 09/15/17 0514 09/16/17 0558 09/17/17 0742 09/19/17 0533  AST 14* 10* 12* 11*  ALT 21 13* 10* 9*  ALKPHOS 70 64  73 76  BILITOT 1.3* 1.1 1.1 0.9  PROT 7.0 5.5* 5.4* 5.6*  ALBUMIN 3.0* 2.5* 2.2* 2.2*     Lipase     Component Value Date/Time   LIPASE 20 09/19/2017 0553     Medications: . acetaminophen  1,000 mg Oral Q8H  . canagliflozin  300 mg Oral QAC breakfast  . docusate sodium  100 mg Oral BID  . enoxaparin (LOVENOX) injection  40 mg Subcutaneous Q24H  . feeding supplement (GLUCERNA SHAKE)  237 mL Oral TID BM  . insulin aspart  0-20 Units Subcutaneous TID WC  . [START ON 09/23/2017] metFORMIN  1,000 mg Oral BID WC  . polyethylene glycol  17 g Oral Daily   . dextrose 5% lactated ringers with KCl 20 mEq/L 100 mL/hr at 09/21/17 3244    Assessment/Plan S/P Lap chole with drain placement, 12/13, Dr. Christy Gentles bile leak (readmit 09/13/17) ERCP and stent placement, Dr. Carlean Purl, 12/22 Leak at cystic duct - CT after ERCP showedmore than expected gas/air s/p lap cole, bile and fluid in right gutter and GB fossa, and CT changes of pancreatitis. Air likely from ERCP - drain with10cc output in 24 h - continue drain -patient withsome improvement in nausea, will advance to fulls only - colace BID, miralax daily, dulcolax suppository prn Pancreatitis- s/p ERCP - lipase pending today CT 12/28 - Persistent and slight progressive changes of acute pancreatitis  Leukocytosis- 14.9 => 15.4 => 17.3 => 13.9 09/21/17 Tachycardia- HR  in the 100s,improving - will continue to monitor Glucose- good control now Type II diabetes - sliding scale insulin/Invokana Obesity -Body mass index is 35.   FEN: IVF; bowel regimen/full liquids VTE: SCD's, lovenox ID:IV cipro/flagyl 12/22-12/25; currently no abx Follow up:Dr. Ninfa Linden   Plan:  He is on clears, dilaudid discontinued, and taking less oxycodone.  Will discuss with Dr. Dema Severin and Hulen Skains.       LOS: 8 days    Seith Aikey 09/21/2017 [phone]

## 2017-09-22 LAB — GLUCOSE, CAPILLARY
Glucose-Capillary: 116 mg/dL — ABNORMAL HIGH (ref 65–99)
Glucose-Capillary: 119 mg/dL — ABNORMAL HIGH (ref 65–99)
Glucose-Capillary: 148 mg/dL — ABNORMAL HIGH (ref 65–99)
Glucose-Capillary: 218 mg/dL — ABNORMAL HIGH (ref 65–99)

## 2017-09-22 NOTE — Progress Notes (Signed)
8 Days Post-Op    CC:  Bile leak after laparoscopic cholecystectomy    Subjective: Pain is markedly improved today.  The only discomfort he has is right around the drain site and that is minimal.  He is tolerating the clears well and asking for more food.  He is on a clear diet.  Objective: Vital signs in last 24 hours: Temp:  [98.2 F (36.8 C)-98.5 F (36.9 C)] 98.5 F (36.9 C) (12/30 0503) Pulse Rate:  [91-104] 97 (12/30 0503) Resp:  [18] 18 (12/30 0503) BP: (146-156)/(83-87) 146/83 (12/30 0503) SpO2:  [94 %-97 %] 97 % (12/30 0503) Last BM Date: 09/21/17 840 p.o. 2300 IV Urine x5 Stool x2 Drain 5 mL Afebrile vital signs are stable blood pressure diastolic stays in the upper 80 range. Intake/Output from previous day: 12/29 0701 - 12/30 0700 In: 3141.7 [P.O.:840; I.V.:2301.7] Out: 5 [Drains:5] Intake/Output this shift: No intake/output data recorded.  General appearance: alert, cooperative and no distress Resp: clear to auscultation bilaterally GI: Soft, he is only tender around the drain site.  Minimal drainage  Lab Results:  Recent Labs    09/20/17 0503 09/21/17 0617  WBC 17.3* 13.9*  HGB 13.5 13.4  HCT 41.0 39.9  PLT 286 278    BMET Recent Labs    09/21/17 0617  NA 133*  K 4.0  CL 96*  CO2 22  GLUCOSE 151*  BUN <5*  CREATININE 0.62  CALCIUM 8.3*   PT/INR No results for input(s): LABPROT, INR in the last 72 hours.  Recent Labs  Lab 09/16/17 0558 09/17/17 0742 09/19/17 0533 09/21/17 0617  AST 10* 12* 11* 28  ALT 13* 10* 9* 23  ALKPHOS 64 73 76 94  BILITOT 1.1 1.1 0.9 1.0  PROT 5.5* 5.4* 5.6* 5.5*  ALBUMIN 2.5* 2.2* 2.2* 2.2*     Lipase     Component Value Date/Time   LIPASE 21 09/21/2017 0617     Medications: . acetaminophen  1,000 mg Oral Q8H  . canagliflozin  300 mg Oral QAC breakfast  . docusate sodium  100 mg Oral BID  . enoxaparin (LOVENOX) injection  40 mg Subcutaneous Q24H  . feeding supplement (GLUCERNA SHAKE)  237 mL  Oral TID BM  . insulin aspart  0-20 Units Subcutaneous TID WC  . [START ON 09/23/2017] metFORMIN  1,000 mg Oral BID WC  . polyethylene glycol  17 g Oral Daily    Assessment/Plan S/P Lap chole with drain placement, 12/13, Dr. Christy Gentles bile leak (readmit 09/13/17) ERCP and stent placement, Dr. Carlean Purl, 12/22 Leak at cystic duct - CT after ERCP showedmore than expected gas/air s/p lap cole, bile and fluid in right gutter and GB fossa, and CT changes of pancreatitis. Air likely from ERCP - drain with10cc output in 24 h - continue drain -patient withsome improvement in nausea, will advance to fulls only - colace BID, miralax daily, dulcolax suppository prn Pancreatitis- s/p ERCP - Lipaseyesterday64  Leukocytosis- still going up over the last 3 days Tachycardia- HR in the 100s,improving - will continue to monitor Glucose- 320-140 yesterday - better on Glucophage 181 high and dow to 134  Will have to hold Glucophage after IV contrast  Type II diabetes - sliding scale insulin Obesity -Body mass index is 35.   FEN: IVF; bowel regimen/clear liquids =>> go to full VTE: SCD's, lovenox ID:IV cipro/flagyl 12/22-12/25; currently no abx Follow up:Dr. Ninfa Linden          LOS: 9 days    Rett Stehlik 09/22/2017  [phone]  

## 2017-09-23 LAB — COMPREHENSIVE METABOLIC PANEL
ALT: 58 U/L (ref 17–63)
AST: 43 U/L — ABNORMAL HIGH (ref 15–41)
Albumin: 2.3 g/dL — ABNORMAL LOW (ref 3.5–5.0)
Alkaline Phosphatase: 93 U/L (ref 38–126)
Anion gap: 13 (ref 5–15)
BUN: 5 mg/dL — ABNORMAL LOW (ref 6–20)
CO2: 24 mmol/L (ref 22–32)
Calcium: 8.3 mg/dL — ABNORMAL LOW (ref 8.9–10.3)
Chloride: 98 mmol/L — ABNORMAL LOW (ref 101–111)
Creatinine, Ser: 0.7 mg/dL (ref 0.61–1.24)
GFR calc Af Amer: >60 mL/min (ref >60)
GFR calc non Af Amer: >60 mL/min (ref >60)
Glucose, Bld: 155 mg/dL — ABNORMAL HIGH (ref 65–99)
Potassium: 3.8 mmol/L (ref 3.5–5.1)
Sodium: 135 mmol/L (ref 135–145)
Total Bilirubin: 0.9 mg/dL (ref 0.3–1.2)
Total Protein: 5.3 g/dL — ABNORMAL LOW (ref 6.5–8.1)

## 2017-09-23 LAB — LIPASE, BLOOD: Lipase: 32 U/L (ref 11–51)

## 2017-09-23 LAB — CBC
HCT: 41 % (ref 39.0–52.0)
Hemoglobin: 13.5 g/dL (ref 13.0–17.0)
MCH: 28 pg (ref 26.0–34.0)
MCHC: 32.9 g/dL (ref 30.0–36.0)
MCV: 85.1 fL (ref 78.0–100.0)
Platelets: 345 10*3/uL (ref 150–400)
RBC: 4.82 MIL/uL (ref 4.22–5.81)
RDW: 12.9 % (ref 11.5–15.5)
WBC: 9.6 10*3/uL (ref 4.0–10.5)

## 2017-09-23 LAB — GLUCOSE, CAPILLARY: Glucose-Capillary: 141 mg/dL — ABNORMAL HIGH (ref 65–99)

## 2017-09-23 MED ORDER — OXYCODONE HCL 5 MG PO TABS: 5.0000 mg | ORAL_TABLET | ORAL | 0 refills | Status: DC | PRN

## 2017-09-23 NOTE — Progress Notes (Signed)
Pt discharged to home accompanied by wife.  Pt has supplies to care for JP drain and chart to record drainage on it.  Pt understands all of his follow up appointments.  rx for oxycodone given and explained.

## 2017-09-26 NOTE — Discharge Summary (Signed)
Physician Discharge Summary  Patient ID: Derrick West MRN: [id number] DOB/AGE: 1975/12/15 42 y.o.  Admit date: 09/13/2017 Discharge date: 09/23/17  Admission Diagnoses:   Postoperative bile leak S/P laparoscopic cholecystectomy with drain placement 09/23/2017 with bile leak readmitted 09/13/17 Dr. Mercy Riding.   Type 2 diabetes Anxiety and depression Obesity  Discharge Diagnoses:  Postoperative bile leak status post laparoscopic cholecystectomy 09/23/2017 Status post ERCP 09/14/17 Post ERCP pancreatitis Type 2 diabetes Anxiety and depression Obesity, BMI 35     Active Problems:   Bile leak   Other acute pancreatitis with uninfected necrosis   Upper abdominal pain   Leukocytosis   PROCEDURES: ERCP  with stent placement 09/14/17 Dr. Ardelle Anton Course:  Derrick West is status post laparoscopic cholecystectomy by Dr. Ninfa Linden on 09/05/17 for acute cholecystitis.  A surgical drain was left at that time.  At home, he has had right upper quadrant abdominal pain and bilious drainage from his drain.  He was seen by Dr. Ninfa Linden in the office today and diagnosed with a likely postoperative bile leak.  He was seen by gastroenterology and arrangements have been made for him to undergo ERCP tomorrow. Patient was admitted on 09/13/17.  He was seen in consultation by Dr. Billie Lade.  And then set up for ERCP the following day by Dr. Michelene Heady.  On 09/14/17 he was taken to the endoscopy suite and underwent ERCP a bile leak was found and a 5 cm 10 French stent was placed.  He tolerated the procedure well and returned to the room.  Postprocedure he had symptoms intense severe right scapular pain abdominal distention.  There was some concern for pancreatitis at that point.  A repeat CT scan was obtained by Dr. Arelia Longest and CT shows more than expected gas in the area of the lap chole bile fluid in the right gutter and the gallbladder fossa.  CT also says showed some changes of  pancreatitis.  This was managed conservatively.  It was noted that he had a leak at the cystic duct.   His abdominal pain changed in character and but slowly improved.  Pancreatitis was confirmed with lipase up to the 1367.  His symptoms slowly improved, he continued to have some intermittent and frequent nausea.  Not really vomiting.  His diet was slowly advanced. Drainage from the JP diminished.  Because of ongoing pain and some increasing leukocytosis we repeated CT scan on 09/20/17.  This showed a persistent and slightly progressive changes of acute pancreatitis but no other complicating features such as pancreatic necrosis hemorrhage or venous thrombosis.  Is also some associated duodenal inflammation the second third portions of the duodenum.  Biliary stent remained in good position.  We put the patient back to clears at this point.  We maintained him on IV hydration.  His diet was again advanced he was doing well and by 09/23/17 it was Dr. Orest Dikes opinion he was ready for discharge. At this point he was afebrile vital signs were stable.  He had good oral intake.  No fluid from his drain.  Is having bowel movements, he was tolerating a carb modified diet.  His glucoses were poorly controlled and we had medicine and the diabetes coordinator assisted [place] in managing his diabetes.   Condition on discharge: Improved CBC Latest Ref Rng & Units 09/23/2017 09/21/2017 09/20/2017  WBC 4.0 - 10.5 K/uL 9.6 13.9(H) 17.3(H)  Hemoglobin 13.0 - 17.0 g/dL 13.5 13.4 13.5  Hematocrit 39.0 - 52.0 % 41.0 39.9  41.0  Platelets 150 - 400 K/uL 345 278 286   CMP Latest Ref Rng & Units 09/23/2017 09/21/2017 09/19/2017  Glucose 65 - 99 mg/dL 155(H) 151(H) 179(H)  BUN 6 - 20 mg/dL 5(L) <5(L) 5(L)  Creatinine 0.61 - 1.24 mg/dL 0.70 0.62 0.66  Sodium 135 - 145 mmol/L 135 133(L) 138  Potassium 3.5 - 5.1 mmol/L 3.8 4.0 4.0  Chloride 101 - 111 mmol/L 98(L) 96(L) 99(L)  CO2 22 - 32 mmol/L 24 22 29   Calcium 8.9 - 10.3 mg/dL  8.3(L) 8.3(L) 8.6(L)  Total Protein 6.5 - 8.1 g/dL 5.3(L) 5.5(L) 5.6(L)  Total Bilirubin 0.3 - 1.2 mg/dL 0.9 1.0 0.9  Alkaline Phos 38 - 126 U/L 93 94 76  AST 15 - 41 U/L 43(H) 28 11(L)  ALT 17 - 63 U/L 58 23 9(L)    Condition on discharge: Improving   Disposition: 01-Home or Self Care  Discharge Instructions    Diet - low sodium heart healthy   Complete by:  As directed    Increase activity slowly   Complete by:  As directed      Allergies as of 09/23/2017      Reactions   Amoxicillin Other (See Comments)   From childhood; reaction not recalled      Medication List    STOP taking these medications   Dulaglutide 1.5 MG/0.5ML Sopn Commonly known as:  TRULICITY     TAKE these medications   atorvastatin 10 MG tablet Commonly known as:  LIPITOR Take 1 tablet (10 mg total) by mouth daily.   ibuprofen 200 MG tablet Commonly known as:  ADVIL,MOTRIN Take 400 mg by mouth every 6 (six) hours as needed (for pain).   INVOKANA 300 MG Tabs tablet Generic drug:  canagliflozin TAKE 1 TABLET BY MOUTH DAILY BEFORE BREAKFAST. What changed:  See the new instructions.   metFORMIN 1000 MG tablet Commonly known as:  GLUCOPHAGE TAKE 1 TABLET BY MOUTH 2 TIMES DAILY WITH A MEAL. What changed:  See the new instructions.   ONETOUCH VERIO test strip Generic drug:  glucose blood USE TO TEST BLOOD SUGAR ONCE DAILY   oxyCODONE 5 MG immediate release tablet Commonly known as:  Oxy IR/ROXICODONE Take 1-2 tablets (5-10 mg total) by mouth every 4 (four) hours as needed for moderate pain. What changed:    how much to take  when to take this  reasons to take this      Follow-up Information    Coralie Keens, MD. Call on 09/25/2017.   Specialty:  General Surgery Why:  Your appointment is at 8:45 AM, be at the office 30 minutes early for check in.  bring you photo ID and your insurance information. Contact information: [address] [place]   [address] [phone]        Marletta Lor, MD Follow up.   Specialty:  Internal Medicine Why:  CAll and see him in the next 1-2 weeks to review your medicines and medical issues. Contact information: [place] [address] [phone]        Gatha Mayer, MD Follow up.   Specialty:  Gastroenterology Why:  Call for follow up appointment related to Stent placement. Contact information: [address] [place] [address] [phone]           Signed: Earnstine Regal 09/26/2017, 12:08 PM

## 2017-10-07 ENCOUNTER — Other Ambulatory Visit (INDEPENDENT_AMBULATORY_CARE_PROVIDER_SITE_OTHER): Payer: 59

## 2017-10-07 ENCOUNTER — Encounter: Payer: Self-pay | Admitting: Physician Assistant

## 2017-10-07 ENCOUNTER — Ambulatory Visit: Payer: 59 | Admitting: Physician Assistant

## 2017-10-07 VITALS — BP 134/80 | HR 80 | Ht 68.0 in | Wt 224.0 lb

## 2017-10-07 DIAGNOSIS — K839 Disease of biliary tract, unspecified: Secondary | ICD-10-CM

## 2017-10-07 DIAGNOSIS — K851 Biliary acute pancreatitis without necrosis or infection: Secondary | ICD-10-CM | POA: Diagnosis not present

## 2017-10-07 LAB — HEPATIC FUNCTION PANEL
ALT: 29 U/L (ref 0–53)
AST: 15 U/L (ref 0–37)
Albumin: 4.1 g/dL (ref 3.5–5.2)
Alkaline Phosphatase: 85 U/L (ref 39–117)
Bilirubin, Direct: 0 mg/dL (ref 0.0–0.3)
Total Bilirubin: 0.4 mg/dL (ref 0.2–1.2)
Total Protein: 6.8 g/dL (ref 6.0–8.3)

## 2017-10-07 NOTE — Progress Notes (Addendum)
Subjective:    Patient ID: Derrick West, male    DOB: 01-Jan-1976, 42 y.o.   MRN: [id number]  HPI Derrick West is a pleasant 42 year old, established with Dr. Hilarie Fredrickson who was admitted to the hospital 09/13/2017, after being referred for additional bile leak. Patient underwent cholecystectomy on 09/05/2017 per Dr. Ninfa Linden and had a JP left in. He was noted to have a stone impacted in the cystic duct and a necrotic cystic duct stump. His JP drainage became bilious and he was complaining of right upper quadrant pain. After admission he underwent ERCP per Dr. Carlean Purl on 09/14/2017 with finding of a bile leak and stent was placed. Unfortunately he did develop post-ERCP pancreatitis which prolonged his hospitalization. He was discharged on 09/23/2017. His JP has since been removed about 2 weeks ago. He says he has been doing well. He still gets a little nausea occasionally about 20 minutes after eating but says this is occurring less often. He is not having any vomiting. He. occasionally will get a sharp crampy type pain in the epigastrium which is very brief period He has not had any fever or chills, bowels have been normal. Last labs were done on 09/23/2017 with T bili 0.9, alkaline phosphatase 93, ALT 58 and AST of 43, lipase normal at 32   Review of Systems Pertinent positive and negative review of systems were noted in the above HPI section.  All other review of systems was otherwise negative.  Outpatient Encounter Medications as of 10/07/2017  Medication Sig  . atorvastatin (LIPITOR) 10 MG tablet Take 1 tablet (10 mg total) by mouth daily.  Marland Kitchen ibuprofen (ADVIL,MOTRIN) 200 MG tablet Take 400 mg by mouth every 6 (six) hours as needed (for pain).  . INVOKANA 300 MG TABS tablet TAKE 1 TABLET BY MOUTH DAILY BEFORE BREAKFAST. (Patient taking differently: Take 300 mg by mouth once a day before breakfast)  . metFORMIN (GLUCOPHAGE) 1000 MG tablet TAKE 1 TABLET BY MOUTH 2 TIMES DAILY WITH A MEAL. (Patient taking  differently: Take 1,000 mg two times a day with meals)  . ondansetron (ZOFRAN) 4 MG tablet Take 4 mg by mouth every 8 (eight) hours as needed for nausea or vomiting.  Glory Rosebush VERIO test strip USE TO TEST BLOOD SUGAR ONCE DAILY  . oxyCODONE (OXY IR/ROXICODONE) 5 MG immediate release tablet Take 1-2 tablets (5-10 mg total) by mouth every 4 (four) hours as needed for moderate pain.   No facility-administered encounter medications on file as of 10/07/2017.    Allergies  Allergen Reactions  . Amoxicillin Other (See Comments)    From childhood; reaction not recalled   Patient Active Problem List   Diagnosis Date Noted  . Other acute pancreatitis with uninfected necrosis   . Upper abdominal pain   . Leukocytosis   . Bile leak 09/13/2017  . Acute cholecystitis 09/05/2017  . Acute calculous cholecystitis 09/05/2017  . Adjustment disorder with mixed anxiety and depressed mood 12/23/2014  . Neck pain 11/24/2012  . Diabetes mellitus without complication ([place]) 27/25/3664  . OBESITY 12/24/2007  . GANGLION CYST, WRIST, LEFT 07/29/2007   Social History   Socioeconomic History  . Marital status: Married    Spouse name: Not on file  . Number of children: 1  . Years of education: Not on file  . Highest education level: Not on file  Social Needs  . Financial resource strain: Not on file  . Food insecurity - worry: Not on file  . Food insecurity - inability: Not  on file  . Transportation needs - medical: Not on file  . Transportation needs - non-medical: Not on file  Occupational History  . Occupation: tech (shift leader)  Tobacco Use  . Smoking status: Never Smoker  . Smokeless tobacco: Never Used  Substance and Sexual Activity  . Alcohol use: Yes    Comment: occ  . Drug use: No  . Sexual activity: Not on file  Other Topics Concern  . Not on file  Social History Narrative  . Not on file    [name] family history includes Colon polyps in his father; Diabetes in his  mother.      Objective:    Vitals:   10/07/17 1003  BP: 134/80  Pulse: 80    Physical Exam well-developed white male in no acute distress, pleasant blood pressure 134/80 pulse 80, BMI 34.0. HEENT ;nontraumatic normocephalic EOMI PERRLA sclera anicteric, Cardiovascular ;regular rate and rhythm with S1-S2 no murmur or gallop, Pulmonary ;clear bilaterally, Abdomen ;soft, nontender nondistended bowel sounds are active no palpable mass or hepatosplenomegaly bowel sounds present, Rectal; exam not done, Extremities; no clubbing cyanosis or edema skin warm and dry, Neuropsych; mood and affect appropriate       Assessment & Plan:   #65 42 year old white male status post cholecystectomy for acute cholecystitis on 28/36/6294, complicated by a bile leak. Patient had ERCP and stent placement per Dr. Carlean Purl 09/14/2017. Course complicated by post-ERCP pancreatitis. Comes in today for follow-up and has been doing well. JP was removed about 2 weeks ago. Very occasional sharp epigastric discomfort, otherwise asymptomatic  #2 adult-onset diabetes mellitus #3 obesity  Plan; check hepatic panel today Patient will be scheduled for ERCP with stent removal with Dr. Carlean Purl on 12/10/2016. Procedure was discussed in detail with patient including risks and benefits and he is agreeable to proceed.  Amy Genia Harold PA-C 10/07/2017   Cc: Marletta Lor, MD   Addendum: Reviewed and agree with management.  Thanks to Dr. Carlean Purl for his help in caring for Mr. Laquan Beier, Lajuan Lines, MD  At first I thought he would need a repeat ERCP but since we know that the drainage stopped (he had a drain in) I think we can just pull the stent and no ERCP.  I have called him and explained this.  We need to change the case to an EGD with stent removal only.  Gatha Mayer, MD, Marval Regal

## 2017-10-07 NOTE — Patient Instructions (Signed)
Please go to the basement level to have your labs drawn.   You have been scheduled for an Endoscopic retrograde cholangiopancreatography. . Please follow written instructions given to you at your visit today. If you use inhalers (even only as needed), please bring them with you on the day of your procedure. Your physician has requested that you go to www.startemmi.com and enter the access code given to you at your visit today. This web site gives a general overview about your procedure. However, you should still follow specific instructions given to you by our office regarding your preparation for the procedure.

## 2017-10-29 DIAGNOSIS — Z0100 Encounter for examination of eyes and vision without abnormal findings: Secondary | ICD-10-CM | POA: Diagnosis not present

## 2017-11-07 ENCOUNTER — Ambulatory Visit: Payer: Self-pay | Admitting: Internal Medicine

## 2017-11-23 ENCOUNTER — Other Ambulatory Visit: Payer: Self-pay | Admitting: Internal Medicine

## 2017-11-25 NOTE — Telephone Encounter (Signed)
CPE scheduled  

## 2017-12-03 ENCOUNTER — Other Ambulatory Visit: Payer: Self-pay | Admitting: Internal Medicine

## 2017-12-05 ENCOUNTER — Other Ambulatory Visit: Payer: Self-pay

## 2017-12-05 ENCOUNTER — Encounter (HOSPITAL_COMMUNITY): Payer: Self-pay | Admitting: Emergency Medicine

## 2017-12-10 ENCOUNTER — Encounter (HOSPITAL_COMMUNITY): Payer: Self-pay | Admitting: *Deleted

## 2017-12-10 ENCOUNTER — Ambulatory Visit (HOSPITAL_COMMUNITY): Payer: 59 | Admitting: Anesthesiology

## 2017-12-10 ENCOUNTER — Ambulatory Visit (HOSPITAL_COMMUNITY)
Admission: RE | Admit: 2017-12-10 | Discharge: 2017-12-10 | Disposition: A | Discharge status: Home / Self Care | Payer: 59 | Source: Ambulatory Visit | Attending: Internal Medicine | Admitting: Internal Medicine

## 2017-12-10 ENCOUNTER — Encounter (HOSPITAL_COMMUNITY)
Admission: RE | Disposition: A | Discharge status: Home / Self Care | Payer: Self-pay | Source: Ambulatory Visit | Attending: Internal Medicine

## 2017-12-10 ENCOUNTER — Other Ambulatory Visit: Payer: Self-pay

## 2017-12-10 DIAGNOSIS — Z7984 Long term (current) use of oral hypoglycemic drugs: Secondary | ICD-10-CM | POA: Diagnosis not present

## 2017-12-10 DIAGNOSIS — E119 Type 2 diabetes mellitus without complications: Secondary | ICD-10-CM | POA: Diagnosis not present

## 2017-12-10 DIAGNOSIS — Z4659 Encounter for fitting and adjustment of other gastrointestinal appliance and device: Secondary | ICD-10-CM | POA: Insufficient documentation

## 2017-12-10 DIAGNOSIS — K839 Disease of biliary tract, unspecified: Secondary | ICD-10-CM

## 2017-12-10 HISTORY — PX: ESOPHAGOGASTRODUODENOSCOPY (EGD) WITH PROPOFOL: SHX5813

## 2017-12-10 HISTORY — PX: GASTROINTESTINAL STENT REMOVAL: SHX6384

## 2017-12-10 LAB — GLUCOSE, CAPILLARY: Glucose-Capillary: 140 mg/dL — ABNORMAL HIGH (ref 65–99)

## 2017-12-10 SURGERY — ESOPHAGOGASTRODUODENOSCOPY (EGD) WITH PROPOFOL
Anesthesia: Monitor Anesthesia Care

## 2017-12-10 MED ORDER — PROPOFOL 500 MG/50ML IV EMUL
INTRAVENOUS | Status: DC | PRN
  Administered 2017-12-10: 150 ug/kg/min via INTRAVENOUS

## 2017-12-10 MED ORDER — LACTATED RINGERS IV SOLN
INTRAVENOUS | Status: DC
  Administered 2017-12-10 (×2): via INTRAVENOUS

## 2017-12-10 MED ORDER — PROPOFOL 10 MG/ML IV BOLUS
INTRAVENOUS | Status: AC
  Filled 2017-12-10: qty 40

## 2017-12-10 MED ORDER — INDOMETHACIN 50 MG RE SUPP: 50.0000 mg | RECTAL | Status: DC

## 2017-12-10 MED ORDER — PROPOFOL 10 MG/ML IV BOLUS
INTRAVENOUS | Status: DC | PRN
  Administered 2017-12-10: 60 mg via INTRAVENOUS

## 2017-12-10 MED ORDER — SODIUM CHLORIDE 0.9 % IV SOLN: INTRAVENOUS | Status: DC

## 2017-12-10 MED ORDER — CIPROFLOXACIN IN D5W 400 MG/200ML IV SOLN: 400.0000 mg | Freq: Once | INTRAVENOUS | Status: DC

## 2017-12-10 SURGICAL SUPPLY — 14 items

## 2017-12-10 NOTE — Op Note (Signed)
[facility] Patient Name: Derrick West Procedure Date: 12/10/2017 MRN: [id number] Attending MD: Gatha Mayer , MD Date of Birth: January 03, 1976 CSN: [id number] Age: 42 Admit Type: Outpatient Procedure:                Upper GI endoscopy Indications:              Foreign body in the small bowel Providers:                Gatha Mayer, MD, Carmie End, RN, Cherylynn Ridges, Technician, Stephanie [place] CRNA Referring MD:              Medicines:                Propofol per Anesthesia, Monitored Anesthesia Care Complications:            No immediate complications. Estimated Blood Loss:     Estimated blood loss: none. Estimated blood loss:                            none. Procedure:                Pre-Anesthesia Assessment:                           - Prior to the procedure, a History and Physical                            was performed, and patient medications and                            allergies were reviewed. The patient's tolerance of                            previous anesthesia was also reviewed. The risks                            and benefits of the procedure and the sedation                            options and risks were discussed with the patient.                            All questions were answered, and informed consent                            was obtained. Prior Anticoagulants: The patient has                            taken no previous anticoagulant or antiplatelet                            agents. ASA Grade Assessment: II - A patient with  mild systemic disease. After reviewing the risks                            and benefits, the patient was deemed in                            satisfactory condition to undergo the procedure.                           After obtaining informed consent, the endoscope was                            passed under direct vision. Throughout the            procedure, the patient's blood pressure, pulse, and                            oxygen saturations were monitored continuously. The                            [id number]) scope was introduced through the                            mouth, and advanced to the second part of duodenum.                            The upper GI endoscopy was accomplished without                            difficulty. The patient tolerated the procedure                            well. Scope In: Scope Out: Findings:      A previously placed plastic stent was seen in the area of the papilla.       Stent removal was accomplished with a snare. Verification of patient       identification for the specimen was done. Estimated blood loss: none. Impression:               - Plastic stent in the duodenum. Removed. Moderate Sedation:      N/A- Per Anesthesia Care Recommendation:           - Patient has a contact number available for                            emergencies. The signs and symptoms of potential                            delayed complications were discussed with the                            patient. Return to normal activities tomorrow.                            Written discharge instructions were provided to the  patient.                           - Resume previous diet.                           - Continue present medications. Procedure Code(s):        --- Professional ---                           [phone], Esophagogastroduodenoscopy, flexible,                            transoral; with removal of foreign body(s) Diagnosis Code(s):        --- Professional ---                           Z46.59, Encounter for fitting and adjustment of                            other gastrointestinal appliance and device                           T18.3XXA, Foreign body in small intestine, initial                            encounter CPT copyright 2016 American Medical Association.  All rights reserved. The codes documented in this report are preliminary and upon coder review may  be revised to meet current compliance requirements. Gatha Mayer, MD 12/10/2017 11:23:06 AM This report has been signed electronically. Number of Addenda: 0

## 2017-12-10 NOTE — Discharge Instructions (Addendum)
° °  Stent removed - no problem.  You have gastritis - not necessarily a problem. Sometimes can have stomach upset and abdominal pain from this.  I appreciate the opportunity to care for you. Gatha Mayer, MD, FACG   YOU HAD AN ENDOSCOPIC PROCEDURE TODAY: Refer to the procedure report and other information in the discharge instructions given to you for any specific questions about what was found during the examination. If this information does not answer your questions, please call Dr. Celesta Aver office at [phone] to clarify.   YOU SHOULD EXPECT: Some feelings of bloating in the abdomen. Passage of more gas than usual. Walking can help get rid of the air that was put into your GI tract during the procedure and reduce the bloating. If you had a lower endoscopy (such as a colonoscopy or flexible sigmoidoscopy) you may notice spotting of blood in your stool or on the toilet paper. Some abdominal soreness may be present for a day or two, also.  DIET: Your first meal following the procedure should be a light meal and then it is ok to progress to your normal diet. A half-sandwich or bowl of soup is an example of a good first meal. Heavy or fried foods are harder to digest and may make you feel nauseous or bloated. Drink plenty of fluids but you should avoid alcoholic beverages for 24 hours.   ACTIVITY: Your care partner should take you home directly after the procedure. You should plan to take it easy, moving slowly for the rest of the day. You can resume normal activity the day after the procedure however YOU SHOULD NOT DRIVE, use power tools, machinery or perform tasks that involve climbing or major physical exertion for 24 hours (because of the sedation medicines used during the test).   SYMPTOMS TO REPORT IMMEDIATELY: A gastroenterologist can be reached at any hour. Please call [phone]  for any of the following symptoms:   Following upper endoscopy (EGD, EUS, ERCP, esophageal  dilation) Vomiting of blood or coffee ground material  New, significant abdominal pain  New, significant chest pain or pain under the shoulder blades  Painful or persistently difficult swallowing  New shortness of breath  Black, tarry-looking or red, bloody stools  FOLLOW UP:  If any biopsies were taken you will be contacted by phone or by letter within the next 1-3 weeks. Call [phone]  if you have not heard about the biopsies in 3 weeks.  Please also call with any specific questions about appointments or follow up tests.

## 2017-12-10 NOTE — H&P (Signed)
[facility] History and Physical   Primary Care Physician:  Marletta Lor, MD   Reason for Procedure:   remove biliary stent  Plan:    EGD, remove stent  The risks and benefits as well as alternatives of endoscopic procedure(s) have been discussed and reviewed. All questions answered. The patient agrees to proceed.   HPI: Derrick West is a 42 y.o. male s/p biliary stent and successful resolution of post op bile leak - here for removal of the stent. Doing well.   Past Medical History:  Diagnosis Date  . Anxiety and depression   . Cholecystitis   . DIABETES MELLITUS, TYPE II 12/24/2007  . Fatty liver   . GANGLION CYST, WRIST, LEFT 07/29/2007  . Headache(784.0)   . OBESITY 12/24/2007    Past Surgical History:  Procedure Laterality Date  . ANTERIOR CERVICAL DECOMP/DISCECTOMY FUSION N/A 04/15/2013   Procedure: ANTERIOR CERVICAL DECOMPRESSION/DISCECTOMY FUSION 1 LEVEL;  Surgeon: Winfield Cunas, MD;  Location: [facility];  Service: Neurosurgery;  Laterality: N/A;  Cervical Five-Six Anterior Cervical decompression with fusion plating and bonegraft  . CHOLECYSTECTOMY N/A 09/05/2017   Procedure: LAPAROSCOPIC CHOLECYSTECTOMY;  Surgeon: Coralie Keens, MD;  Location: [facility];  Service: General;  Laterality: N/A;  . ERCP N/A 09/14/2017   Procedure: ENDOSCOPIC RETROGRADE CHOLANGIOPANCREATOGRAPHY (ERCP);  Surgeon: Gatha Mayer, MD;  Location: [facility];  Service: Endoscopy;  Laterality: N/A;  . LUMBAR LAMINECTOMY    . VSD REPAIR     age 89    Prior to Admission medications   Medication Sig Start Date End Date Taking? Authorizing Provider  atorvastatin (LIPITOR) 10 MG tablet TAKE 1 TABLET (10 MG TOTAL) BY MOUTH DAILY. 11/25/17  Yes Marletta Lor, MD  ibuprofen (ADVIL,MOTRIN) 200 MG tablet Take 400 mg by mouth every 6 (six) hours as needed (for pain).   Yes [provider]  INVOKANA 300 MG TABS tablet TAKE 1 TABLET BY MOUTH DAILY BEFORE BREAKFAST.  12/03/17  Yes Marletta Lor, MD  metFORMIN (GLUCOPHAGE) 1000 MG tablet TAKE 1 TABLET BY MOUTH 2 TIMES DAILY WITH A MEAL. Patient taking differently: Take 1,000 mg two times a day with meals 12/24/16  Yes Marletta Lor, MD  [facility] VERIO test strip USE TO TEST BLOOD SUGAR ONCE DAILY 09/28/16   Marletta Lor, MD    Current Facility-Administered Medications  Medication Dose Route Frequency Provider Last Rate Last Dose  . ciprofloxacin (CIPRO) IVPB 400 mg  400 mg Intravenous Once Esterwood, Amy S, PA-C      . indomethacin (INDOCIN) 50 MG suppository 50 mg  50 mg Rectal UD Esterwood, Amy S, PA-C      . lactated ringers infusion   Intravenous Continuous Gatha Mayer, MD 20 mL/hr at 12/10/17 1003      Allergies as of 10/07/2017 - Review Complete 10/07/2017  Allergen Reaction Noted  . Amoxicillin Other (See Comments)     Family History  Problem Relation Age of Onset  . Diabetes Mother   . Colon polyps Father     Social History   Socioeconomic History  . Marital status: Married    Spouse name: Not on file  . Number of children: 1  . Years of education: Not on file  . Highest education level: Not on file  Social Needs  . Financial resource strain: Not on file  . Food insecurity - worry: Not on file  . Food insecurity - inability: Not on file  . Transportation needs -  medical: Not on file  . Transportation needs - non-medical: Not on file  Occupational History  . Occupation: tech (shift leader)  Tobacco Use  . Smoking status: Never Smoker  . Smokeless tobacco: Never Used  Substance and Sexual Activity  . Alcohol use: Yes    Comment: occ  . Drug use: No  . Sexual activity: Not on file  Other Topics Concern  . Not on file  Social History Narrative  . Not on file    Review of Systems:  All other review of systems negative except as mentioned in the HPI.  Physical Exam: Vital signs in last 24 hours: Temp:  [98.1 F (36.7 C)] 98.1 F (36.7 C) (03/19  0941) Pulse Rate:  [77] 77 (03/19 0941) Resp:  [20] 20 (03/19 0941) BP: (143)/(84) 143/84 (03/19 0941) SpO2:  [96 %] 96 % (03/19 0941) Weight:  [227 lb (103 kg)] 227 lb (103 kg) (03/19 0941)   General:   Alert,  Well-developed, well-nourished, pleasant and cooperative in NAD Lungs:  Clear throughout to auscultation.   Heart:  Regular rate and rhythm; no murmurs, clicks, rubs,  or gallops. Abdomen:  Soft, nontender and nondistended. Normal bowel sounds.   Neuro/Psych:  Alert and cooperative. Normal mood and affect. A and O x 3   @Carl  Simonne Maffucci, MD, [facility] [phone] (pager) 12/10/2017 10:40 AM@

## 2017-12-10 NOTE — Transfer of Care (Signed)
Immediate Anesthesia Transfer of Care Note  Patient: Derrick West  Procedure(s) Performed: ESOPHAGOGASTRODUODENOSCOPY (EGD) WITH PROPOFOL (N/A ) GASTROINTESTINAL STENT REMOVAL (N/A )  Patient Location: PACU  Anesthesia Type:MAC  Level of Consciousness: awake, alert  and oriented  Airway & Oxygen Therapy: Patient Spontanous Breathing  Post-op Assessment: Report given to RN and Post -op Vital signs reviewed and stable  Post vital signs: Reviewed and stable  Last Vitals:  Vitals:   12/10/17 0941 12/10/17 1113  BP: (!) 143/84 (!) 141/92  Pulse: 77 82  Resp: 20 10  Temp: 36.7 C 36.4 C  SpO2: 96% 98%    Last Pain:  Vitals:   12/10/17 1113  TempSrc: Oral         Complications: No apparent anesthesia complications

## 2017-12-10 NOTE — Anesthesia Preprocedure Evaluation (Addendum)
Anesthesia Evaluation  Patient identified by MRN, date of birth, ID band Patient awake    Reviewed: Allergy & Precautions, NPO status , Patient's Chart, lab work & pertinent test results  Airway Mallampati: II  TM Distance: >3 FB Neck ROM: Full    Dental   Pulmonary    Pulmonary exam normal        Cardiovascular Normal cardiovascular exam  VSD repair as a child. No issues related. Normal effort tolerance.   Neuro/Psych Anxiety    GI/Hepatic   Endo/Other  diabetes, Type 2, Oral Hypoglycemic Agents  Renal/GU      Musculoskeletal   Abdominal   Peds  Hematology   Anesthesia Other Findings   Reproductive/Obstetrics                            Anesthesia Physical Anesthesia Plan  ASA: II  Anesthesia Plan: MAC   Post-op Pain Management:    Induction: Intravenous  PONV Risk Score and Plan: 1  Airway Management Planned: Simple Face Mask  Additional Equipment:   Intra-op Plan:   Post-operative Plan:   Informed Consent: I have reviewed the patients History and Physical, chart, labs and discussed the procedure including the risks, benefits and alternatives for the proposed anesthesia with the patient or authorized representative who has indicated his/her understanding and acceptance.     Plan Discussed with: CRNA and Surgeon  Anesthesia Plan Comments:         Anesthesia Quick Evaluation

## 2017-12-10 NOTE — Anesthesia Postprocedure Evaluation (Signed)
Anesthesia Post Note  Patient: Derrick West  Procedure(s) Performed: ESOPHAGOGASTRODUODENOSCOPY (EGD) WITH PROPOFOL (N/A ) GASTROINTESTINAL STENT REMOVAL (N/A )     Patient location during evaluation: PACU Anesthesia Type: MAC Level of consciousness: awake and alert Pain management: pain level controlled Vital Signs Assessment: post-procedure vital signs reviewed and stable Respiratory status: spontaneous breathing, nonlabored ventilation, respiratory function stable and patient connected to nasal cannula oxygen Cardiovascular status: stable and blood pressure returned to baseline Postop Assessment: no apparent nausea or vomiting Anesthetic complications: no    Last Vitals:  Vitals:   12/10/17 1120 12/10/17 1130  BP: 139/88 129/89  Pulse: 70 67  Resp: (!) 22 20  Temp:    SpO2: 97% 99%    Last Pain:  Vitals:   12/10/17 1113  TempSrc: Oral                 Bryar Rennie DAVID

## 2017-12-11 ENCOUNTER — Encounter (HOSPITAL_COMMUNITY): Payer: Self-pay | Admitting: Internal Medicine

## 2017-12-31 ENCOUNTER — Ambulatory Visit (INDEPENDENT_AMBULATORY_CARE_PROVIDER_SITE_OTHER): Payer: 59 | Admitting: Internal Medicine

## 2017-12-31 ENCOUNTER — Encounter: Payer: Self-pay | Admitting: Internal Medicine

## 2017-12-31 ENCOUNTER — Other Ambulatory Visit: Payer: Self-pay | Admitting: Internal Medicine

## 2017-12-31 VITALS — BP 120/80 | HR 87 | Temp 98.0°F | Ht 67.5 in | Wt 230.0 lb

## 2017-12-31 DIAGNOSIS — Z0000 Encounter for general adult medical examination without abnormal findings: Secondary | ICD-10-CM | POA: Diagnosis not present

## 2017-12-31 DIAGNOSIS — E119 Type 2 diabetes mellitus without complications: Secondary | ICD-10-CM

## 2017-12-31 LAB — POCT GLYCOSYLATED HEMOGLOBIN (HGB A1C): Hemoglobin A1C: 5.9

## 2017-12-31 NOTE — Patient Instructions (Signed)
Please check your hemoglobin A1c every 3 months  You need to lose weight.  Consider a lower calorie diet and regular exercise.    It is important that you exercise regularly, at least 20 minutes 3 to 4 times per week.  If you develop chest pain or shortness of breath seek  medical attention. 

## 2017-12-31 NOTE — Progress Notes (Signed)
Subjective:    Patient ID: Derrick West, male    DOB: 1976-02-10, 42 y.o.   MRN: [id number]  HPI 42 year old patient who is seen today for a preventive health examination. The patient was admitted to the hospital on September 05, 2017 with acute cholecystitis.  Which was treated with a cholecystectomy.  The patient was readmitted to the hospital on September 13, 2017 with a bile leak and had a drain placement.  This hospital admission was complicated by post ERCP pancreatitis.  The patient underwent EGD and removal of the biliary stent on December 10, 2017. He has type 2 diabetes it previously had been very well controlled.  Prior medicine has included Trulicity which has been held due to his history of pancreatitis.  Hemoglobin A1c today 7.4;   Family history Father age 95 he is maintained on metformin and Invokana  In good health.  Mother age 43 with diabetes 2 brothers are in good health  Past Medical History:  Diagnosis Date  . Anxiety and depression   . Cholecystitis   . DIABETES MELLITUS, TYPE II 12/24/2007  . Fatty liver   . GANGLION CYST, WRIST, LEFT 07/29/2007  . Headache(784.0)   . OBESITY 12/24/2007     Social History   Socioeconomic History  . Marital status: Married    Spouse name: Not on file  . Number of children: 1  . Years of education: Not on file  . Highest education level: Not on file  Occupational History  . Occupation: tech (shift leader)  Social Needs  . Financial resource strain: Not on file  . Food insecurity:    Worry: Not on file    Inability: Not on file  . Transportation needs:    Medical: Not on file    Non-medical: Not on file  Tobacco Use  . Smoking status: Never Smoker  . Smokeless tobacco: Never Used  Substance and Sexual Activity  . Alcohol use: Yes    Comment: occ  . Drug use: No  . Sexual activity: Not on file  Lifestyle  . Physical activity:    Days per week: Not on file    Minutes per session: Not on file  . Stress: Not on file    Relationships  . Social connections:    Talks on phone: Not on file    Gets together: Not on file    Attends religious service: Not on file    Active member of club or organization: Not on file    Attends meetings of clubs or organizations: Not on file    Relationship status: Not on file  . Intimate partner violence:    Fear of current or ex partner: Not on file    Emotionally abused: Not on file    Physically abused: Not on file    Forced sexual activity: Not on file  Other Topics Concern  . Not on file  Social History Narrative  . Not on file    Past Surgical History:  Procedure Laterality Date  . ANTERIOR CERVICAL DECOMP/DISCECTOMY FUSION N/A 04/15/2013   Procedure: ANTERIOR CERVICAL DECOMPRESSION/DISCECTOMY FUSION 1 LEVEL;  Surgeon: Winfield Cunas, MD;  Location: [place] [facility];  Service: Neurosurgery;  Laterality: N/A;  Cervical Five-Six Anterior Cervical decompression with fusion plating and bonegraft  . CHOLECYSTECTOMY N/A 09/05/2017   Procedure: LAPAROSCOPIC CHOLECYSTECTOMY;  Surgeon: Coralie Keens, MD;  Location: [place];  Service: General;  Laterality: N/A;  . ERCP N/A 09/14/2017   Procedure: ENDOSCOPIC RETROGRADE CHOLANGIOPANCREATOGRAPHY (ERCP);  Surgeon: Gatha Mayer, MD;  Location: [facility] END[facility]COPY;  Service: Endoscopy;  Laterality: N/A;  . ESOPHAGOGASTRODUODENOSCOPY (EGD) WITH PROPOFOL N/A 12/10/2017   Procedure: ESOPHAGOGASTRODUODENOSCOPY (EGD) WITH PROPOFOL;  Surgeon: Gatha Mayer, MD;  Location: [facility] ENDOSCOPY;  Service: Endoscopy;  Laterality: N/A;  . GASTROINTESTINAL STENT REMOVAL N/A 12/10/2017   Procedure: GASTROINTESTINAL STENT REMOVAL;  Surgeon: Gatha Mayer, MD;  Location: [facility] ENDOSCOPY;  Service: Endoscopy;  Laterality: N/A;  . LUMBAR LAMINECTOMY    . VSD REPAIR     age 24    Family History  Problem Relation Age of Onset  . Diabetes Mother   . Colon polyps Father     Allergies  Allergen Reactions  . Amoxicillin Other (See Comments)    From  childhood; reaction not recalled    Current Outpatient Medications on File Prior to Visit  Medication Sig Dispense Refill  . atorvastatin (LIPITOR) 10 MG tablet TAKE 1 TABLET (10 MG TOTAL) BY MOUTH DAILY. 90 tablet 3  . ibuprofen (ADVIL,MOTRIN) 200 MG tablet Take 400 mg by mouth every 6 (six) hours as needed (for pain).    . INVOKANA 300 MG TABS tablet TAKE 1 TABLET BY MOUTH DAILY BEFORE BREAKFAST. 30 tablet 0  . metFORMIN (GLUCOPHAGE) 1000 MG tablet TAKE 1 TABLET BY MOUTH 2 TIMES DAILY WITH A MEAL. (Patient taking differently: Take 1,000 mg two times a day with meals) 180 tablet 3  . ONETOUCH VERIO test strip USE TO TEST BLOOD SUGAR ONCE DAILY 100 each 4   No current facility-administered medications on file prior to visit.     BP 120/80 (BP Location: Right Arm, Patient Position: Sitting, Cuff Size: Large)   Pulse 87   Temp 98 F (36.7 C) (Oral)   Ht 5' 7.5" (1.715 m)   Wt 230 lb (104.3 kg)   SpO2 96%   BMI 35.49 kg/m      Review of Systems  Constitutional: Negative for appetite change, chills, fatigue and fever.  HENT: Negative for congestion, dental problem, ear pain, hearing loss, sore throat, tinnitus, trouble swallowing and voice change.   Eyes: Negative for pain, discharge and visual disturbance.  Respiratory: Negative for cough, chest tightness, wheezing and stridor.   Cardiovascular: Negative for chest pain, palpitations and leg swelling.  Gastrointestinal: Negative for abdominal distention, abdominal pain, blood in stool, constipation, diarrhea, nausea and vomiting.  Genitourinary: Negative for difficulty urinating, discharge, flank pain, genital sores, hematuria and urgency.  Musculoskeletal: Negative for arthralgias, back pain, gait problem, joint swelling, myalgias and neck stiffness.  Skin: Negative for rash.  Neurological: Negative for dizziness, syncope, speech difficulty, weakness, numbness and headaches.  Hematological: Negative for adenopathy. Does not  bruise/bleed easily.  Psychiatric/Behavioral: Negative for behavioral problems and dysphoric mood. The patient is not nervous/anxious.        Objective:   Physical Exam  Constitutional: He appears well-developed and well-nourished.  Weight 230 Blood pressure well controlled  HENT:  Head: Normocephalic and atraumatic.  Right Ear: External ear normal.  Left Ear: External ear normal.  Nose: Nose normal.  Mouth/Throat: Oropharynx is clear and moist.  Eyes: Pupils are equal, round, and reactive to light. Conjunctivae and EOM are normal. No scleral icterus.  Neck: Normal range of motion. Neck supple. No JVD present. No thyromegaly present.  Cardiovascular: Regular rhythm, normal heart sounds and intact distal pulses. Exam reveals no gallop and no friction rub.  No murmur heard. Pulmonary/Chest: Effort normal and breath sounds normal. He exhibits no tenderness.  Abdominal: Soft.  Bowel sounds are normal. He exhibits no distension and no mass. There is no tenderness.  Genitourinary: Penis normal.  Musculoskeletal: Normal range of motion. He exhibits no edema or tenderness.  Lymphadenopathy:    He has no cervical adenopathy.  Neurological: He is alert. He has normal reflexes. No cranial nerve deficit. Coordination normal.  Skin: Skin is warm and dry. No rash noted.  Psychiatric: He has a normal mood and affect. His behavior is normal.          Assessment & Plan:   Preventive health examination Diabetes mellitus.  Will continue metformin and Invokana.  Will continue efforts at weight loss and better diet.  Will reassess in 3 months and recheck hemoglobin A1c at that time.  Patient has a history of post ERCP pancreatitis and do not feel that this history is a contraindication for GLP-1 agonist therapy.  Will discuss in more detail if additional medication is needed in the future Continue statin therapy Mild obesity.  Continue efforts at weight loss  Follow-up 3 months with hemoglobin  A1c  Nyoka Cowden

## 2018-01-01 LAB — LIPID PANEL
Cholesterol: 206 mg/dL — ABNORMAL HIGH (ref 0–200)
HDL: 35.4 mg/dL — ABNORMAL LOW (ref >39.00)
NonHDL: 170.59
Total CHOL/HDL Ratio: 6
Triglycerides: 231 mg/dL — ABNORMAL HIGH (ref 0.0–149.0)
VLDL: 46.2 mg/dL — ABNORMAL HIGH (ref 0.0–40.0)

## 2018-01-01 LAB — LDL CHOLESTEROL, DIRECT: Direct LDL: 131 mg/dL

## 2018-01-01 LAB — TSH: TSH: 2.53 u[IU]/mL (ref 0.35–4.50)

## 2018-01-02 ENCOUNTER — Encounter: Payer: Self-pay | Admitting: Internal Medicine

## 2018-01-04 ENCOUNTER — Other Ambulatory Visit: Payer: Self-pay | Admitting: Internal Medicine

## 2018-02-11 IMAGING — US US ABDOMEN LIMITED
1 series · 14 of 25 positions shown · non-contrast
Comparison: Abdominal and pelvic CT scan of today's date

CLINICAL DATA: Epigastric pain

EXAM:
ULTRASOUND ABDOMEN LIMITED RIGHT UPPER QUADRANT

[Series 1: us abdomen limited · 0.26mm/px · 14 of 43 slices shown]
[im 1/43]
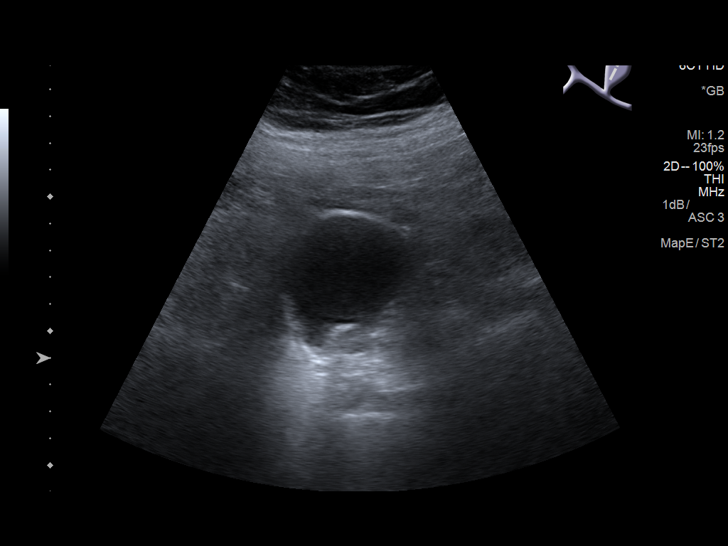
[im 4/43]
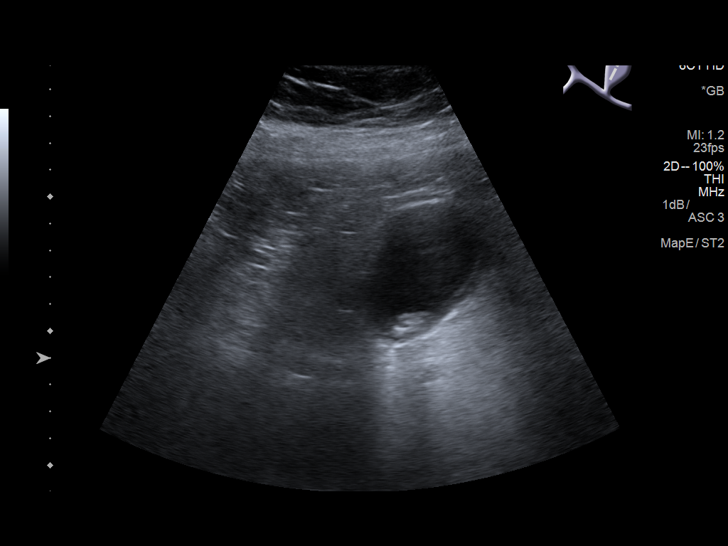
[im 8/43]
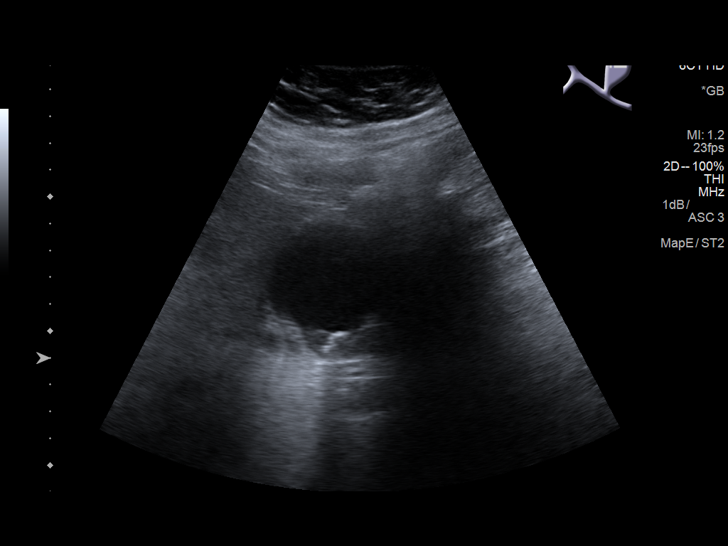
[im 11/43]
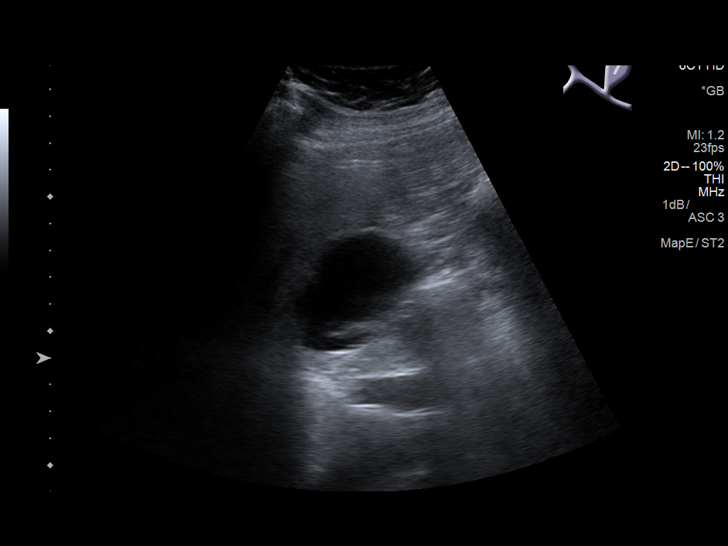
[im 15/43]
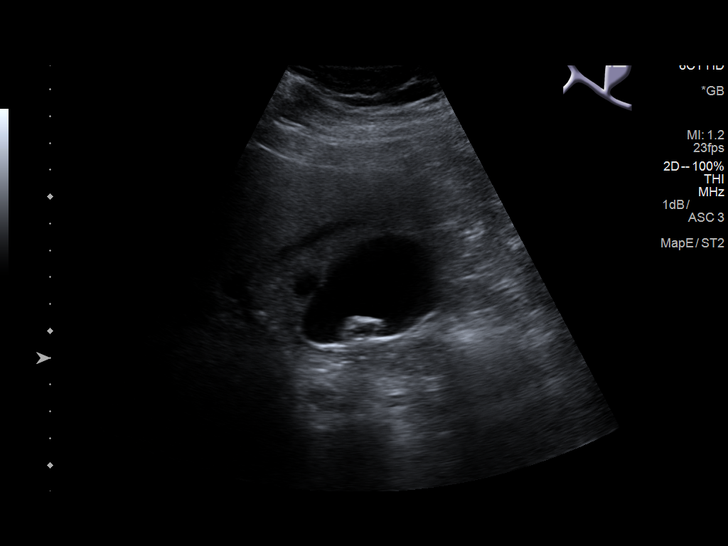
[im 16/43]
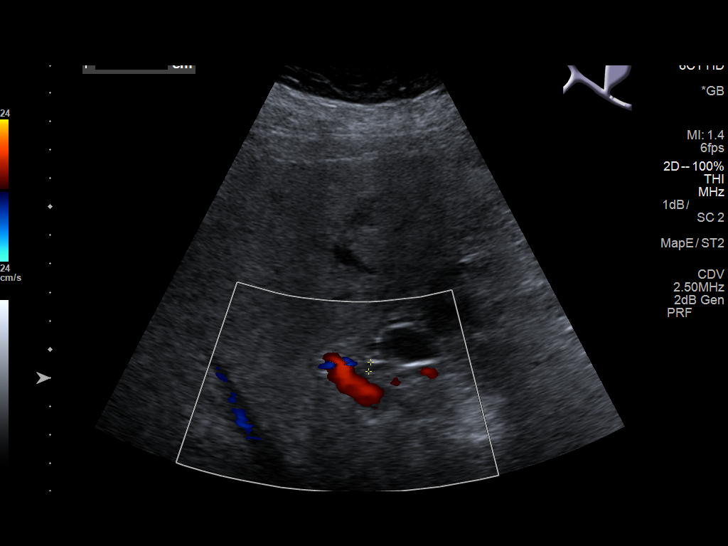
[im 20/43]
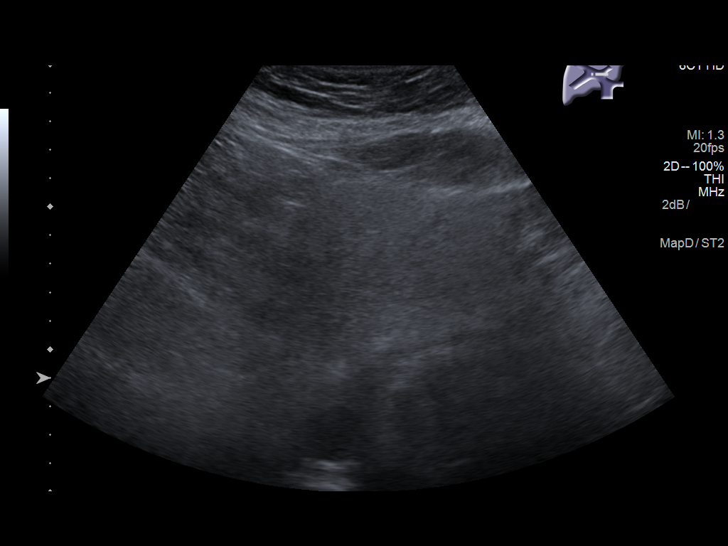
[im 23/43]
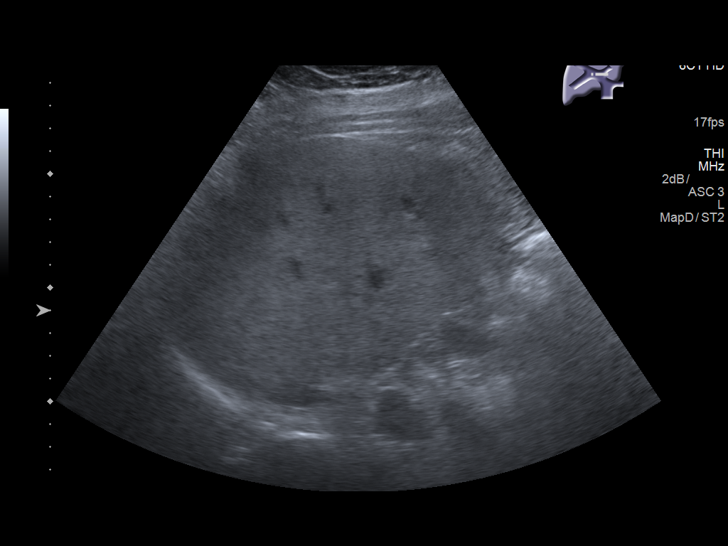
[im 27/43]
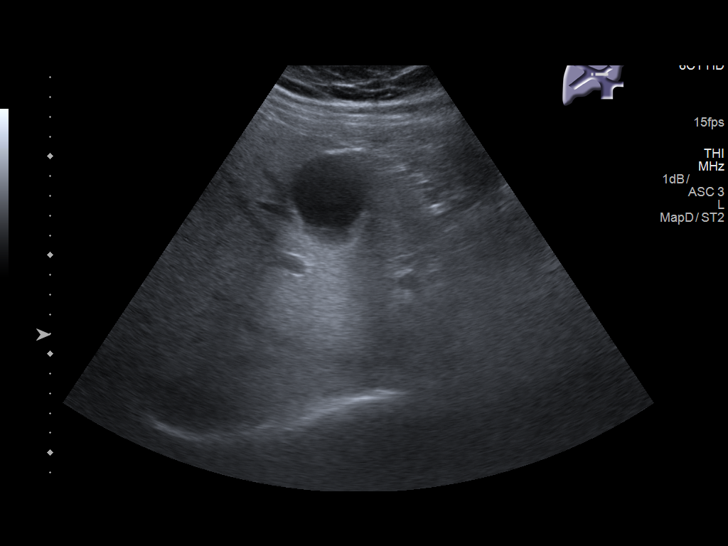
[im 29/43]
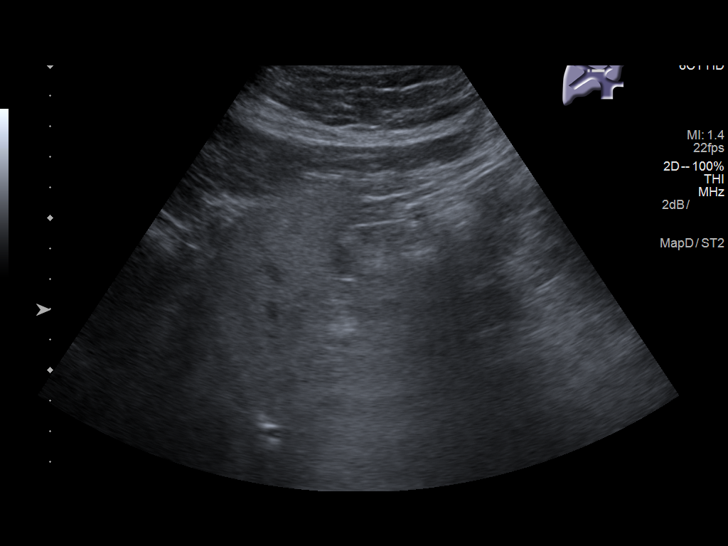
[im 32/43]
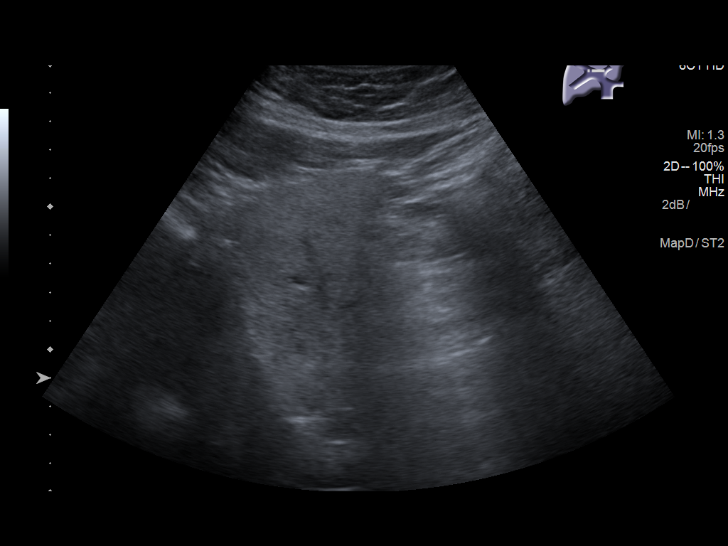
[im 36/43]
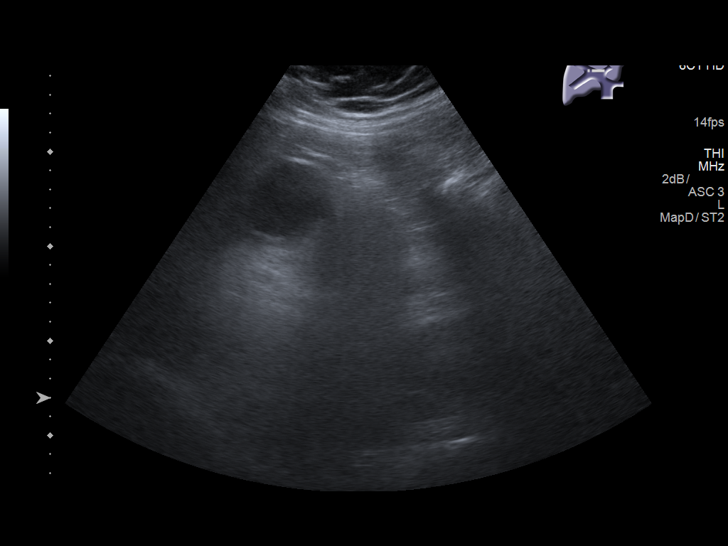
[im 39/43]
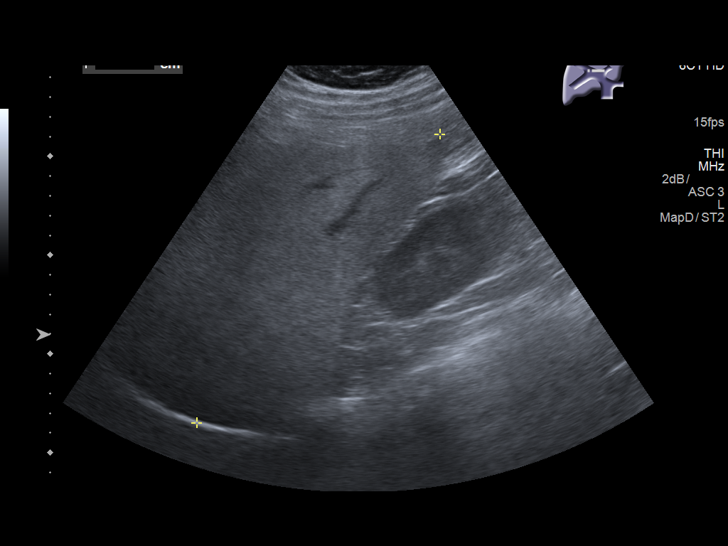
[im 43/43]
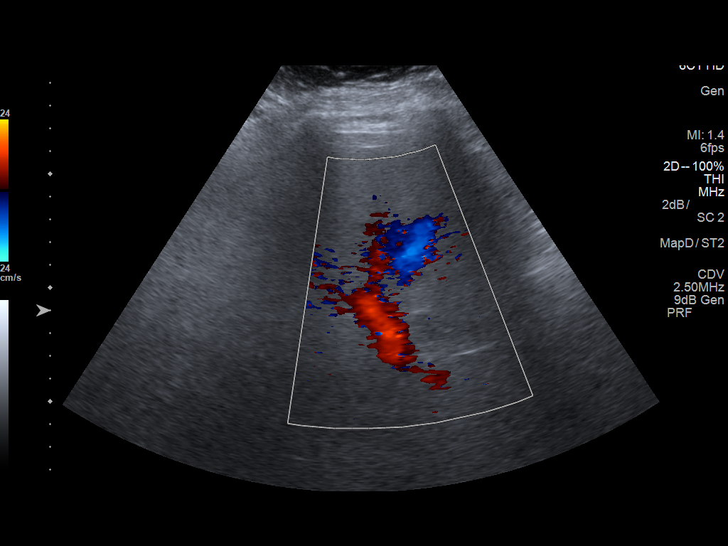

[14 of 25 positions shown; findings below may reference images not displayed]

FINDINGS: Gallbladder:

The gallbladder is adequately distended. There is an echogenic
mobile shadowing stone measuring 1.7 mm in diameter. There is
borderline gallbladder wall thickening. There is a positive
sonographic Murphy's sign and there may be a small amount of
pericholecystic fluid.

Common bile duct:

Diameter: 3.3 mm

Liver:

The hepatic echotexture is increased. The surface contour is smooth.
There is no discrete mass or ductal dilation. Portal vein is patent
on color Doppler imaging with normal direction of blood flow towards
the liver.
IMPRESSION: Gallstones with sonographic evidence of acute cholecystitis.

Increased hepatic echotexture most compatible with fatty
infiltrative change.

## 2018-04-01 ENCOUNTER — Ambulatory Visit: Payer: 59 | Admitting: Internal Medicine

## 2018-04-01 ENCOUNTER — Encounter: Payer: Self-pay | Admitting: Internal Medicine

## 2018-04-01 VITALS — BP 102/62 | HR 68 | Temp 98.1°F | Wt 200.0 lb

## 2018-04-01 DIAGNOSIS — E785 Hyperlipidemia, unspecified: Secondary | ICD-10-CM | POA: Diagnosis not present

## 2018-04-01 DIAGNOSIS — E119 Type 2 diabetes mellitus without complications: Secondary | ICD-10-CM | POA: Diagnosis not present

## 2018-04-01 LAB — POCT GLYCOSYLATED HEMOGLOBIN (HGB A1C): Hemoglobin A1C: 5.4 % (ref 4.0–5.6)

## 2018-04-01 MED ORDER — METFORMIN HCL 1000 MG PO TABS: ORAL_TABLET | ORAL | 4 refills | Status: DC

## 2018-04-01 MED ORDER — TRIAMCINOLONE ACETONIDE 0.1 % EX CREA: 1.0000 "application " | TOPICAL_CREAM | Freq: Two times a day (BID) | CUTANEOUS | 0 refills | Status: DC

## 2018-04-01 MED ORDER — ATORVASTATIN CALCIUM 10 MG PO TABS: 10.0000 mg | ORAL_TABLET | Freq: Every day | ORAL | 3 refills | Status: DC

## 2018-04-01 NOTE — Patient Instructions (Addendum)
Limit your sodium (Salt) intake   Please check your hemoglobin A1c every 3-6  Months    It is important that you exercise regularly, at least 20 minutes 3 to 4 times per week.  If you develop chest pain or shortness of breath seek  medical attention.     

## 2018-04-01 NOTE — Progress Notes (Signed)
Subjective:    Patient ID: Derrick West, male    DOB: [birth year], 42 y.o.   MRN: [id number]  HPI  Lab Results  Component Value Date   HGBA1C 5.9 12/31/2017   42 year old patient who has an approximate 10-year history of type 2 diabetes. He has been out of Invokana for about 3 weeks.  Fasting blood sugars remain well controlled in the 80s.  Hemoglobin A1c today 5.4 Remains on low intensity statin therapy  He has been on Trulicity in the past which was discontinued due to an episode of ERCP associated pancreatitis  Wt Readings from Last 3 Encounters:  04/01/18 200 lb (90.7 kg)  12/31/17 230 lb (104.3 kg)  12/10/17 227 lb (103 kg)   Over the past 3 months he has had a voluntary 30 pound weight loss Eye examination in January of this year  Past Medical History:  Diagnosis Date  . Anxiety and depression   . Cholecystitis   . DIABETES MELLITUS, TYPE II 12/24/2007  . Fatty liver   . GANGLION CYST, WRIST, LEFT 07/29/2007  . Headache(784.0)   . OBESITY 12/24/2007     Social History   Socioeconomic History  . Marital status: Married    Spouse name: Not on file  . Number of children: 1  . Years of education: Not on file  . Highest education level: Not on file  Occupational History  . Occupation: tech (shift leader)  Social Needs  . Financial resource strain: Not on file  . Food insecurity:    Worry: Not on file    Inability: Not on file  . Transportation needs:    Medical: Not on file    Non-medical: Not on file  Tobacco Use  . Smoking status: Never Smoker  . Smokeless tobacco: Never Used  Substance and Sexual Activity  . Alcohol use: Yes    Comment: occ  . Drug use: No  . Sexual activity: Not on file  Lifestyle  . Physical activity:    Days per week: Not on file    Minutes per session: Not on file  . Stress: Not on file  Relationships  . Social connections:    Talks on phone: Not on file    Gets together: Not on file    Attends religious service: Not on file      Active member of club or organization: Not on file    Attends meetings of clubs or organizations: Not on file    Relationship status: Not on file  . Intimate partner violence:    Fear of current or ex partner: Not on file    Emotionally abused: Not on file    Physically abused: Not on file    Forced sexual activity: Not on file  Other Topics Concern  . Not on file  Social History Narrative  . Not on file    Past Surgical History:  Procedure Laterality Date  . ANTERIOR CERVICAL DECOMP/DISCECTOMY FUSION N/A 04/15/2013   Procedure: ANTERIOR CERVICAL DECOMPRESSION/DISCECTOMY FUSION 1 LEVEL;  Surgeon: Winfield Cunas, MD;  Location: [place] [facility];  Service: Neurosurgery;  Laterality: N/A;  Cervical Five-Six Anterior Cervical decompression with fusion plating and bonegraft  . CHOLECYSTECTOMY N/A 09/05/2017   Procedure: LAPAROSCOPIC CHOLECYSTECTOMY;  Surgeon: Coralie Keens, MD;  Location: [place];  Service: General;  Laterality: N/A;  . ERCP N/A 09/14/2017   Procedure: ENDOSCOPIC RETROGRADE CHOLANGIOPANCREATOGRAPHY (ERCP);  Surgeon: Gatha Mayer, MD;  Location: [facility] END[facility];  Service: Endoscopy;  Laterality: N/A;  .  ESOPHAGOGASTRODUODENOSCOPY (EGD) WITH PROPOFOL N/A 12/10/2017   Procedure: ESOPHAGOGASTRODUODENOSCOPY (EGD) WITH PROPOFOL;  Surgeon: Gatha Mayer, MD;  Location: [facility]OS[facility];  Service: Endoscopy;  Laterality: N/A;  . GASTROINTESTINAL STENT REMOVAL N/A 12/10/2017   Procedure: GASTROINTESTINAL STENT REMOVAL;  Surgeon: Gatha Mayer, MD;  Location: [facility] ENDOS[facility];  Service: Endoscopy;  Laterality: N/A;  . LUMBAR LAMINECTOMY    . VSD REPAIR     age 89    Family History  Problem Relation Age of Onset  . Diabetes Mother   . Colon polyps Father     Allergies  Allergen Reactions  . Amoxicillin Other (See Comments)    From childhood; reaction not recalled    Current Outpatient Medications on File Prior to Visit  Medication Sig Dispense Refill  . atorvastatin (LIPITOR)  10 MG tablet TAKE 1 TABLET (10 MG TOTAL) BY MOUTH DAILY. 90 tablet 3  . ibuprofen (ADVIL,MOTRIN) 200 MG tablet Take 400 mg by mouth every 6 (six) hours as needed (for pain).    . INVOKANA 300 MG TABS tablet TAKE 1 TABLET BY MOUTH EVERY DAY BEFORE BREAKFAST 30 tablet 0  . metFORMIN (GLUCOPHAGE) 1000 MG tablet TAKE 1 TABLET BY MOUTH 2 TIMES DAILY WITH A MEAL. 180 tablet 1  . ONETOUCH VERIO test strip USE TO TEST BLOOD SUGAR ONCE DAILY 100 each 4   No current facility-administered medications on file prior to visit.     BP 102/62 (BP Location: Right Arm, Patient Position: Sitting, Cuff Size: Large)   Pulse 68   Temp 98.1 F (36.7 C) (Oral)   Wt 200 lb (90.7 kg)   SpO2 97%   BMI 30.86 kg/m      Review of Systems  Constitutional: Negative for appetite change, chills, fatigue and fever.  HENT: Negative for congestion, dental problem, ear pain, hearing loss, sore throat, tinnitus, trouble swallowing and voice change.   Eyes: Negative for pain, discharge and visual disturbance.  Respiratory: Negative for cough, chest tightness, wheezing and stridor.   Cardiovascular: Negative for chest pain, palpitations and leg swelling.  Gastrointestinal: Negative for abdominal distention, abdominal pain, blood in stool, constipation, diarrhea, nausea and vomiting.  Genitourinary: Negative for difficulty urinating, discharge, flank pain, genital sores, hematuria and urgency.  Musculoskeletal: Negative for arthralgias, back pain, gait problem, joint swelling, myalgias and neck stiffness.  Skin: Positive for rash.  Neurological: Negative for dizziness, syncope, speech difficulty, weakness, numbness and headaches.  Hematological: Negative for adenopathy. Does not bruise/bleed easily.  Psychiatric/Behavioral: Negative for behavioral problems and dysphoric mood. The patient is not nervous/anxious.        Objective:   Physical Exam  Constitutional: He is oriented to person, place, and time. He appears  well-developed.  HENT:  Head: Normocephalic.  Right Ear: External ear normal.  Left Ear: External ear normal.  Eyes: Conjunctivae and EOM are normal.  Neck: Normal range of motion.  Cardiovascular: Normal rate and normal heart sounds.  Pulmonary/Chest: Breath sounds normal.  Abdominal: Bowel sounds are normal.  Musculoskeletal: Normal range of motion. He exhibits no edema or tenderness.  Neurological: He is alert and oriented to person, place, and time.  Skin: Rash noted.  Patchy dry scaly dermatitis involving the left inner ankle and foot  Psychiatric: He has a normal mood and affect. His behavior is normal.          Assessment & Plan:   Diabetes mellitus.  We will maintain the patient on metformin and continue to hold Invokana at this time continue  home blood sugar monitoring.  Follow-up in globin A1c in 3 to 6 months Dyslipidemia continue statin therapy Voluntary weight loss  Marletta Lor

## 2018-05-09 DIAGNOSIS — S6391XA Sprain of unspecified part of right wrist and hand, initial encounter: Secondary | ICD-10-CM | POA: Diagnosis not present

## 2018-06-06 ENCOUNTER — Ambulatory Visit: Payer: 59 | Admitting: Family Medicine

## 2018-06-06 ENCOUNTER — Encounter: Payer: Self-pay | Admitting: Family Medicine

## 2018-06-06 VITALS — BP 130/86 | HR 72 | Temp 98.3°F | Wt 191.0 lb

## 2018-06-06 DIAGNOSIS — F419 Anxiety disorder, unspecified: Secondary | ICD-10-CM

## 2018-06-06 DIAGNOSIS — F329 Major depressive disorder, single episode, unspecified: Secondary | ICD-10-CM

## 2018-06-06 DIAGNOSIS — F32A Depression, unspecified: Secondary | ICD-10-CM

## 2018-06-06 MED ORDER — HYDROXYZINE PAMOATE 50 MG PO CAPS: 50.0000 mg | ORAL_CAPSULE | Freq: Three times a day (TID) | ORAL | 1 refills | Status: DC | PRN

## 2018-06-06 NOTE — Progress Notes (Signed)
Subjective:    Patient ID: Derrick West, male    DOB: [birth year], 42 y.o.   MRN: [id number]  No chief complaint on file.   HPI Patient was seen today for acute concern.  Pt endorses increased stress and anxiety x3 weeks.  Pt's wife was scheduled to start the process for renal transplant [facility], however she developed seizures after a triple bypass.  Pt's wife then developed a bedsore and became septic.  She is now at [facility] in the ICU.  Pt endorses financial stress as he cannot miss anymore days from work.  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  Pt also caring for his 81 year old daughter and his disabled mother-in-law.  Nearest family is in [place].  Pt endorses decreased sleep, increased anxiety, depressed mood, decreased energy.  Pt contacted the assistance program at [facility], was advised to contact a counselor, but his phone call was never returned.  Pt has not been in contact with the social worker at the hospital.  Past Medical History:  Diagnosis Date  . Anxiety and depression   . Cholecystitis   . DIABETES MELLITUS, TYPE II 12/24/2007  . Fatty liver   . GANGLION CYST, WRIST, LEFT 07/29/2007  . Headache(784.0)   . OBESITY 12/24/2007    Allergies  Allergen Reactions  . Amoxicillin Other (See Comments)    From childhood; reaction not recalled    ROS General: Denies fever, chills, night sweats, changes in weight, changes in appetite HEENT: Denies headaches, ear pain, changes in vision, rhinorrhea, sore throat CV: Denies CP, palpitations, SOB, orthopnea Pulm: Denies SOB, cough, wheezing GI: Denies abdominal pain, nausea, vomiting, diarrhea, constipation GU: Denies dysuria, hematuria, frequency, vaginal discharge Msk: Denies muscle cramps, joint pains Neuro: Denies weakness, numbness, tingling Skin: Denies rashes, bruising Psych: Denies hallucinations  + anxiety and depression     Objective:    Blood pressure 130/86, pulse 72, temperature  98.3 F (36.8 C), temperature source Oral, weight 191 lb (86.6 kg), SpO2 98 %.   Gen. Pleasant, well-nourished, distraught, tearful, depressed, anxious HEENT: [place]/AT, face symmetric, Lungs: no accessory muscle use Cardiovascular: RRR   Wt Readings from Last 3 Encounters:  06/06/18 191 lb (86.6 kg)  04/01/18 200 lb (90.7 kg)  12/31/17 230 lb (104.3 kg)    Lab Results  Component Value Date   WBC 9.6 09/23/2017   HGB 13.5 09/23/2017   HCT 41.0 09/23/2017   PLT 345 09/23/2017   GLUCOSE 155 (H) 09/23/2017   CHOL 206 (H) 12/31/2017   TRIG 231.0 (H) 12/31/2017   HDL 35.40 (L) 12/31/2017   LDLDIRECT 131.0 12/31/2017   ALT 29 10/07/2017   AST 15 10/07/2017   NA 135 09/23/2017   K 3.8 09/23/2017   CL 98 (L) 09/23/2017   CREATININE 0.70 09/23/2017   BUN 5 (L) 09/23/2017   CO2 24 09/23/2017   TSH 2.53 12/31/2017   INR 0.97 09/13/2017   HGBA1C 5.4 04/01/2018   MICROALBUR 1.6 11/19/2016    Assessment/Plan:  Anxiety and depression -PHQ 9 score 19 -Gad 7 score 18 -Increased anxiety/depression symptoms 2/2 patient's wife's illness. -Recommended counseling services for patient and his daughter in the future -Patient advised to reach out to social work at the hospital and social services department for assistance programs. -Also encouraged to reach out again to [employer].  Possible coworkers could donate time off, etc. -Patient given hydroxyzine 50 mg 3 times daily as needed for  anxiety.  Advised may make sleepy.  Follow-up next week.  Grier Mitts, MD

## 2018-06-06 NOTE — Patient Instructions (Addendum)
You should contact the Social worker at the hospital as they may be able to provider further resources to help you during this stressful time.  You can also try contacting the Social Services Department in town.  A medications called hydroxyzine has been sent to your pharmacy.  You can take if up to 3 times per day if you are feeling increased anxiety.  Please note this medication has the possibility of making you feel sleepy.  Stress and Stress Management Stress is a normal reaction to life events. It is what you feel when life demands more than you are used to or more than you can handle. Some stress can be useful. For example, the stress reaction can help you catch the last bus of the day, study for a test, or meet a deadline at work. But stress that occurs too often or for too long can cause problems. It can affect your emotional health and interfere with relationships and normal daily activities. Too much stress can weaken your immune system and increase your risk for physical illness. If you already have a medical problem, stress can make it worse. What are the causes? All sorts of life events may cause stress. An event that causes stress for one person may not be stressful for another person. Major life events commonly cause stress. These may be positive or negative. Examples include losing your job, moving into a new home, getting married, having a baby, or losing a loved one. Less obvious life events may also cause stress, especially if they occur day after day or in combination. Examples include working long hours, driving in traffic, caring for children, being in debt, or being in a difficult relationship. What are the signs or symptoms? Stress may cause emotional symptoms including, the following:  Anxiety. This is feeling worried, afraid, on edge, overwhelmed, or out of control.  Anger. This is feeling irritated or impatient.  Depression. This is feeling sad, down, helpless, or  guilty.  Difficulty focusing, remembering, or making decisions.  Stress may cause physical symptoms, including the following:  Aches and pains. These may affect your head, neck, back, stomach, or other areas of your body.  Tight muscles or clenched jaw.  Low energy or trouble sleeping.  Stress may cause unhealthy behaviors, including the following:  Eating to feel better (overeating) or skipping meals.  Sleeping too little, too much, or both.  Working too much or putting off tasks (procrastination).  Smoking, drinking alcohol, or using drugs to feel better.  How is this diagnosed? Stress is diagnosed through an assessment by your health care provider. Your health care provider will ask questions about your symptoms and any stressful life events.Your health care provider will also ask about your medical history and may order blood tests or other tests. Certain medical conditions and medicine can cause physical symptoms similar to stress. Mental illness can cause emotional symptoms and unhealthy behaviors similar to stress. Your health care provider may refer you to a mental health professional for further evaluation. How is this treated? Stress management is the recommended treatment for stress.The goals of stress management are reducing stressful life events and coping with stress in healthy ways. Techniques for reducing stressful life events include the following:  Stress identification. Self-monitor for stress and identify what causes stress for you. These skills may help you to avoid some stressful events.  Time management. Set your priorities, keep a calendar of events, and learn to say "no." These tools can help you avoid  making too many commitments.  Techniques for coping with stress include the following:  Rethinking the problem. Try to think realistically about stressful events rather than ignoring them or overreacting. Try to find the positives in a stressful situation  rather than focusing on the negatives.  Exercise. Physical exercise can release both physical and emotional tension. The key is to find a form of exercise you enjoy and do it regularly.  Relaxation techniques. These relax the body and mind. Examples include yoga, meditation, tai chi, biofeedback, deep breathing, progressive muscle relaxation, listening to music, being out in nature, journaling, and other hobbies. Again, the key is to find one or more that you enjoy and can do regularly.  Healthy lifestyle. Eat a balanced diet, get plenty of sleep, and do not smoke. Avoid using alcohol or drugs to relax.  Strong support network. Spend time with family, friends, or other people you enjoy being around.Express your feelings and talk things over with someone you trust.  Counseling or talktherapy with a mental health professional may be helpful if you are having difficulty managing stress on your own. Medicine is typically not recommended for the treatment of stress.Talk to your health care provider if you think you need medicine for symptoms of stress. Follow these instructions at home:  Keep all follow-up visits as directed by your health care provider.  Take all medicines as directed by your health care provider. Contact a health care provider if:  Your symptoms get worse or you start having new symptoms.  You feel overwhelmed by your problems and can no longer manage them on your own. Get help right away if:  You feel like hurting yourself or someone else. This information is not intended to replace advice given to you by your health care provider. Make sure you discuss any questions you have with your health care provider. Document Released: 03/06/2001 Document Revised: 02/16/2016 Document Reviewed: 05/05/2013 Elsevier Interactive Patient Education  2017 [employer] After being diagnosed with an anxiety disorder, you may be relieved to know why you have felt or  behaved a certain way. It is natural to also feel overwhelmed about the treatment ahead and what it will mean for your life. With care and support, you can manage this condition and recover from it. How to cope with anxiety Dealing with stress Stress is your body's reaction to life changes and events, both good and bad. Stress can last just a few hours or it can be ongoing. Stress can play a major role in anxiety, so it is important to learn both how to cope with stress and how to think about it differently. Talk with your health care provider or a counselor to learn more about stress reduction. He or she may suggest some stress reduction techniques, such as:  Music therapy. This can include creating or listening to music that you enjoy and that inspires you.  Mindfulness-based meditation. This involves being aware of your normal breaths, rather than trying to control your breathing. It can be done while sitting or walking.  Centering prayer. This is a kind of meditation that involves focusing on a word, phrase, or sacred image that is meaningful to you and that brings you peace.  Deep breathing. To do this, expand your stomach and inhale slowly through your nose. Hold your breath for 3-5 seconds. Then exhale slowly, allowing your stomach muscles to relax.  Self-talk. This is a skill where you identify thought patterns that lead to anxiety  reactions and correct those thoughts.  Muscle relaxation. This involves tensing muscles then relaxing them.  Choose a stress reduction technique that fits your lifestyle and personality. Stress reduction techniques take time and practice. Set aside 5-15 minutes a day to do them. Therapists can offer training in these techniques. The training may be covered by some insurance plans. Other things you can do to manage stress include:  Keeping a stress diary. This can help you learn what triggers your stress and ways to control your response.  Thinking about how  you respond to certain situations. You may not be able to control everything, but you can control your reaction.  Making time for activities that help you relax, and not feeling guilty about spending your time in this way.  Therapy combined with coping and stress-reduction skills provides the best chance for successful treatment. Medicines Medicines can help ease symptoms. Medicines for anxiety include:  Anti-anxiety drugs.  Antidepressants.  Beta-blockers.  Medicines may be used as the main treatment for anxiety disorder, along with therapy, or if other treatments are not working. Medicines should be prescribed by a health care provider. Relationships Relationships can play a big part in helping you recover. Try to spend more time connecting with trusted friends and family members. Consider going to couples counseling, taking family education classes, or going to family therapy. Therapy can help you and others better understand the condition. How to recognize changes in your condition Everyone has a different response to treatment for anxiety. Recovery from anxiety happens when symptoms decrease and stop interfering with your daily activities at home or work. This may mean that you will start to:  Have better concentration and focus.  Sleep better.  Be less irritable.  Have more energy.  Have improved memory.  It is important to recognize when your condition is getting worse. Contact your health care provider if your symptoms interfere with home or work and you do not feel like your condition is improving. Where to find help and support: You can get help and support from these sources:  Self-help groups.  Online and [employer].  A trusted spiritual leader.  Couples counseling.  Family education classes.  Family therapy.  Follow these instructions at home:  Eat a healthy diet that includes plenty of vegetables, fruits, whole grains, low-fat dairy products,  and lean protein. Do not eat a lot of foods that are high in solid fats, added sugars, or salt.  Exercise. Most adults should do the following: ? Exercise for at least 150 minutes each week. The exercise should increase your heart rate and make you sweat (moderate-intensity exercise). ? Strengthening exercises at least twice a week.  Cut down on caffeine, tobacco, alcohol, and other potentially harmful substances.  Get the right amount and quality of sleep. Most adults need 7-9 hours of sleep each night.  Make choices that simplify your life.  Take over-the-counter and prescription medicines only as told by your health care provider.  Avoid caffeine, alcohol, and certain over-the-counter cold medicines. These may make you feel worse. Ask your pharmacist which medicines to avoid.  Keep all follow-up visits as told by your health care provider. This is important. Questions to ask your health care provider  Would I benefit from therapy?  How often should I follow up with a health care provider?  How long do I need to take medicine?  Are there any long-term side effects of my medicine?  Are there any alternatives to taking medicine? Contact  a health care provider if:  You have a hard time staying focused or finishing daily tasks.  You spend many hours a day feeling worried about everyday life.  You become exhausted by worry.  You start to have headaches, feel tense, or have nausea.  You urinate more than normal.  You have diarrhea. Get help right away if:  You have a racing heart and shortness of breath.  You have thoughts of hurting yourself or others. If you ever feel like you may hurt yourself or others, or have thoughts about taking your own life, get help right away. You can go to your nearest emergency department or call:  Your local emergency services (911 in the [place]  A suicide crisis helpline, such as the [place] at 423-189-7243.  This is open 24-hours a day.  Summary  Taking steps to deal with stress can help calm you.  Medicines cannot cure anxiety disorders, but they can help ease symptoms.  Family, friends, and partners can play a big part in helping you recover from an anxiety disorder. This information is not intended to replace advice given to you by your health care provider. Make sure you discuss any questions you have with your health care provider. Document Released: 09/04/2016 Document Revised: 09/04/2016 Document Reviewed: 09/04/2016 Elsevier Interactive Patient Education  [employer].

## 2018-07-02 ENCOUNTER — Encounter: Payer: Self-pay | Admitting: Family Medicine

## 2018-07-02 ENCOUNTER — Ambulatory Visit: Payer: 59 | Admitting: Family Medicine

## 2018-07-02 VITALS — BP 146/80 | HR 86 | Temp 98.2°F | Wt 207.0 lb

## 2018-07-02 DIAGNOSIS — F439 Reaction to severe stress, unspecified: Secondary | ICD-10-CM | POA: Diagnosis not present

## 2018-07-02 DIAGNOSIS — F419 Anxiety disorder, unspecified: Secondary | ICD-10-CM | POA: Diagnosis not present

## 2018-07-02 MED ORDER — HYDROXYZINE HCL 25 MG PO TABS: 25.0000 mg | ORAL_TABLET | Freq: Three times a day (TID) | ORAL | 0 refills | Status: DC | PRN

## 2018-07-02 NOTE — Patient Instructions (Signed)
Living With Anxiety After being diagnosed with an anxiety disorder, you may be relieved to know why you have felt or behaved a certain way. It is natural to also feel overwhelmed about the treatment ahead and what it will mean for your life. With care and support, you can manage this condition and recover from it. How to cope with anxiety Dealing with stress Stress is your body's reaction to life changes and events, both good and bad. Stress can last just a few hours or it can be ongoing. Stress can play a major role in anxiety, so it is important to learn both how to cope with stress and how to think about it differently. Talk with your health care provider or a counselor to learn more about stress reduction. He or she may suggest some stress reduction techniques, such as:  Music therapy. This can include creating or listening to music that you enjoy and that inspires you.  Mindfulness-based meditation. This involves being aware of your normal breaths, rather than trying to control your breathing. It can be done while sitting or walking.  Centering prayer. This is a kind of meditation that involves focusing on a word, phrase, or sacred image that is meaningful to you and that brings you peace.  Deep breathing. To do this, expand your stomach and inhale slowly through your nose. Hold your breath for 3-5 seconds. Then exhale slowly, allowing your stomach muscles to relax.  Self-talk. This is a skill where you identify thought patterns that lead to anxiety reactions and correct those thoughts.  Muscle relaxation. This involves tensing muscles then relaxing them.  Choose a stress reduction technique that fits your lifestyle and personality. Stress reduction techniques take time and practice. Set aside 5-15 minutes a day to do them. Therapists can offer training in these techniques. The training may be covered by some insurance plans. Other things you can do to manage stress include:  Keeping a  stress diary. This can help you learn what triggers your stress and ways to control your response.  Thinking about how you respond to certain situations. You may not be able to control everything, but you can control your reaction.  Making time for activities that help you relax, and not feeling guilty about spending your time in this way.  Therapy combined with coping and stress-reduction skills provides the best chance for successful treatment. Medicines Medicines can help ease symptoms. Medicines for anxiety include:  Anti-anxiety drugs.  Antidepressants.  Beta-blockers.  Medicines may be used as the main treatment for anxiety disorder, along with therapy, or if other treatments are not working. Medicines should be prescribed by a health care provider. Relationships Relationships can play a big part in helping you recover. Try to spend more time connecting with trusted friends and family members. Consider going to couples counseling, taking family education classes, or going to family therapy. Therapy can help you and others better understand the condition. How to recognize changes in your condition Everyone has a different response to treatment for anxiety. Recovery from anxiety happens when symptoms decrease and stop interfering with your daily activities at home or work. This may mean that you will start to:  Have better concentration and focus.  Sleep better.  Be less irritable.  Have more energy.  Have improved memory.  It is important to recognize when your condition is getting worse. Contact your health care provider if your symptoms interfere with home or work and you do not feel like your condition  is improving. Where to find help and support: You can get help and support from these sources:  Self-help groups.  Online and [employer].  A trusted spiritual leader.  Couples counseling.  Family education classes.  Family therapy.  Follow these  instructions at home:  Eat a healthy diet that includes plenty of vegetables, fruits, whole grains, low-fat dairy products, and lean protein. Do not eat a lot of foods that are high in solid fats, added sugars, or salt.  Exercise. Most adults should do the following: ? Exercise for at least 150 minutes each week. The exercise should increase your heart rate and make you sweat (moderate-intensity exercise). ? Strengthening exercises at least twice a week.  Cut down on caffeine, tobacco, alcohol, and other potentially harmful substances.  Get the right amount and quality of sleep. Most adults need 7-9 hours of sleep each night.  Make choices that simplify your life.  Take over-the-counter and prescription medicines only as told by your health care provider.  Avoid caffeine, alcohol, and certain over-the-counter cold medicines. These may make you feel worse. Ask your pharmacist which medicines to avoid.  Keep all follow-up visits as told by your health care provider. This is important. Questions to ask your health care provider  Would I benefit from therapy?  How often should I follow up with a health care provider?  How long do I need to take medicine?  Are there any long-term side effects of my medicine?  Are there any alternatives to taking medicine? Contact a health care provider if:  You have a hard time staying focused or finishing daily tasks.  You spend many hours a day feeling worried about everyday life.  You become exhausted by worry.  You start to have headaches, feel tense, or have nausea.  You urinate more than normal.  You have diarrhea. Get help right away if:  You have a racing heart and shortness of breath.  You have thoughts of hurting yourself or others. If you ever feel like you may hurt yourself or others, or have thoughts about taking your own life, get help right away. You can go to your nearest emergency department or call:  Your local emergency  services (911 in the [place]  A suicide crisis helpline, such as the [place] at (707) 881-6441. This is open 24-hours a day.  Summary  Taking steps to deal with stress can help calm you.  Medicines cannot cure anxiety disorders, but they can help ease symptoms.  Family, friends, and partners can play a big part in helping you recover from an anxiety disorder. This information is not intended to replace advice given to you by your health care provider. Make sure you discuss any questions you have with your health care provider. Document Released: 09/04/2016 Document Revised: 09/04/2016 Document Reviewed: 09/04/2016 Elsevier Interactive Patient Education  2018 [employer] and Stress Management Stress is a normal reaction to life events. It is what you feel when life demands more than you are used to or more than you can handle. Some stress can be useful. For example, the stress reaction can help you catch the last bus of the day, study for a test, or meet a deadline at work. But stress that occurs too often or for too long can cause problems. It can affect your emotional health and interfere with relationships and normal daily activities. Too much stress can weaken your immune system and increase your risk for physical illness. If  you already have a medical problem, stress can make it worse. What are the causes? All sorts of life events may cause stress. An event that causes stress for one person may not be stressful for another person. Major life events commonly cause stress. These may be positive or negative. Examples include losing your job, moving into a new home, getting married, having a baby, or losing a loved one. Less obvious life events may also cause stress, especially if they occur day after day or in combination. Examples include working long hours, driving in traffic, caring for children, being in debt, or being in a difficult relationship. What  are the signs or symptoms? Stress may cause emotional symptoms including, the following:  Anxiety. This is feeling worried, afraid, on edge, overwhelmed, or out of control.  Anger. This is feeling irritated or impatient.  Depression. This is feeling sad, down, helpless, or guilty.  Difficulty focusing, remembering, or making decisions.  Stress may cause physical symptoms, including the following:  Aches and pains. These may affect your head, neck, back, stomach, or other areas of your body.  Tight muscles or clenched jaw.  Low energy or trouble sleeping.  Stress may cause unhealthy behaviors, including the following:  Eating to feel better (overeating) or skipping meals.  Sleeping too little, too much, or both.  Working too much or putting off tasks (procrastination).  Smoking, drinking alcohol, or using drugs to feel better.  How is this diagnosed? Stress is diagnosed through an assessment by your health care provider. Your health care provider will ask questions about your symptoms and any stressful life events.Your health care provider will also ask about your medical history and may order blood tests or other tests. Certain medical conditions and medicine can cause physical symptoms similar to stress. Mental illness can cause emotional symptoms and unhealthy behaviors similar to stress. Your health care provider may refer you to a mental health professional for further evaluation. How is this treated? Stress management is the recommended treatment for stress.The goals of stress management are reducing stressful life events and coping with stress in healthy ways. Techniques for reducing stressful life events include the following:  Stress identification. Self-monitor for stress and identify what causes stress for you. These skills may help you to avoid some stressful events.  Time management. Set your priorities, keep a calendar of events, and learn to say "no." These tools  can help you avoid making too many commitments.  Techniques for coping with stress include the following:  Rethinking the problem. Try to think realistically about stressful events rather than ignoring them or overreacting. Try to find the positives in a stressful situation rather than focusing on the negatives.  Exercise. Physical exercise can release both physical and emotional tension. The key is to find a form of exercise you enjoy and do it regularly.  Relaxation techniques. These relax the body and mind. Examples include yoga, meditation, tai chi, biofeedback, deep breathing, progressive muscle relaxation, listening to music, being out in nature, journaling, and other hobbies. Again, the key is to find one or more that you enjoy and can do regularly.  Healthy lifestyle. Eat a balanced diet, get plenty of sleep, and do not smoke. Avoid using alcohol or drugs to relax.  Strong support network. Spend time with family, friends, or other people you enjoy being around.Express your feelings and talk things over with someone you trust.  Counseling or talktherapy with a mental health professional may be helpful if you are having   difficulty managing stress on your own. Medicine is typically not recommended for the treatment of stress.Talk to your health care provider if you think you need medicine for symptoms of stress. Follow these instructions at home:  Keep all follow-up visits as directed by your health care provider.  Take all medicines as directed by your health care provider. Contact a health care provider if:  Your symptoms get worse or you start having new symptoms.  You feel overwhelmed by your problems and can no longer manage them on your own. Get help right away if:  You feel like hurting yourself or someone else. This information is not intended to replace advice given to you by your health care provider. Make sure you discuss any questions you have with your health care  provider. Document Released: 03/06/2001 Document Revised: 02/16/2016 Document Reviewed: 05/05/2013 Elsevier Interactive Patient Education  2017 [employer]  

## 2018-07-02 NOTE — Progress Notes (Signed)
Subjective:    Patient ID: Derrick West, male    DOB: [birth year], 42 y.o.   MRN: [id number]  No chief complaint on file.   HPI Patient was seen today for f/u on stress and anxiety.  Pt's wife is still in the hospital.  She is still having days where she hallucinates and is combative.  Pt states he had some difficult conversations with his daughter, Duanne Limerick.  Pt tried Vistaril 50 mg, but states it made him sleepy and groggy when he woke up.  Pt notes he changed shifts at work, now going in at 3:30 am and getting off in the afternoon.  This enables him to pick up Duanne Limerick from school, then go to the hospital.    Pt states he had on counseling session with Wallis Mart, through the [employer].  Pt states the session went ok.  He admits he didn't know what to expect.  Pt notes he was advised to consider short term disability as this would be paid leave.  Past Medical History:  Diagnosis Date  . Anxiety and depression   . Cholecystitis   . DIABETES MELLITUS, TYPE II 12/24/2007  . Fatty liver   . GANGLION CYST, WRIST, LEFT 07/29/2007  . Headache(784.0)   . OBESITY 12/24/2007    Allergies  Allergen Reactions  . Amoxicillin Other (See Comments)    From childhood; reaction not recalled    ROS General: Denies fever, chills, night sweats, changes in weight, changes in appetite HEENT: Denies headaches, ear pain, changes in vision, rhinorrhea, sore throat CV: Denies CP, palpitations, SOB, orthopnea Pulm: Denies SOB, cough, wheezing GI: Denies abdominal pain, nausea, vomiting, diarrhea, constipation GU: Denies dysuria, hematuria, frequency, vaginal discharge Msk: Denies muscle cramps, joint pains Neuro: Denies weakness, numbness, tingling Skin: Denies rashes, bruising Psych: Denies depression, hallucinations  +anxiety and stress     Objective:    Blood pressure (!) 146/80, pulse 86, temperature 98.2 F (36.8 C), temperature source Oral, weight 207 lb (93.9 kg), SpO2 98 %.   Gen. Pleasant,  well-nourished, anxious, normal affect, near tears at times. HEENT: [place]/AT, face symmetric, no scleral icterus, PERRLA,  nares patent without drainage Lungs: no accessory muscle use Cardiovascular: RRR, no peripheral edema Neuro:  A&Ox3, CN II-XII intact, normal gait  Wt Readings from Last 3 Encounters:  07/02/18 207 lb (93.9 kg)  06/06/18 191 lb (86.6 kg)  04/01/18 200 lb (90.7 kg)    Lab Results  Component Value Date   WBC 9.6 09/23/2017   HGB 13.5 09/23/2017   HCT 41.0 09/23/2017   PLT 345 09/23/2017   GLUCOSE 155 (H) 09/23/2017   CHOL 206 (H) 12/31/2017   TRIG 231.0 (H) 12/31/2017   HDL 35.40 (L) 12/31/2017   LDLDIRECT 131.0 12/31/2017   ALT 29 10/07/2017   AST 15 10/07/2017   NA 135 09/23/2017   K 3.8 09/23/2017   CL 98 (L) 09/23/2017   CREATININE 0.70 09/23/2017   BUN 5 (L) 09/23/2017   CO2 24 09/23/2017   TSH 2.53 12/31/2017   INR 0.97 09/13/2017   HGBA1C 5.4 04/01/2018   MICROALBUR 1.6 11/19/2016    Assessment/Plan:  Stress -discussed ways to reduce stress given the current situation.  Pt to try walking around the block with his daughter to talk/spend time together. -given handout  Anxiety -pt encouraged to continue counseling through his job ([employer]) -given handout  - Plan: hydrOXYzine (ATARAX/VISTARIL) 25 MG tablet -pt given a note for work to be out until 10/31.  Pt  to f/u in the next few wks to re-evaluate ability to return to work.  F/u in the next few wks.  Grier Mitts, MD

## 2018-07-15 ENCOUNTER — Telehealth: Payer: Self-pay | Admitting: *Deleted

## 2018-07-15 ENCOUNTER — Ambulatory Visit: Payer: 59 | Admitting: Family Medicine

## 2018-07-15 ENCOUNTER — Encounter: Payer: Self-pay | Admitting: Family Medicine

## 2018-07-15 VITALS — BP 126/84 | HR 84 | Temp 98.6°F | Ht 67.0 in | Wt 202.0 lb

## 2018-07-15 DIAGNOSIS — Z8639 Personal history of other endocrine, nutritional and metabolic disease: Secondary | ICD-10-CM

## 2018-07-15 DIAGNOSIS — F439 Reaction to severe stress, unspecified: Secondary | ICD-10-CM

## 2018-07-15 DIAGNOSIS — F419 Anxiety disorder, unspecified: Secondary | ICD-10-CM | POA: Diagnosis not present

## 2018-07-15 DIAGNOSIS — Z23 Encounter for immunization: Secondary | ICD-10-CM

## 2018-07-15 DIAGNOSIS — E782 Mixed hyperlipidemia: Secondary | ICD-10-CM | POA: Diagnosis not present

## 2018-07-15 NOTE — Telephone Encounter (Signed)
Copied from [facility] [phone]. Topic: Quick Communication - See Telephone Encounter >> Jul 15, 2018  3:03 PM Gardiner Ramus wrote: CRM for notification. See Telephone encounter for: 07/15/18. Pt called and stated that [facility] faxed over medical records request. Pt is calling to check status. Pt stated that he needs thin turned into them by 07/18/18. Please advise

## 2018-07-15 NOTE — Progress Notes (Signed)
Subjective:    Patient ID: Derrick West, male    DOB: 02/13/1976, 42 y.o.   MRN: [id number]  No chief complaint on file.   HPI Patient was seen today for follow-up on chronic conditions and TOC, previously seen by Dr. Burnice Logan.  Pt states the change in dose of hydroxyzine still makes him sleepy so he takes it at night to help him go to sleep.  Pt notes his wife has been transferred to an [facility] in [place] as she needs to have dialysis laying down.  The time off from work is helping pt.  Pt has an appt with a psychiatrist next wk as recommended by his job.    He does note it is hard having difficult discussions with his daughter as he often becomes emotional.  Pt is thinking about finding counseling services for her.  H/o DM: -hgb A1C was 7.9% 11/19/16, now 5.4% -pt notes wt loss helped lower his A1C. -pt was also rx'd metformin 1000 mg, but is no longer taking it -pt typically checks fsbs daily, but has done so less since his wife has been ill. -pt continues to make better food choices.  HLD: -taking lipitor 10 mg daily -denies myalgias  Past Medical History:  Diagnosis Date  . Anxiety and depression   . Cholecystitis   . DIABETES MELLITUS, TYPE II 12/24/2007  . Fatty liver   . GANGLION CYST, WRIST, LEFT 07/29/2007  . Headache(784.0)   . OBESITY 12/24/2007    Allergies  Allergen Reactions  . Amoxicillin Other (See Comments)    From childhood; reaction not recalled    ROS General: Denies fever, chills, night sweats, changes in weight, changes in appetite HEENT: Denies headaches, ear pain, changes in vision, rhinorrhea, sore throat CV: Denies CP, palpitations, SOB, orthopnea Pulm: Denies SOB, cough, wheezing GI: Denies abdominal pain, nausea, vomiting, diarrhea, constipation GU: Denies dysuria, hematuria, frequency, vaginal discharge Msk: Denies muscle cramps, joint pains Neuro: Denies weakness, numbness, tingling Skin: Denies rashes, bruising Psych: Denies depression,  hallucinations  +anxiety, increased stress     Objective:    Blood pressure 126/84, pulse 84, temperature 98.6 F (37 C), temperature source Oral, height 5\' 7"  (1.702 m), weight 202 lb (91.6 kg), SpO2 97 %.   Gen. Pleasant, well-nourished, in no distress, normal affect   HEENT: [place]/AT, face symmetric, no scleral icterus, PERRLA, nares patent without drainage Lungs: no accessory muscle use, CTAB, no wheezes or rales Cardiovascular: RRR, no m/r/g, no peripheral edema Neuro:  A&Ox3, CN II-XII intact, normal gait Skin:  Warm, no lesions/ rash   Wt Readings from Last 3 Encounters:  07/15/18 202 lb (91.6 kg)  07/02/18 207 lb (93.9 kg)  06/06/18 191 lb (86.6 kg)    Lab Results  Component Value Date   WBC 9.6 09/23/2017   HGB 13.5 09/23/2017   HCT 41.0 09/23/2017   PLT 345 09/23/2017   GLUCOSE 155 (H) 09/23/2017   CHOL 206 (H) 12/31/2017   TRIG 231.0 (H) 12/31/2017   HDL 35.40 (L) 12/31/2017   LDLDIRECT 131.0 12/31/2017   ALT 29 10/07/2017   AST 15 10/07/2017   NA 135 09/23/2017   K 3.8 09/23/2017   CL 98 (L) 09/23/2017   CREATININE 0.70 09/23/2017   BUN 5 (L) 09/23/2017   CO2 24 09/23/2017   TSH 2.53 12/31/2017   INR 0.97 09/13/2017   HGBA1C 5.4 04/01/2018   MICROALBUR 1.6 11/19/2016    Assessment/Plan:  Stress -discussed continuing to reduce stress -pt encouraged to  keep appt with psychiatrist.  Anxiety -PHQ 9 score 15 this visit -Gad 7 score 17 this visit -discussed importance of counseling. -continue hydroxyzine 25 mg qhs prn -Given a list of area counselors and [place] providers for patient's daughter.  Need for immunization against influenza -influenz vaccine given   Mixed hyperlipidemia -Continue Lipitor 10 mg daily -Continue lifestyle modifications  History of diet-controlled diabetes -Discussed the importance of continued lifestyle modifications -Patient to continue checking FSBS regularly at home -We will hold off on restarting metformin 1000  mg.  Follow-up in 1 month, sooner if needed  Grier Mitts, MD

## 2018-07-15 NOTE — Patient Instructions (Signed)
Stress and Stress Management Stress is a normal reaction to life events. It is what you feel when life demands more than you are used to or more than you can handle. Some stress can be useful. For example, the stress reaction can help you catch the last bus of the day, study for a test, or meet a deadline at work. But stress that occurs too often or for too long can cause problems. It can affect your emotional health and interfere with relationships and normal daily activities. Too much stress can weaken your immune system and increase your risk for physical illness. If you already have a medical problem, stress can make it worse. What are the causes? All sorts of life events may cause stress. An event that causes stress for one person may not be stressful for another person. Major life events commonly cause stress. These may be positive or negative. Examples include losing your job, moving into a new home, getting married, having a baby, or losing a loved one. Less obvious life events may also cause stress, especially if they occur day after day or in combination. Examples include working long hours, driving in traffic, caring for children, being in debt, or being in a difficult relationship. What are the signs or symptoms? Stress may cause emotional symptoms including, the following:  Anxiety. This is feeling worried, afraid, on edge, overwhelmed, or out of control.  Anger. This is feeling irritated or impatient.  Depression. This is feeling sad, down, helpless, or guilty.  Difficulty focusing, remembering, or making decisions.  Stress may cause physical symptoms, including the following:  Aches and pains. These may affect your head, neck, back, stomach, or other areas of your body.  Tight muscles or clenched jaw.  Low energy or trouble sleeping.  Stress may cause unhealthy behaviors, including the following:  Eating to feel better (overeating) or skipping meals.  Sleeping too little,  too much, or both.  Working too much or putting off tasks (procrastination).  Smoking, drinking alcohol, or using drugs to feel better.  How is this diagnosed? Stress is diagnosed through an assessment by your health care provider. Your health care provider will ask questions about your symptoms and any stressful life events.Your health care provider will also ask about your medical history and may order blood tests or other tests. Certain medical conditions and medicine can cause physical symptoms similar to stress. Mental illness can cause emotional symptoms and unhealthy behaviors similar to stress. Your health care provider may refer you to a mental health professional for further evaluation. How is this treated? Stress management is the recommended treatment for stress.The goals of stress management are reducing stressful life events and coping with stress in healthy ways. Techniques for reducing stressful life events include the following:  Stress identification. Self-monitor for stress and identify what causes stress for you. These skills may help you to avoid some stressful events.  Time management. Set your priorities, keep a calendar of events, and learn to say "no." These tools can help you avoid making too many commitments.  Techniques for coping with stress include the following:  Rethinking the problem. Try to think realistically about stressful events rather than ignoring them or overreacting. Try to find the positives in a stressful situation rather than focusing on the negatives.  Exercise. Physical exercise can release both physical and emotional tension. The key is to find a form of exercise you enjoy and do it regularly.  Relaxation techniques. These relax the body and  mind. Examples include yoga, meditation, tai chi, biofeedback, deep breathing, progressive muscle relaxation, listening to music, being out in nature, journaling, and other hobbies. Again, the key is to find  one or more that you enjoy and can do regularly.  Healthy lifestyle. Eat a balanced diet, get plenty of sleep, and do not smoke. Avoid using alcohol or drugs to relax.  Strong support network. Spend time with family, friends, or other people you enjoy being around.Express your feelings and talk things over with someone you trust.  Counseling or talktherapy with a mental health professional may be helpful if you are having difficulty managing stress on your own. Medicine is typically not recommended for the treatment of stress.Talk to your health care provider if you think you need medicine for symptoms of stress. Follow these instructions at home:  Keep all follow-up visits as directed by your health care provider.  Take all medicines as directed by your health care provider. Contact a health care provider if:  Your symptoms get worse or you start having new symptoms.  You feel overwhelmed by your problems and can no longer manage them on your own. Get help right away if:  You feel like hurting yourself or someone else. This information is not intended to replace advice given to you by your health care provider. Make sure you discuss any questions you have with your health care provider. Document Released: 03/06/2001 Document Revised: 02/16/2016 Document Reviewed: 05/05/2013 Elsevier Interactive Patient Education  2017 Else[employer] Inc.  Living With Anxiety After being diagnosed with an anxiety disorder, you may be relieved to know why you have felt or behaved a certain way. It is natural to also feel overwhelmed about the treatment ahead and what it will mean for your life. With care and support, you can manage this condition and recover from it. How to cope with anxiety Dealing with stress Stress is your body's reaction to life changes and events, both good and bad. Stress can last just a few hours or it can be ongoing. Stress can play a major role in anxiety, so it is important to learn  both how to cope with stress and how to think about it differently. Talk with your health care provider or a counselor to learn more about stress reduction. He or she may suggest some stress reduction techniques, such as:  Music therapy. This can include creating or listening to music that you enjoy and that inspires you.  Mindfulness-based meditation. This involves being aware of your normal breaths, rather than trying to control your breathing. It can be done while sitting or walking.  Centering prayer. This is a kind of meditation that involves focusing on a word, phrase, or sacred image that is meaningful to you and that brings you peace.  Deep breathing. To do this, expand your stomach and inhale slowly through your nose. Hold your breath for 3-5 seconds. Then exhale slowly, allowing your stomach muscles to relax.  Self-talk. This is a skill where you identify thought patterns that lead to anxiety reactions and correct those thoughts.  Muscle relaxation. This involves tensing muscles then relaxing them.  Choose a stress reduction technique that fits your lifestyle and personality. Stress reduction techniques take time and practice. Set aside 5-15 minutes a day to do them. Therapists can offer training in these techniques. The training may be covered by some insurance plans. Other things you can do to manage stress include:  Keeping a stress diary. This can help you learn what   triggers your stress and ways to control your response.  Thinking about how you respond to certain situations. You may not be able to control everything, but you can control your reaction.  Making time for activities that help you relax, and not feeling guilty about spending your time in this way.  Therapy combined with coping and stress-reduction skills provides the best chance for successful treatment. Medicines Medicines can help ease symptoms. Medicines for anxiety include:  Anti-anxiety  drugs.  Antidepressants.  Beta-blockers.  Medicines may be used as the main treatment for anxiety disorder, along with therapy, or if other treatments are not working. Medicines should be prescribed by a health care provider. Relationships Relationships can play a big part in helping you recover. Try to spend more time connecting with trusted friends and family members. Consider going to couples counseling, taking family education classes, or going to family therapy. Therapy can help you and others better understand the condition. How to recognize changes in your condition Everyone has a different response to treatment for anxiety. Recovery from anxiety happens when symptoms decrease and stop interfering with your daily activities at home or work. This may mean that you will start to:  Have better concentration and focus.  Sleep better.  Be less irritable.  Have more energy.  Have improved memory.  It is important to recognize when your condition is getting worse. Contact your health care provider if your symptoms interfere with home or work and you do not feel like your condition is improving. Where to find help and support: You can get help and support from these sources:  Self-help groups.  Online and community organizations.  A trusted spiritual leader.  Couples counseling.  Family education classes.  Family therapy.  Follow these instructions at home:  Eat a healthy diet that includes plenty of vegetables, fruits, whole grains, low-fat dairy products, and lean protein. Do not eat a lot of foods that are high in solid fats, added sugars, or salt.  Exercise. Most adults should do the following: ? Exercise for at least 150 minutes each week. The exercise should increase your heart rate and make you sweat (moderate-intensity exercise). ? Strengthening exercises at least twice a week.  Cut down on caffeine, tobacco, alcohol, and other potentially harmful  substances.  Get the right amount and quality of sleep. Most adults need 7-9 hours of sleep each night.  Make choices that simplify your life.  Take over-the-counter and prescription medicines only as told by your health care provider.  Avoid caffeine, alcohol, and certain over-the-counter cold medicines. These may make you feel worse. Ask your pharmacist which medicines to avoid.  Keep all follow-up visits as told by your health care provider. This is important. Questions to ask your health care provider  Would I benefit from therapy?  How often should I follow up with a health care provider?  How long do I need to take medicine?  Are there any long-term side effects of my medicine?  Are there any alternatives to taking medicine? Contact a health care provider if:  You have a hard time staying focused or finishing daily tasks.  You spend many hours a day feeling worried about everyday life.  You become exhausted by worry.  You start to have headaches, feel tense, or have nausea.  You urinate more than normal.  You have diarrhea. Get help right away if:  You have a racing heart and shortness of breath.  You have thoughts of hurting yourself or   others. If you ever feel like you may hurt yourself or others, or have thoughts about taking your own life, get help right away. You can go to your nearest emergency department or call:  Your local emergency services (911 in the [place]  A suicide crisis helpline, such as the [place] at 812-883-6009. This is open 24-hours a day.  Summary  Taking steps to deal with stress can help calm you.  Medicines cannot cure anxiety disorders, but they can help ease symptoms.  Family, friends, and partners can play a big part in helping you recover from an anxiety disorder. This information is not intended to replace advice given to you by your health care provider. Make sure you discuss any questions you  have with your health care provider. Document Released: 09/04/2016 Document Revised: 09/04/2016 Document Reviewed: 09/04/2016 Elsevier Interactive Patient Education  [employer].

## 2018-07-15 NOTE — Telephone Encounter (Signed)
Phone number to [facility] given to patient

## 2018-07-18 NOTE — Telephone Encounter (Signed)
Called Derrick West left a detailed message regarding pt work form that needs completing, Advised that our office will call back on Tuesday regarding the form since Dr Volanda Napoleon will be out and returns on that day.

## 2018-07-18 NOTE — Telephone Encounter (Signed)
Margarita Grizzle from [place] called regarding medical record request that has been faxed to office. Margarita Grizzle states that this is time sensitive and is requesting a call back as soon as possible.   Margarita Grizzle cb# [phone] Ext [phone]  Fax#[phone]

## 2018-07-23 NOTE — Telephone Encounter (Signed)
Spoke with pt state that the Disability forms had been received and processed. Nothing further needed

## 2018-08-15 ENCOUNTER — Ambulatory Visit: Payer: Self-pay | Admitting: Family Medicine

## 2018-08-18 ENCOUNTER — Ambulatory Visit: Payer: Self-pay | Admitting: Family Medicine

## 2018-08-18 DIAGNOSIS — Z0289 Encounter for other administrative examinations: Secondary | ICD-10-CM

## 2018-08-26 DIAGNOSIS — Z743 Need for continuous supervision: Secondary | ICD-10-CM | POA: Diagnosis not present

## 2018-08-26 DIAGNOSIS — K9420 Gastrostomy complication, unspecified: Secondary | ICD-10-CM | POA: Diagnosis not present

## 2018-11-03 ENCOUNTER — Other Ambulatory Visit: Payer: Self-pay | Admitting: Family Medicine

## 2018-11-03 DIAGNOSIS — F329 Major depressive disorder, single episode, unspecified: Secondary | ICD-10-CM

## 2018-11-03 DIAGNOSIS — F32A Depression, unspecified: Secondary | ICD-10-CM

## 2018-11-03 DIAGNOSIS — F419 Anxiety disorder, unspecified: Principal | ICD-10-CM

## 2018-12-11 DIAGNOSIS — J020 Streptococcal pharyngitis: Secondary | ICD-10-CM | POA: Diagnosis not present

## 2018-12-11 DIAGNOSIS — J029 Acute pharyngitis, unspecified: Secondary | ICD-10-CM | POA: Diagnosis not present

## 2019-01-19 ENCOUNTER — Other Ambulatory Visit: Payer: Self-pay | Admitting: Family Medicine

## 2019-01-19 DIAGNOSIS — F32A Depression, unspecified: Secondary | ICD-10-CM

## 2019-01-19 DIAGNOSIS — F329 Major depressive disorder, single episode, unspecified: Secondary | ICD-10-CM

## 2019-01-19 DIAGNOSIS — F419 Anxiety disorder, unspecified: Principal | ICD-10-CM

## 2019-01-19 IMAGING — CT CT ABD-PELV W/ CM
2 of 5 series · 15 of 46 positions shown, 17 images · IV contrast (APPLIED)
Comparison: Multiple exams, including 09/05/2017

ADDENDUM:
The original report was by Dr. Mary Ellen Honda. The following
addendum is by Dr. Mary Ellen Honda:

Critical Value/emergent results were called by telephone at the time
of interpretation on 09/14/2017 at [DATE] to Dr. JINQUAN AZZUAN , who
verbally acknowledged these results. We discussed possible causes
for the greater than expected free intraperitoneal gas, including
conceivably leak from the leaking cystic duct remnant.
CLINICAL DATA: Post cholecystectomy bile leak, continued right
upper quadrant abdominal pain and fever. Postop day 9.
EXAM:
CT ABDOMEN AND PELVIS WITH CONTRAST
TECHNIQUE: Multidetector CT imaging of the abdomen and pelvis was performed
using the standard protocol following bolus administration of
intravenous contrast.
CONTRAST:  100mL [id number] IOPAMIDOL (PN04UI-UGG) INJECTION 61%

[Series 3: abdomen 5.0 · axial · 0.98mm/px · z∈[+1010,+1495]mm · 12 of 111 slices shown, 14 images]
[im 7/111  soft-tissue]
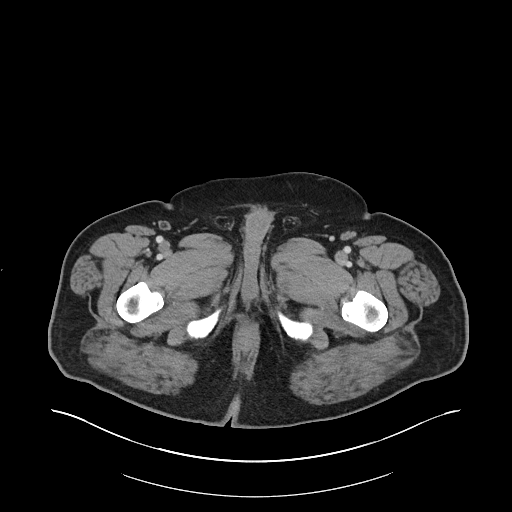
[im 7/111  bone]
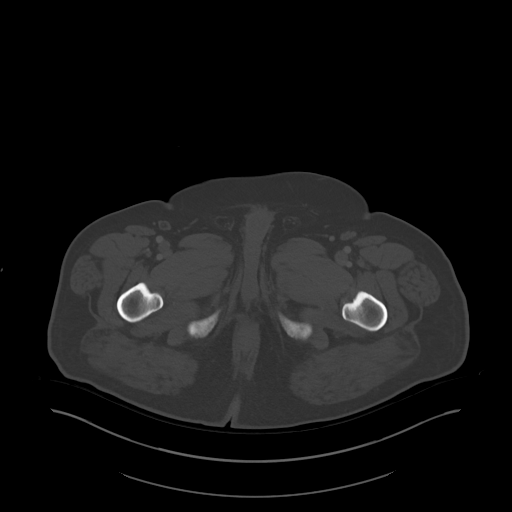
[im 20/111  soft-tissue]
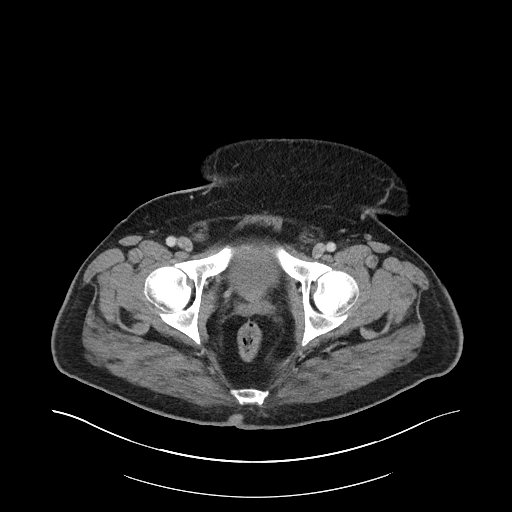
[im 26/111  soft-tissue]
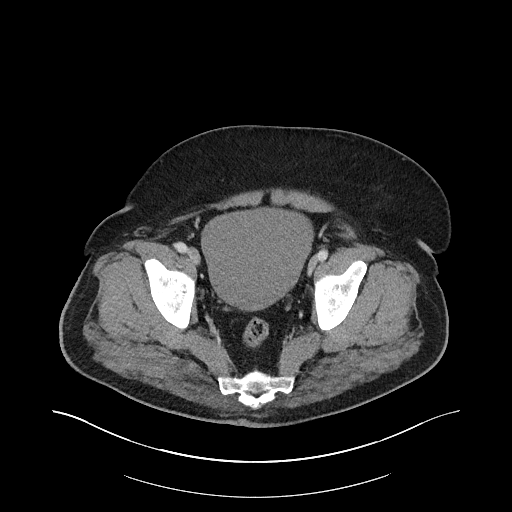
[im 33/111  soft-tissue]
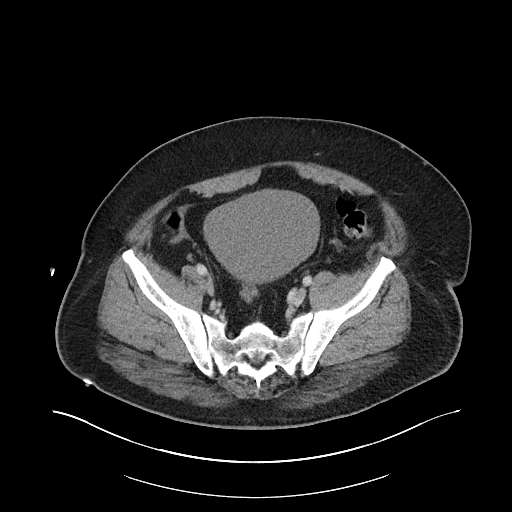
[im 46/111  soft-tissue]
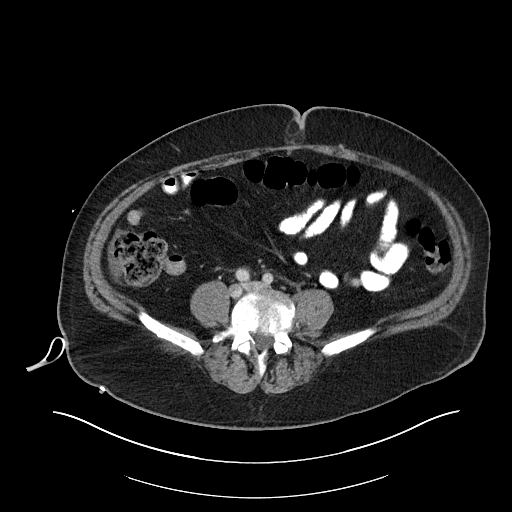
[im 52/111  soft-tissue]
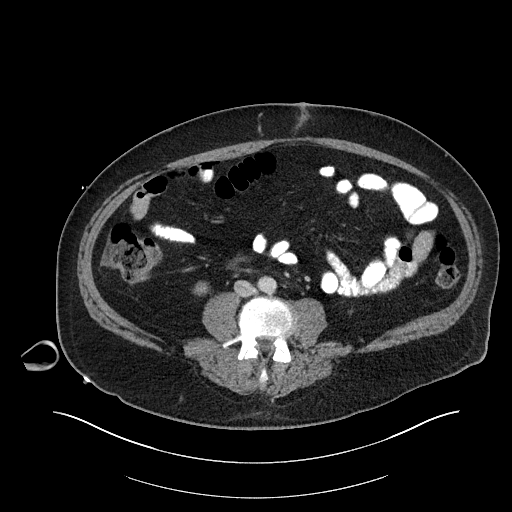
[im 59/111  soft-tissue]
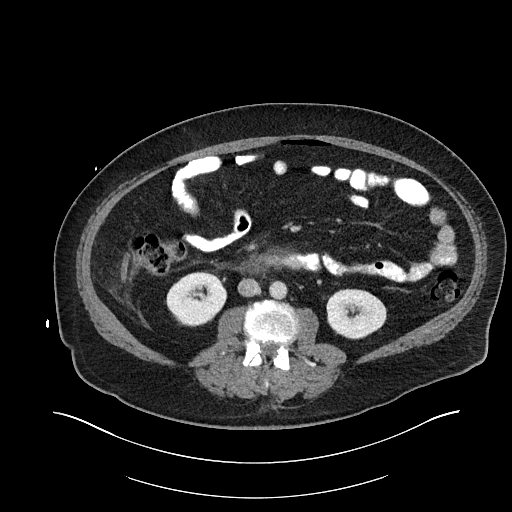
[im 72/111  soft-tissue]
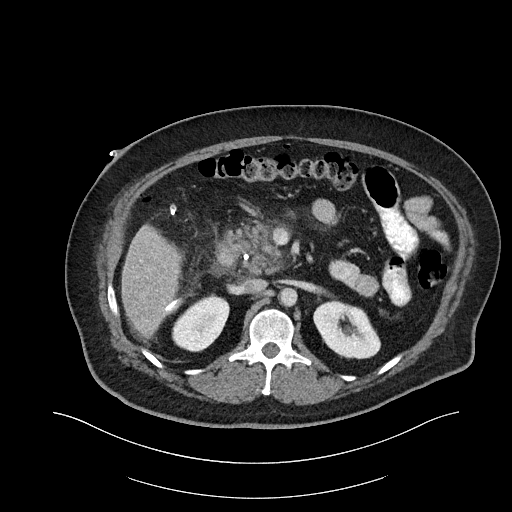
[im 78/111  soft-tissue]
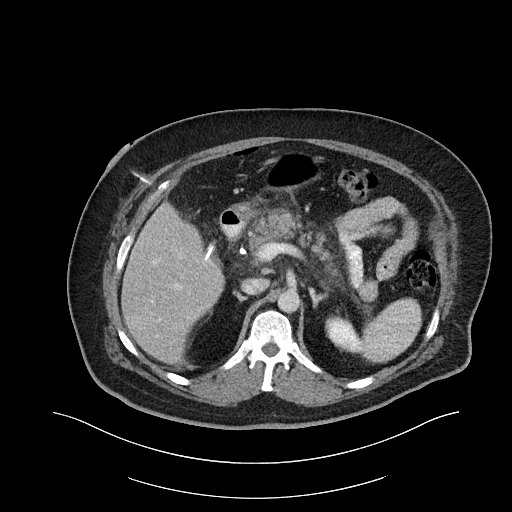
[im 78/111  bone]
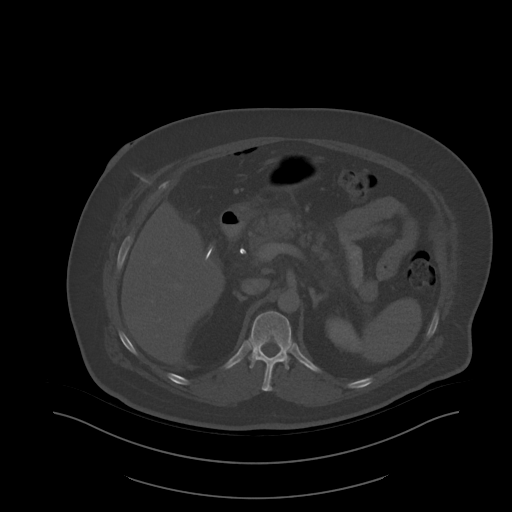
[im 85/111  soft-tissue]
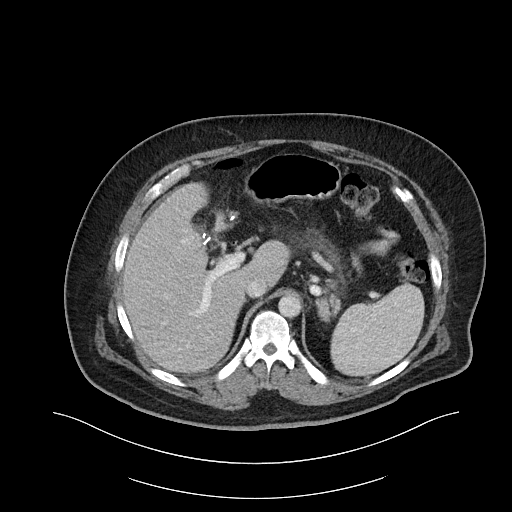
[im 98/111  soft-tissue]
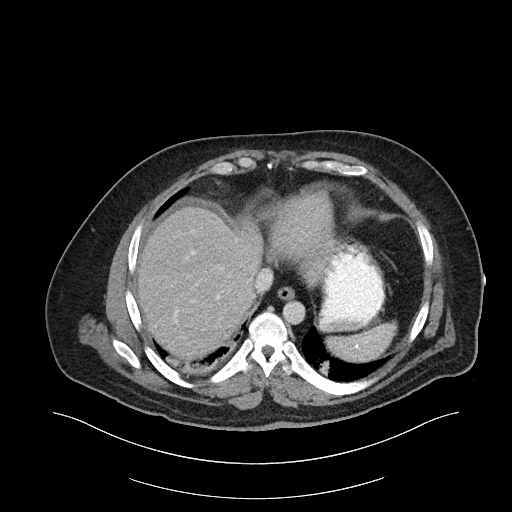
[im 104/111  soft-tissue]
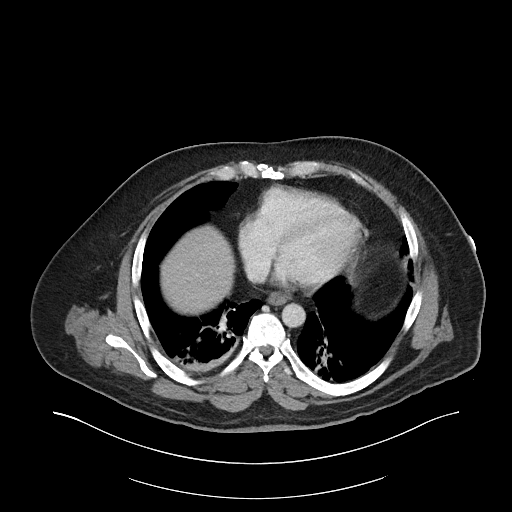

[Series 6: abdomen 3.0 mpr cor · coronal · 1.07mm/px · 3 of 90 slices shown]
[im 30/90  soft-tissue]
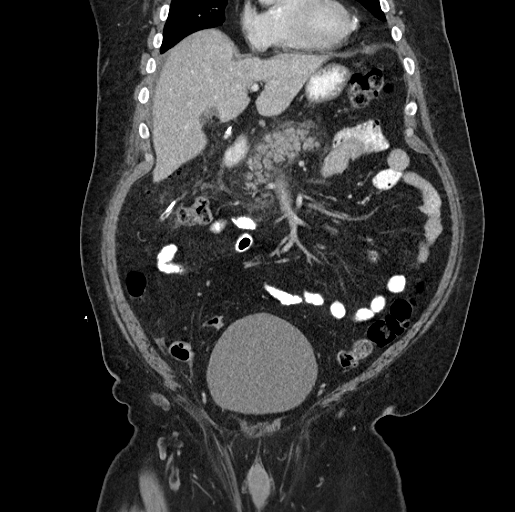
[im 40/90  soft-tissue]
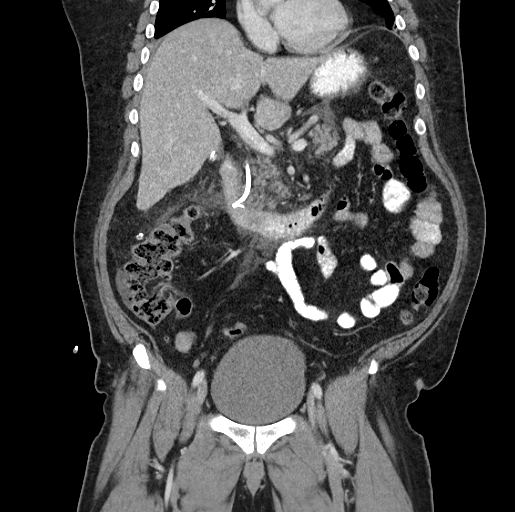
[im 50/90  soft-tissue]
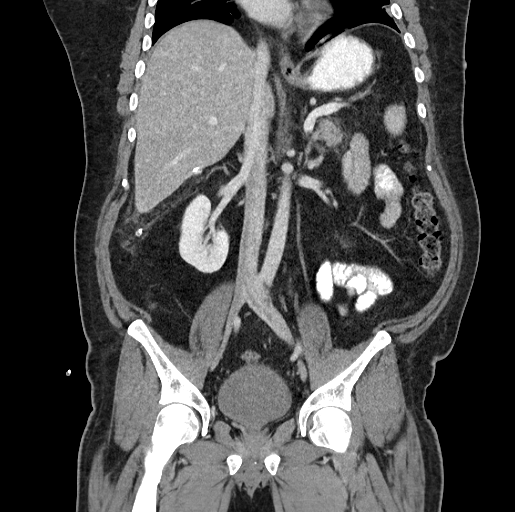

[15 of 46 positions shown; findings below may reference images not displayed]

FINDINGS: Lower chest: Considerable atelectasis in both lower lobes and the
lingula, new compared to 11/06/2016. Faint pericardial calcification
along the cardiac apex.

Hepatobiliary: No focal hepatic parenchymal lesion is identified.

Small amount of gas in the common hepatic duct. Biliary stent noted
with slight extension into the common hepatic duct but mostly in the
common bile duct and extending to the duodenum. Clips along the
gallbladder fossa near the cystic duct remnant. There is a small
amount of extraluminal gas adjacent to these clips, and a
percutaneous drainage catheter also tracks seen medially in this
vicinity.

Pancreas: Pancreatic and peripancreatic edema reason the possibility
of acute pancreatitis. No overt acute fluid collection or
pseudocyst. No abscess or necrosis. No dorsal pancreatic duct
dilatation.

Spleen: Scattered punctate calcifications compatible with old
granulomatous disease.

Adrenals/Urinary Tract: Unremarkable

Stomach/Bowel: Unremarkable

Vascular/Lymphatic: A porta hepatis lymph node measures 1.2 cm in
short axis on image 32/3 and previously measured 1.0 cm. A separate
adjacent lymph node is slightly enlarged at 1.0 cm on image 36/3.
These nodes are likely reactive.

Reproductive: Curvilinear calcifications centrally in the prostate
gland.

Other: Perihepatic ascites tracking in the right paracolic gutter.
There is free intraperitoneal gas scattered adjacent to the liver
but also free in the anterior upper abdomen for example on image 49/
3. This amount of gas is not typical for postoperative day 9 status
post cholecystectomy, but there is a residual drain in place in the
right abdomen. I do not see a compelling location for perforated
viscus.

Musculoskeletal: Unremarkable
IMPRESSION: 1. Perihepatic ascites may be due to the patient's reported bile
leak. There is a stent extending from the common hepatic duct into
the duodenum, and no current biliary dilatation.
2. There is a greater amount of free intraperitoneal gas than I
would expect for postop day 9 status post laparoscopic
cholecystectomy. However, there is an abdominal drain in place, and
drains can sometimes prolong pneumoperitoneum. I do not see discrete
abscesses although some of the gas is locular. I do not see any
dumping of contrast medium from the bowel to further suggest a
perforated viscus. Strictly speaking, I cannot exclude gas extending
from the duodenum through the stented common bile duct to leak from
the cystic duct remnant. Correlate with patient's overall clinical
progress.
3. Peripancreatic edema suspicious for acute pancreatitis.
4. Reactive porta hepatis lymph nodes.
5. Atelectasis in both lower lobes.
Radiology assistant personnel have been notified to put me in
telephone contact with the referring physician or the referring
physician's clinical representative in order to discuss these
findings. Once this communication is established I will issue an
addendum to this report for documentation purposes.

## 2019-01-21 ENCOUNTER — Other Ambulatory Visit: Payer: Self-pay

## 2019-01-21 ENCOUNTER — Ambulatory Visit (INDEPENDENT_AMBULATORY_CARE_PROVIDER_SITE_OTHER): Payer: 59 | Admitting: Family Medicine

## 2019-01-21 ENCOUNTER — Encounter: Payer: Self-pay | Admitting: Family Medicine

## 2019-01-21 DIAGNOSIS — F419 Anxiety disorder, unspecified: Secondary | ICD-10-CM | POA: Diagnosis not present

## 2019-01-21 DIAGNOSIS — E119 Type 2 diabetes mellitus without complications: Secondary | ICD-10-CM

## 2019-01-21 DIAGNOSIS — F32A Depression, unspecified: Secondary | ICD-10-CM

## 2019-01-21 DIAGNOSIS — F329 Major depressive disorder, single episode, unspecified: Secondary | ICD-10-CM | POA: Diagnosis not present

## 2019-01-21 MED ORDER — HYDROXYZINE PAMOATE 50 MG PO CAPS: ORAL_CAPSULE | ORAL | 2 refills | Status: DC

## 2019-01-21 MED ORDER — METFORMIN HCL 1000 MG PO TABS: ORAL_TABLET | ORAL | 4 refills | Status: DC

## 2019-01-21 NOTE — Progress Notes (Signed)
Virtual Visit via Video Note  I connected with Derrick West on 01/21/19 at  4:30 PM EDT by a video enabled telemedicine application and verified that I am speaking with the correct person using two identifiers.  Location patient: home Location provider:home office Persons participating in the virtual visit: patient, provider  I discussed the limitations of evaluation and management by telemedicine and the availability of in person appointments. The patient expressed understanding and agreed to proceed.   HPI: Pt seen for f/u.  Since last OFV pt has returned to work.  States he is doing the best he can.  Not getting enough sleep as up at 5 am and off of work by 7 pm.  Pt then helps his daughter with her school work which is typically due by midnight.  Pt states his wife is improving, off of life support.  She is now back in the [place] area, but given COVID-19 pandemic he is not able to visit her much.  At this time she is paralyzed from the waist down.  Pt has been seeing Dr. Davene Costain?, psychiatry.  He is currently on Sertraline 100 mg and trazadone.  Also taking hydroxyzine 50 mg which "takes the edgy off".    Pt notes eating more, gained weight.  Pt states his blood sugar has been good.  Pt riding his bike some for exercise.  Has not been taking metformin consistently.  ROS: See pertinent positives and negatives per HPI.  Past Medical History:  Diagnosis Date  . Anxiety and depression   . Cholecystitis   . DIABETES MELLITUS, TYPE II 12/24/2007  . Fatty liver   . GANGLION CYST, WRIST, LEFT 07/29/2007  . Headache(784.0)   . OBESITY 12/24/2007    Past Surgical History:  Procedure Laterality Date  . ANTERIOR CERVICAL DECOMP/DISCECTOMY FUSION N/A 04/15/2013   Procedure: ANTERIOR CERVICAL DECOMPRESSION/DISCECTOMY FUSION 1 LEVEL;  Surgeon: Winfield Cunas, MD;  Location: [facility];  Service: Neurosurgery;  Laterality: N/A;  Cervical Five-Six Anterior Cervical decompression with fusion plating  and bonegraft  . CHOLECYSTECTOMY N/A 09/05/2017   Procedure: LAPAROSCOPIC CHOLECYSTECTOMY;  Surgeon: Coralie Keens, MD;  Location: [facility];  Service: General;  Laterality: N/A;  . ERCP N/A 09/14/2017   Procedure: ENDOSCOPIC RETROGRADE CHOLANGIOPANCREATOGRAPHY (ERCP);  Surgeon: Gatha Mayer, MD;  Location[facility] END[facility];  Service: Endoscopy;  Laterality: N/A;  . ESOPHAGOGASTRODUODENOSCOPY (EGD) WITH PROPOFOL N/A 12/10/2017   Procedure: ESOPHAGOGASTRODUODENOSCOPY (EGD) WITH PROPOFOL;  Surgeon: Gatha Mayer, MD;  Location: [facility] ENDOSCOPY;  Service: Endoscopy;  Laterality: N/A;  . GASTROINTESTINAL STENT REMOVAL N/A 12/10/2017   Procedure: GASTROINTESTINAL STENT REMOVAL;  Surgeon: Gatha Mayer, MD;  Location: [facility] ENDOSCOPY;  Service: Endoscopy;  Laterality: N/A;  . LUMBAR LAMINECTOMY    . VSD REPAIR     age 43    Family History  Problem Relation Age of Onset  . Diabetes Mother   . Colon polyps Father     SOCIAL HX:    Current Outpatient Medications:  .  atorvastatin (LIPITOR) 10 MG tablet, Take 1 tablet (10 mg total) by mouth daily., Disp: 90 tablet, Rfl: 3 .  hydrOXYzine (ATARAX/VISTARIL) 25 MG tablet, Take 1 tablet (25 mg total) by mouth 3 (three) times daily as needed., Disp: 30 tablet, Rfl: 0 .  hydrOXYzine (VISTARIL) 50 MG capsule, TAKE 1 CAPSULE BY MOUTH 3 (THREE) TIMES DAILY AS NEEDED FOR ANXIETY., Disp: 60 capsule, Rfl: 1 .  ibuprofen (ADVIL,MOTRIN) 200 MG tablet, Take 400 mg by mouth every 6 (  six) hours as needed (for pain)., Disp: , Rfl:  .  metFORMIN (GLUCOPHAGE) 1000 MG tablet, TAKE 1 TABLET BY MOUTH 2 TIMES DAILY WITH A MEAL., Disp: 180 tablet, Rfl: 4 .  ONETOUCH VERIO test strip, USE TO TEST BLOOD SUGAR ONCE DAILY, Disp: 100 each, Rfl: 4 .  triamcinolone cream (KENALOG) 0.1 %, Apply 1 application topically 2 (two) times daily., Disp: 30 g, Rfl: 0  EXAM:  VITALS per patient if applicable:  RR between 12-20 bpm  GENERAL: alert, oriented, appears well and in no acute  distress  HEENT: atraumatic, conjunctiva clear, no obvious abnormalities on inspection of external nose and ears  NECK: normal movements of the head and neck  LUNGS: on inspection no signs of respiratory distress, breathing rate appears normal, no obvious gross SOB, gasping or wheezing  CV: no obvious cyanosis  MS: moves all visible extremities without noticeable abnormality  PSYCH/NEURO: pleasant and cooperative, no obvious depression or anxiety, speech and thought processing grossly intact  ASSESSMENT AND PLAN:  Discussed the following assessment and plan:  Anxiety and depression  -continue f/u with Psychiatry, Dr. Davene Costain? - Plan: hydrOXYzine (VISTARIL) 50 MG capsule  Diabetes mellitus without complication ([place]) -pt encouraged to increase physical activity when able and to make better food choices when snacking.  - Plan: metFORMIN (GLUCOPHAGE) 1000 MG tablet  Will f/u in 1 month, sooner if needed.   I discussed the assessment and treatment plan with the patient. The patient was provided an opportunity to ask questions and all were answered. The patient agreed with the plan and demonstrated an understanding of the instructions.   The patient was advised to call back or seek an in-person evaluation if the symptoms worsen or if the condition fails to improve as anticipated.   Billie Ruddy, MD

## 2019-01-26 ENCOUNTER — Telehealth: Payer: Self-pay | Admitting: Family Medicine

## 2019-01-26 ENCOUNTER — Other Ambulatory Visit: Payer: Self-pay

## 2019-01-26 MED ORDER — TRIAMCINOLONE ACETONIDE 0.1 % EX CREA: 1.0000 "application " | TOPICAL_CREAM | Freq: Two times a day (BID) | CUTANEOUS | 0 refills | Status: DC

## 2019-01-26 NOTE — Telephone Encounter (Signed)
Rx has been sent to pt pharmacy.  

## 2019-01-26 NOTE — Telephone Encounter (Signed)
Copied from [place] [id number]. Topic: Quick Communication - Rx Refill/Question >> Jan 26, 2019 12:51 PM Selinda Flavin B, NT wrote: Medication: triamcinolone cream (KENALOG) 0.1 %  Has the patient contacted their pharmacy? yes (Agent: If no, request that the patient contact the pharmacy for the refill.) (Agent: If yes, when and what did the pharmacy advise?)  Preferred Pharmacy (with phone number or street name): [facility] [place], [place] - [address]  Agent: Please be advised that RX refills may take up to 3 business days. We ask that you follow-up with your pharmacy.

## 2019-06-08 ENCOUNTER — Other Ambulatory Visit: Payer: Self-pay

## 2019-06-08 MED ORDER — ATORVASTATIN CALCIUM 10 MG PO TABS: 10.0000 mg | ORAL_TABLET | Freq: Every day | ORAL | 3 refills | Status: DC

## 2019-06-19 ENCOUNTER — Ambulatory Visit: Payer: 59 | Admitting: Family Medicine

## 2019-09-21 ENCOUNTER — Other Ambulatory Visit: Payer: Self-pay

## 2019-09-21 MED ORDER — ONETOUCH VERIO VI STRP: ORAL_STRIP | 0 refills | Status: DC

## 2019-12-13 ENCOUNTER — Other Ambulatory Visit: Payer: Self-pay | Admitting: Family Medicine

## 2019-12-15 ENCOUNTER — Other Ambulatory Visit: Payer: Self-pay | Admitting: Family Medicine

## 2019-12-15 MED ORDER — TRIAMCINOLONE ACETONIDE 0.1 % EX CREA: 1.0000 "application " | TOPICAL_CREAM | Freq: Two times a day (BID) | CUTANEOUS | 0 refills | Status: DC

## 2019-12-17 ENCOUNTER — Ambulatory Visit (INDEPENDENT_AMBULATORY_CARE_PROVIDER_SITE_OTHER): Payer: 59 | Admitting: Family Medicine

## 2019-12-17 ENCOUNTER — Encounter: Payer: Self-pay | Admitting: Family Medicine

## 2019-12-17 ENCOUNTER — Other Ambulatory Visit: Payer: Self-pay

## 2019-12-17 VITALS — BP 118/78 | HR 88 | Temp 98.0°F | Wt 242.0 lb

## 2019-12-17 DIAGNOSIS — F419 Anxiety disorder, unspecified: Secondary | ICD-10-CM

## 2019-12-17 DIAGNOSIS — E119 Type 2 diabetes mellitus without complications: Secondary | ICD-10-CM

## 2019-12-17 DIAGNOSIS — E66812 Obesity, class 2: Secondary | ICD-10-CM

## 2019-12-17 DIAGNOSIS — Z0000 Encounter for general adult medical examination without abnormal findings: Secondary | ICD-10-CM | POA: Diagnosis not present

## 2019-12-17 DIAGNOSIS — F329 Major depressive disorder, single episode, unspecified: Secondary | ICD-10-CM

## 2019-12-17 DIAGNOSIS — Z6837 Body mass index (BMI) 37.0-37.9, adult: Secondary | ICD-10-CM | POA: Diagnosis not present

## 2019-12-17 DIAGNOSIS — F32A Depression, unspecified: Secondary | ICD-10-CM

## 2019-12-17 LAB — LIPID PANEL
Cholesterol: 172 mg/dL (ref 0–200)
HDL: 33.1 mg/dL — ABNORMAL LOW (ref >39.00)
LDL Cholesterol: 104 mg/dL — ABNORMAL HIGH (ref 0–99)
NonHDL: 138.59
Total CHOL/HDL Ratio: 5
Triglycerides: 172 mg/dL — ABNORMAL HIGH (ref 0.0–149.0)
VLDL: 34.4 mg/dL (ref 0.0–40.0)

## 2019-12-17 LAB — CBC WITH DIFFERENTIAL/PLATELET
Basophils Absolute: 0.1 10*3/uL (ref 0.0–0.1)
Basophils Relative: 0.8 % (ref 0.0–3.0)
Eosinophils Absolute: 0.2 10*3/uL (ref 0.0–0.7)
Eosinophils Relative: 1.8 % (ref 0.0–5.0)
HCT: 43 % (ref 39.0–52.0)
Hemoglobin: 14.9 g/dL (ref 13.0–17.0)
Lymphocytes Relative: 25.5 % (ref 12.0–46.0)
Lymphs Abs: 2.3 10*3/uL (ref 0.7–4.0)
MCHC: 34.6 g/dL (ref 30.0–36.0)
MCV: 84.3 fl (ref 78.0–100.0)
Monocytes Absolute: 0.6 10*3/uL (ref 0.1–1.0)
Monocytes Relative: 7.1 % (ref 3.0–12.0)
Neutro Abs: 5.8 10*3/uL (ref 1.4–7.7)
Neutrophils Relative %: 64.8 % (ref 43.0–77.0)
Platelets: 282 10*3/uL (ref 150.0–400.0)
RBC: 5.1 Mil/uL (ref 4.22–5.81)
RDW: 13.7 % (ref 11.5–15.5)
WBC: 9 10*3/uL (ref 4.0–10.5)

## 2019-12-17 LAB — BASIC METABOLIC PANEL
BUN: 15 mg/dL (ref 6–23)
CO2: 29 mEq/L (ref 19–32)
Calcium: 9.7 mg/dL (ref 8.4–10.5)
Chloride: 102 mEq/L (ref 96–112)
Creatinine, Ser: 0.9 mg/dL (ref 0.40–1.50)
GFR: 90.82 mL/min (ref >60.00)
Glucose, Bld: 115 mg/dL — ABNORMAL HIGH (ref 70–99)
Potassium: 4.2 mEq/L (ref 3.5–5.1)
Sodium: 137 mEq/L (ref 135–145)

## 2019-12-17 LAB — HEMOGLOBIN A1C: Hgb A1c MFr Bld: 9.2 % — ABNORMAL HIGH (ref 4.6–6.5)

## 2019-12-17 LAB — MICROALBUMIN / CREATININE URINE RATIO
Creatinine,U: 122.2 mg/dL
Microalb Creat Ratio: 0.8 mg/g (ref 0.0–30.0)
Microalb, Ur: 1 mg/dL (ref 0.0–1.9)

## 2019-12-17 MED ORDER — METFORMIN HCL 1000 MG PO TABS: ORAL_TABLET | ORAL | 4 refills | Status: DC

## 2019-12-17 NOTE — Progress Notes (Signed)
Subjective:     Derrick West is a 44 y.o. male and is here for a comprehensive physical exam. The patient reports problems - anxiety.  Pt's wife died in 12-17-22 after several bouts with sepsis.  Pt states he is doing ok, dealing with it one day at a time.  Pt's daughter is doing ok.  Pt notes he has not been keeping up with his bs and has gained some weight.  Pt seeing Psychiatry for anxiety and depression.  States his mind races all night unless he takes Trazodone 100 mg.  Also on Sertraline 100 mg, but not sure if it is helping.  Pt planning a trip to [place] soon.  Social History   Socioeconomic History  . Marital status: Widowed    Spouse name: Not on file  . Number of children: 1  . Years of education: Not on file  . Highest education level: Not on file  Occupational History  . Occupation: tech (shift leader)  Tobacco Use  . Smoking status: Never Smoker  . Smokeless tobacco: Never Used  Substance and Sexual Activity  . Alcohol use: Yes    Comment: occ  . Drug use: No  . Sexual activity: Not on file  Other Topics Concern  . Not on file  Social History Narrative  . Not on file   Social Determinants of Health   Financial Resource Strain:   . Difficulty of Paying Living Expenses:   Food Insecurity:   . Worried About Charity fundraiser in the Last Year:   . Arboriculturist in the Last Year:   Transportation Needs:   . Film/video editor (Medical):   Marland Kitchen Lack of Transportation (Non-Medical):   Physical Activity:   . Days of Exercise per Week:   . Minutes of Exercise per Session:   Stress:   . Feeling of Stress :   Social Connections:   . Frequency of Communication with Friends and Family:   . Frequency of Social Gatherings with Friends and Family:   . Attends Religious Services:   . Active Member of Clubs or Organizations:   . Attends Archivist Meetings:   Marland Kitchen Marital Status:   Intimate Partner Violence:   . Fear of Current or Ex-Partner:   .  Emotionally Abused:   Marland Kitchen Physically Abused:   . Sexually Abused:    Health Maintenance  Topic Date Due  . PNEUMOCOCCAL POLYSACCHARIDE VACCINE AGE 12[birth year]17[birth year]62 HIGH RISK  Never done  . TETANUS/TDAP  Never done  . OPHTHALMOLOGY EXAM  09/22/2011  . URINE MICROALBUMIN  11/19/2017  . HEMOGLOBIN A1C  10/02/2018  . FOOT EXAM  04/02/2019  . INFLUENZA VACCINE  04/25/2019  . HIV Screening  Completed    The following portions of the patient's history were reviewed and updated as appropriate: allergies, current medications, past family history, past medical history, past social history, past surgical history and problem list.  Review of Systems Pertinent items noted in HPI and remainder of comprehensive ROS otherwise negative.   Objective:    BP 118/78 (BP Location: Left Arm, Patient Position: Sitting, Cuff Size: Large)   Pulse 88   Temp 98 F (36.7 C) (Temporal)   Wt 242 lb (109.8 kg)   SpO2 97%   BMI 37.90 kg/m  General appearance: alert, cooperative and no distress Head: Normocephalic, without obvious abnormality, atraumatic Eyes: conjunctivae/corneas clear. PERRL, EOM's intact. Fundi benign. Ears: normal TM's and external ear canals both ears Nose: Nares normal. Septum  midline. Mucosa normal. No drainage or sinus tenderness. Throat: lips, mucosa, and tongue normal; teeth and gums normal Neck: no adenopathy, no carotid bruit, no JVD, supple, symmetrical, trachea midline and thyroid not enlarged, symmetric, no tenderness/mass/nodules Lungs: clear to auscultation bilaterally Heart: regular rate and rhythm, S1, S2 normal, no murmur, click, rub or gallop Abdomen: soft, non-tender; bowel sounds normal; no masses,  no organomegaly Extremities: extremities normal, atraumatic, no cyanosis or edema Pulses: 2+ and symmetric Skin: Skin color, texture, turgor normal. No rashes or lesions Lymph nodes: Cervical, supraclavicular, and axillary nodes normal. Neurologic: Alert and oriented X 3, normal  strength and tone. Normal symmetric reflexes. Normal coordination and gait    Diabetic Foot Exam - Simple   Simple Foot Form Diabetic Foot exam was performed with the following findings: Yes 12/17/2019  2:56 PM  Visual Inspection No deformities, no ulcerations, no other skin breakdown bilaterally: Yes Sensation Testing Intact to touch and monofilament testing bilaterally: Yes Pulse Check Posterior Tibialis and Dorsalis pulse intact bilaterally: Yes Comments     Assessment:    Healthy male exam.      Plan:     Anticipatory guidance given including wearing seatbelts, smoke detectors in the home, increasing physical activity, increasing p.o. intake of water and vegetables. -will obtain labs -given handout -next cpe in 1 yr See After Visit Summary for Counseling Recommendations    Diabetes mellitus without complication ([place])  -Continue lifestyle modifications -Discussed continue checking FSBS at home -Foot exam done this visit -Eye exam needed. -Continue Metformin 1000 mg twice daily - Plan: Hemoglobin A1c, Lipid panel, Microalbumin/Creatinine Ratio, Urine, metFORMIN (GLUCOPHAGE) 1000 MG tablet  Class 2 severe obesity due to excess calories with serious comorbidity and body mass index (BMI) of 37.0 to 37.9 in adult [facility])  -Discussed lifestyle modifications including increasing physical activity - Plan: Lipid panel  Anxiety and depression -Also dealing with grief -Continue sertraline 100 mg and trazodone 100 mg nightly -Continue follow-up with psychiatry -Given precautions -follow up in 1 month, sooner if needed  F/u in 1 month for grief, anxiety, depression.  Grier Mitts, MD

## 2019-12-17 NOTE — Patient Instructions (Signed)
Preventive Care 41-46 Years Old, Male Preventive care refers to lifestyle choices and visits with your health care provider that can promote health and wellness. This includes:  A yearly physical exam. This is also called an annual well check.  Regular dental and eye exams.  Immunizations.  Screening for certain conditions.  Healthy lifestyle choices, such as eating a healthy diet, getting regular exercise, not using drugs or products that contain nicotine and tobacco, and limiting alcohol use. What can I expect for my preventive care visit? Physical exam Your health care provider will check:  Height and weight. These may be used to calculate body mass index (BMI), which is a measurement that tells if you are at a healthy weight.  Heart rate and blood pressure.  Your skin for abnormal spots. Counseling Your health care provider may ask you questions about:  Alcohol, tobacco, and drug use.  Emotional well-being.  Home and relationship well-being.  Sexual activity.  Eating habits.  Work and work Statistician. What immunizations do I need?  Influenza (flu) vaccine  This is recommended every year. Tetanus, diphtheria, and pertussis (Tdap) vaccine  You may need a Td booster every 10 years. Varicella (chickenpox) vaccine  You may need this vaccine if you have not already been vaccinated. Zoster (shingles) vaccine  You may need this after age 64. Measles, mumps, and rubella (MMR) vaccine  You may need at least one dose of MMR if you were born in 1957 or later. You may also need a second dose. Pneumococcal conjugate (PCV13) vaccine  You may need this if you have certain conditions and were not previously vaccinated. Pneumococcal polysaccharide (PPSV23) vaccine  You may need one or two doses if you smoke cigarettes or if you have certain conditions. Meningococcal conjugate (MenACWY) vaccine  You may need this if you have certain conditions. Hepatitis A  vaccine  You may need this if you have certain conditions or if you travel or work in places where you may be exposed to hepatitis A. Hepatitis B vaccine  You may need this if you have certain conditions or if you travel or work in places where you may be exposed to hepatitis B. Haemophilus influenzae type b (Hib) vaccine  You may need this if you have certain risk factors. Human papillomavirus (HPV) vaccine  If recommended by your health care provider, you may need three doses over 6 months. You may receive vaccines as individual doses or as more than one vaccine together in one shot (combination vaccines). Talk with your health care provider about the risks and benefits of combination vaccines. What tests do I need? Blood tests  Lipid and cholesterol levels. These may be checked every 5 years, or more frequently if you are over 60 years old.  Hepatitis C test.  Hepatitis B test. Screening  Lung cancer screening. You may have this screening every year starting at age 43 if you have a 30-pack-year history of smoking and currently smoke or have quit within the past 15 years.  Prostate cancer screening. Recommendations will vary depending on your family history and other risks.  Colorectal cancer screening. All adults should have this screening starting at age 72 and continuing until age 2. Your health care provider may recommend screening at age 14 if you are at increased risk. You will have tests every 1-10 years, depending on your results and the type of screening test.  Diabetes screening. This is done by checking your blood sugar (glucose) after you have not eaten  for a while (fasting). You may have this done every 1-3 years.  Sexually transmitted disease (STD) testing. Follow these instructions at home: Eating and drinking  Eat a diet that includes fresh fruits and vegetables, whole grains, lean protein, and low-fat dairy products.  Take vitamin and mineral supplements as  recommended by your health care provider.  Do not drink alcohol if your health care provider tells you not to drink.  If you drink alcohol: ? Limit how much you have to 0-2 drinks a day. ? Be aware of how much alcohol is in your drink. In the [place] one drink equals one 12 oz bottle of beer (355 mL), one 5 oz glass of wine (148 mL), or one 1 oz glass of hard liquor (44 mL). Lifestyle  Take daily care of your teeth and gums.  Stay active. Exercise for at least 30 minutes on 5 or more days each week.  Do not use any products that contain nicotine or tobacco, such as cigarettes, e-cigarettes, and chewing tobacco. If you need help quitting, ask your health care provider.  If you are sexually active, practice safe sex. Use a condom or other form of protection to prevent STIs (sexually transmitted infections).  Talk with your health care provider about taking a low-dose aspirin every day starting at age 80. What's next?  Go to your health care provider once a year for a well check visit.  Ask your health care provider how often you should have your eyes and teeth checked.  Stay up to date on all vaccines. This information is not intended to replace advice given to you by your health care provider. Make sure you discuss any questions you have with your health care provider. Document Revised: 09/04/2018 Document Reviewed: 09/04/2018 Elsevier Patient Education  2020 [place] With Diabetes Diabetes (type 1 diabetes mellitus or type 2 diabetes mellitus) is a condition in which the body does not have enough of a hormone called insulin, or the body does not respond properly to insulin. Normally, insulin allows sugars (glucose) to enter cells in the body. The cells use glucose for energy. With diabetes, extra glucose builds up in the blood instead of going into cells, which results in high blood glucose (hyperglycemia). How to manage lifestyle changes Managing diabetes includes  medical treatments as well as lifestyle changes. If diabetes is not managed well, serious physical and emotional complications can occur. Taking good care of yourself means that you are responsible for:  Monitoring glucose regularly.  Eating a healthy diet.  Exercising regularly.  Meeting with health care providers.  Taking medicines as directed. Some people may feel a lot of stress about managing their diabetes. This is known as emotional distress, and it is very common. Living with diabetes can place you at risk for emotional distress, depression, or anxiety. These disorders can be confusing and can make diabetes management more difficult. How to recognize stress Emotional distress Symptoms of emotional distress include:  Anger about having a diagnosis of diabetes.  Fear or frustration about your diagnosis and the changes you need to make to manage the condition.  Being overly worried about the care that you need or the cost of the care that you need.  Feeling like you caused your condition by doing something wrong.  Fear of unpredictable situations, like low or high blood glucose.  Feeling judged by your health care providers.  Feeling very alone with the disease.  Getting too tired or worn out with  the demands of daily care. Depression Having diabetes means that you are at a higher risk for depression. Having depression also means that you are at a higher risk for diabetes. Your health care provider may test (screen) you for symptoms of depression. It is important to recognize depression symptoms and to start treatment for depression soon after it is diagnosed. The following are some symptoms of depression:  Loss of interest in things that you used to enjoy.  Trouble sleeping, or often waking up early and not being able to get back to sleep.  A change in appetite.  Feeling tired most of the day.  Feeling nervous and anxious.  Feeling guilty and worrying that you are a  burden to others.  Feeling depressed more often than you do not feel that way.  Thoughts of hurting yourself or feeling that you want to die. If you have any of these symptoms for 2 weeks or longer, reach out to a health care provider. Follow these instructions at home: Managing emotional distress The following are some ways to manage emotional distress:  Talk with your health care provider or certified diabetes educator. Consider working with a counselor or therapist.  Learn as much as you can about diabetes and its treatment. Meet with a certified diabetes educator or take a class to learn how to manage your condition.  Keep a journal of your thoughts and concerns.  Accept that some things are out of your control.  Talk with other people who have diabetes. It can help to talk with others about the emotional distress that you feel.  Find ways to manage stress that work for you. These may include art or music therapy, exercise, meditation, and hobbies.  Seek support from spiritual leaders, family, and friends. General instructions  Follow your diabetes management plan.  Keep all follow-up visits as told by your health care provider. This is important. Where to find support   Ask your health care provider to recommend a therapist who understands both depression and diabetes.  Search for information and support from the American Diabetes Association: www.diabetes.org  Find a certified diabetes educator and make an appointment through [place] of Diabetes Educators: www.diabeteseducator.org Get help right away if:  You have thoughts about hurting yourself or others. If you ever feel like you may hurt yourself or others, or have thoughts about taking your own life, get help right away. You can go to your nearest emergency department or call:  Your local emergency services (911 in the [place]  A suicide crisis helpline, such as the [place]  at 702-158-3887. This is open 24 hours a day. Summary  Diabetes (type 1 diabetes mellitus or type 2 diabetes mellitus) is a condition in which the body does not have enough of a hormone called insulin, or the body does not respond properly to insulin.  Living with diabetes puts you at risk for medical issues, and it also puts you at risk for emotional issues such as emotional distress, depression, and anxiety.  Recognizing the symptoms of emotional distress and depression may help you avoid problems with your diabetes control. It is important to start treatment for emotional distress and depression soon after they are diagnosed.  Having diabetes means that you are at a higher risk for depression. Ask your health care provider to recommend a therapist who understands both depression and diabetes.  If you experience symptoms of emotional distress or depression, it is important to discuss this with your  health care provider, certified diabetes educator, or therapist. This information is not intended to replace advice given to you by your health care provider. Make sure you discuss any questions you have with your health care provider. Document Revised: 09/22/2018 Document Reviewed: 01/24/2017 Elsevier Patient Education  [place].  Diabetes Mellitus and Exercise Exercising regularly is important for your overall health, especially when you have diabetes (diabetes mellitus). Exercising is not only about losing weight. It has many other health benefits, such as increasing muscle strength and bone density and reducing body fat and stress. This leads to improved fitness, flexibility, and endurance, all of which result in better overall health. Exercise has additional benefits for people with diabetes, including:  Reducing appetite.  Helping to lower and control blood glucose.  Lowering blood pressure.  Helping to control amounts of fatty substances (lipids) in the blood, such as  cholesterol and triglycerides.  Helping the body to respond better to insulin (improving insulin sensitivity).  Reducing how much insulin the body needs.  Decreasing the risk for heart disease by: ? Lowering cholesterol and triglyceride levels. ? Increasing the levels of good cholesterol. ? Lowering blood glucose levels. What is my activity plan? Your health care provider or certified diabetes educator can help you make a plan for the type and frequency of exercise (activity plan) that works for you. Make sure that you:  Do at least 150 minutes of moderate-intensity or vigorous-intensity exercise each week. This could be brisk walking, biking, or water aerobics. ? Do stretching and strength exercises, such as yoga or weightlifting, at least 2 times a week. ? Spread out your activity over at least 3 days of the week.  Get some form of physical activity every day. ? Do not go more than 2 days in a row without some kind of physical activity. ? Avoid being inactive for more than 30 minutes at a time. Take frequent breaks to walk or stretch.  Choose a type of exercise or activity that you enjoy, and set realistic goals.  Start slowly, and gradually increase the intensity of your exercise over time. What do I need to know about managing my diabetes?   Check your blood glucose before and after exercising. ? If your blood glucose is 240 mg/dL (13.3 mmol/L) or higher before you exercise, check your urine for ketones. If you have ketones in your urine, do not exercise until your blood glucose returns to normal. ? If your blood glucose is 100 mg/dL (5.6 mmol/L) or lower, eat a snack containing 15-20 grams of carbohydrate. Check your blood glucose 15 minutes after the snack to make sure that your level is above 100 mg/dL (5.6 mmol/L) before you start your exercise.  Know the symptoms of low blood glucose (hypoglycemia) and how to treat it. Your risk for hypoglycemia increases during and after  exercise. Common symptoms of hypoglycemia can include: ? Hunger. ? Anxiety. ? Sweating and feeling clammy. ? Confusion. ? Dizziness or feeling light-headed. ? Increased heart rate or palpitations. ? Blurry vision. ? Tingling or numbness around the mouth, lips, or tongue. ? Tremors or shakes. ? Irritability.  Keep a rapid-acting carbohydrate snack available before, during, and after exercise to help prevent or treat hypoglycemia.  Avoid injecting insulin into areas of the body that are going to be exercised. For example, avoid injecting insulin into: ? The arms, when playing tennis. ? The legs, when jogging.  Keep records of your exercise habits. Doing this can help you and your  health care provider adjust your diabetes management plan as needed. Write down: ? Food that you eat before and after you exercise. ? Blood glucose levels before and after you exercise. ? The type and amount of exercise you have done. ? When your insulin is expected to peak, if you use insulin. Avoid exercising at times when your insulin is peaking.  When you start a new exercise or activity, work with your health care provider to make sure the activity is safe for you, and to adjust your insulin, medicines, or food intake as needed.  Drink plenty of water while you exercise to prevent dehydration or heat stroke. Drink enough fluid to keep your urine clear or pale yellow. Summary  Exercising regularly is important for your overall health, especially when you have diabetes (diabetes mellitus).  Exercising has many health benefits, such as increasing muscle strength and bone density and reducing body fat and stress.  Your health care provider or certified diabetes educator can help you make a plan for the type and frequency of exercise (activity plan) that works for you.  When you start a new exercise or activity, work with your health care provider to make sure the activity is safe for you, and to adjust your  insulin, medicines, or food intake as needed. This information is not intended to replace advice given to you by your health care provider. Make sure you discuss any questions you have with your health care provider. Document Revised: 04/04/2017 Document Reviewed: 02/20/2016 Elsevier Patient Education  [place].

## 2019-12-18 ENCOUNTER — Ambulatory Visit: Payer: 59

## 2020-01-26 LAB — HM DIABETES EYE EXAM

## 2020-01-29 ENCOUNTER — Encounter: Payer: Self-pay | Admitting: Family Medicine

## 2020-03-02 ENCOUNTER — Ambulatory Visit: Payer: 59 | Attending: Internal Medicine

## 2020-03-02 DIAGNOSIS — Z20822 Contact with and (suspected) exposure to covid-19: Secondary | ICD-10-CM

## 2020-03-03 LAB — NOVEL CORONAVIRUS, NAA: SARS-CoV-2, NAA: NOT DETECTED

## 2020-03-03 LAB — SARS-COV-2, NAA 2 DAY TAT

## 2020-03-17 ENCOUNTER — Ambulatory Visit: Payer: 59 | Admitting: Family Medicine

## 2020-03-18 ENCOUNTER — Encounter: Payer: Self-pay | Admitting: Family Medicine

## 2020-03-21 ENCOUNTER — Ambulatory Visit: Payer: 59 | Admitting: Family Medicine

## 2020-04-04 ENCOUNTER — Ambulatory Visit: Payer: 59 | Admitting: Family Medicine

## 2020-07-28 ENCOUNTER — Other Ambulatory Visit: Payer: Self-pay

## 2020-07-28 ENCOUNTER — Ambulatory Visit: Payer: 59 | Admitting: Family Medicine

## 2020-07-28 ENCOUNTER — Encounter: Payer: Self-pay | Admitting: Family Medicine

## 2020-07-28 VITALS — BP 139/90 | HR 94 | Temp 98.0°F | Wt 245.6 lb

## 2020-07-28 DIAGNOSIS — E119 Type 2 diabetes mellitus without complications: Secondary | ICD-10-CM | POA: Diagnosis not present

## 2020-07-28 DIAGNOSIS — F419 Anxiety disorder, unspecified: Secondary | ICD-10-CM | POA: Insufficient documentation

## 2020-07-28 DIAGNOSIS — F321 Major depressive disorder, single episode, moderate: Secondary | ICD-10-CM | POA: Diagnosis not present

## 2020-07-28 DIAGNOSIS — I10 Essential (primary) hypertension: Secondary | ICD-10-CM | POA: Diagnosis not present

## 2020-07-28 LAB — POCT GLYCOSYLATED HEMOGLOBIN (HGB A1C): Hemoglobin A1C: 8.7 % — AB (ref 4.0–5.6)

## 2020-07-28 LAB — POCT GLUCOSE (DEVICE FOR HOME USE): Glucose Fasting, POC: 238 mg/dL — AB (ref 70–99)

## 2020-07-28 MED ORDER — SERTRALINE HCL 50 MG PO TABS: ORAL_TABLET | ORAL | 3 refills | Status: DC

## 2020-07-28 MED ORDER — LISINOPRIL 5 MG PO TABS: 5.0000 mg | ORAL_TABLET | Freq: Every day | ORAL | 3 refills | Status: DC

## 2020-07-28 MED ORDER — GLIPIZIDE 5 MG PO TABS: 5.0000 mg | ORAL_TABLET | Freq: Two times a day (BID) | ORAL | 3 refills | Status: DC

## 2020-07-28 MED ORDER — LISINOPRIL 10 MG PO TABS: 10.0000 mg | ORAL_TABLET | Freq: Every day | ORAL | 3 refills | Status: DC

## 2020-07-28 NOTE — Progress Notes (Signed)
Subjective:    Patient ID: Derrick West, male    DOB: 1976-02-07, 44 y.o.   MRN: [id number]  No chief complaint on file.   HPI Pt is a 44 year old male with past medical history significant for anxiety, depression, DM 2, fatty liver, obesity, history of pancreatitis who was seen for acute concern.  Pt with hyperglycemia times several days.  Pt endorses feeling fatigued, tired, increased hunger, and a "fog".  Pt seen by nurse at work, bs 183 and 322, BP 150/100, 130/98, 130/90.  Patient taking Metformin 1000 mg twice daily.  Checking blood sugar every morning.  Patient endorses eating fast food most nights for dinner.  Patient notes increased feelings of depression or anxiety.  Currently on sertraline 100 mg daily x1 year the death of his wife.  Patient does not feel like the medication is helping.  Endorses having good days and bad days.  Patient with decreased enjoyment in activities.  States he does not want his daughter to worry about his health.  Past Medical History:  Diagnosis Date  . Anxiety and depression   . Cholecystitis   . DIABETES MELLITUS, TYPE II 12/24/2007  . Fatty liver   . GANGLION CYST, WRIST, LEFT 07/29/2007  . Headache(784.0)   . OBESITY 12/24/2007    Allergies  Allergen Reactions  . Amoxicillin Other (See Comments)    From childhood; reaction not recalled    ROS General: Denies fever, chills, night sweats, changes in weight, changes in appetite   + increased hunger, fatigue HEENT: Denies headaches, ear pain, changes in vision, rhinorrhea, sore throat CV: Denies CP, palpitations, SOB, orthopnea Pulm: Denies SOB, cough, wheezing GI: Denies abdominal pain, nausea, vomiting, diarrhea, constipation GU: Denies dysuria, hematuria, frequency, vaginal discharge Msk: Denies muscle cramps, joint pains Neuro: Denies weakness, numbness, tingling Skin: Denies rashes, bruising Psych: Denies hallucinations  + anxiety, depression      Objective:    Blood pressure 139/90,  pulse 94, temperature 98 F (36.7 C), temperature source Oral, weight 245 lb 9.6 oz (111.4 kg), SpO2 94 %.  FSBS 238 in clinic.   Gen. Pleasant, well-nourished, in no distress, normal affect  HEENT: [place]/AT, face symmetric, conjunctiva clear, no scleral icterus, PERRLA, EOMI, nares patent without drainage Lungs: no accessory muscle use, CTAB, no wheezes or rales Cardiovascular: RRR, no m/r/g, no peripheral edema Musculoskeletal: No deformities, no cyanosis or clubbing, normal tone Neuro:  A&Ox3, CN II-XII intact, normal gait Skin:  Warm, no lesions/ rash   Wt Readings from Last 3 Encounters:  07/28/20 245 lb 9.6 oz (111.4 kg)  12/17/19 242 lb (109.8 kg)  07/15/18 202 lb (91.6 kg)    Lab Results  Component Value Date   WBC 9.0 12/17/2019   HGB 14.9 12/17/2019   HCT 43.0 12/17/2019   PLT 282.0 12/17/2019   GLUCOSE 115 (H) 12/17/2019   CHOL 172 12/17/2019   TRIG 172.0 (H) 12/17/2019   HDL 33.10 (L) 12/17/2019   LDLDIRECT 131.0 12/31/2017   LDLCALC 104 (H) 12/17/2019   ALT 29 10/07/2017   AST 15 10/07/2017   NA 137 12/17/2019   K 4.2 12/17/2019   CL 102 12/17/2019   CREATININE 0.90 12/17/2019   BUN 15 12/17/2019   CO2 29 12/17/2019   TSH 2.53 12/31/2017   INR 0.97 09/13/2017   HGBA1C 9.2 (H) 12/17/2019   MICROALBUR 1.0 12/17/2019    Assessment/Plan:  Diabetes mellitus without complication (HCC)  -Blood sugar 238 in office -Hemoglobin A1c 8.7% this visit -Patient given  2 units of Humalog while in clinic.  Observed in blood sugar recheck. -Discussed the importance of lifestyle modifications -Continue Metformin 1000 mg twice daily -We will start glipizide 5 mg twice daily -We will also start ACEI -Patient encouraged to check FSBS more frequently and keep a log to bring with him to clinic -We will obtain labs -Follow-up in 2-4 weeks, soonerifneeded - Plan: POCT Glucose (Device for Home Use), POCT glycosylated hemoglobin (Hb A1C), CMP with eGFR([facility]), Lipase, CBC with  Differential/Platelet, Hemoglobin A1c, glipiZIDE (GLUCOTROL) 5 MG tablet  Essential hypertension  -Elevated -Discussed starting low-dose lisinopril 5 mg -Discussed the importance of lifestyle modifications. -Patient given handout -Patient to check BP at home and keep a log to bring with him to next clinic appointment. - Plan: lisinopril (ZESTRIL) 5 MG tablet  Anxiety -GAD-7 score 16 -Continue counseling -Discussed adjusting dose of Zoloft from 100 mg to 150 mg daily. -Patient has 100 mg tabs at home.  We will send in new Rx for 50 mg tab for combined daily dose of 150 mg -Follow-up in 4-6 weeks, sooner if needed  - Plan: sertraline (ZOLOFT) 50 MG tablet  Depression, major, single episode, moderate (HCC) -PHQ-9 score 18 -Continue counseling -Given increased symptoms discussed increasing dose of Zoloft from 100 mg to 150 mg daily -Patient has 100 mg tabs at home.  We will send in new Rx for 50 mg tabs for combined daily dose of 150 mg. -Follow-up in 4-6 weeks, sooner if needed -Given precautions - Plan: sertraline (ZOLOFT) 50 MG tablet  F/u in 4 weeks, sooner if needed  Given a note for work  Grier Mitts, MD

## 2020-07-28 NOTE — Patient Instructions (Addendum)
Diabetes Mellitus and Nutrition, Adult When you have diabetes (diabetes mellitus), it is very important to have healthy eating habits because your blood sugar (glucose) levels are greatly affected by what you eat and drink. Eating healthy foods in the appropriate amounts, at about the same times every day, can help you:  Control your blood glucose.  Lower your risk of heart disease.  Improve your blood pressure.  Reach or maintain a healthy weight. Every person with diabetes is different, and each person has different needs for a meal plan. Your health care provider may recommend that you work with a diet and nutrition specialist (dietitian) to make a meal plan that is best for you. Your meal plan may vary depending on factors such as:  The calories you need.  The medicines you take.  Your weight.  Your blood glucose, blood pressure, and cholesterol levels.  Your activity level.  Other health conditions you have, such as heart or kidney disease. How do carbohydrates affect me? Carbohydrates, also called carbs, affect your blood glucose level more than any other type of food. Eating carbs naturally raises the amount of glucose in your blood. Carb counting is a method for keeping track of how many carbs you eat. Counting carbs is important to keep your blood glucose at a healthy level, especially if you use insulin or take certain oral diabetes medicines. It is important to know how many carbs you can safely have in each meal. This is different for every person. Your dietitian can help you calculate how many carbs you should have at each meal and for each snack. Foods that contain carbs include:  Bread, cereal, rice, pasta, and crackers.  Potatoes and corn.  Peas, beans, and lentils.  Milk and yogurt.  Fruit and juice.  Desserts, such as cakes, cookies, ice cream, and candy. How does alcohol affect me? Alcohol can cause a sudden decrease in blood glucose (hypoglycemia),  especially if you use insulin or take certain oral diabetes medicines. Hypoglycemia can be a life-threatening condition. Symptoms of hypoglycemia (sleepiness, dizziness, and confusion) are similar to symptoms of having too much alcohol. If your health care provider says that alcohol is safe for you, follow these guidelines:  Limit alcohol intake to no more than 1 drink per day for nonpregnant women and 2 drinks per day for men. One drink equals 12 oz of beer, 5 oz of wine, or 1 oz of hard liquor.  Do not drink on an empty stomach.  Keep yourself hydrated with water, diet soda, or unsweetened iced tea.  Keep in mind that regular soda, juice, and other mixers may contain a lot of sugar and must be counted as carbs. What are tips for following this plan?  Reading food labels  Start by checking the serving size on the "Nutrition Facts" label of packaged foods and drinks. The amount of calories, carbs, fats, and other nutrients listed on the label is based on one serving of the item. Many items contain more than one serving per package.  Check the total grams (g) of carbs in one serving. You can calculate the number of servings of carbs in one serving by dividing the total carbs by 15. For example, if a food has 30 g of total carbs, it would be equal to 2 servings of carbs.  Check the number of grams (g) of saturated and trans fats in one serving. Choose foods that have low or no amount of these fats.  Check the number of   milligrams (mg) of salt (sodium) in one serving. Most people should limit total sodium intake to less than 2,300 mg per day.  Always check the nutrition information of foods labeled as "low-fat" or "nonfat". These foods may be higher in added sugar or refined carbs and should be avoided.  Talk to your dietitian to identify your daily goals for nutrients listed on the label. Shopping  Avoid buying canned, premade, or processed foods. These foods tend to be high in fat, sodium,  and added sugar.  Shop around the outside edge of the grocery store. This includes fresh fruits and vegetables, bulk grains, fresh meats, and fresh dairy. Cooking  Use low-heat cooking methods, such as baking, instead of high-heat cooking methods like deep frying.  Cook using healthy oils, such as olive, canola, or sunflower oil.  Avoid cooking with butter, cream, or high-fat meats. Meal planning  Eat meals and snacks regularly, preferably at the same times every day. Avoid going long periods of time without eating.  Eat foods high in fiber, such as fresh fruits, vegetables, beans, and whole grains. Talk to your dietitian about how many servings of carbs you can eat at each meal.  Eat 4-6 ounces (oz) of lean protein each day, such as lean meat, chicken, fish, eggs, or tofu. One oz of lean protein is equal to: ? 1 oz of meat, chicken, or fish. ? 1 egg. ?  cup of tofu.  Eat some foods each day that contain healthy fats, such as avocado, nuts, seeds, and fish. Lifestyle  Check your blood glucose regularly.  Exercise regularly as told by your health care provider. This may include: ? 150 minutes of moderate-intensity or vigorous-intensity exercise each week. This could be brisk walking, biking, or water aerobics. ? Stretching and doing strength exercises, such as yoga or weightlifting, at least 2 times a week.  Take medicines as told by your health care provider.  Do not use any products that contain nicotine or tobacco, such as cigarettes and e-cigarettes. If you need help quitting, ask your health care provider.  Work with a Social worker or diabetes educator to identify strategies to manage stress and any emotional and social challenges. Questions to ask a health care provider  Do I need to meet with a diabetes educator?  Do I need to meet with a dietitian?  What number can I call if I have questions?  When are the best times to check my blood glucose? Where to find more  information:  American Diabetes Association: diabetes.org  Academy of Nutrition and Dietetics: www.eatright.[employer] of Diabetes and Digestive and Kidney Diseases (NIH): [employer] Summary  A healthy meal plan will help you control your blood glucose and maintain a healthy lifestyle.  Working with a diet and nutrition specialist (dietitian) can help you make a meal plan that is best for you.  Keep in mind that carbohydrates (carbs) and alcohol have immediate effects on your blood glucose levels. It is important to count carbs and to use alcohol carefully. This information is not intended to replace advice given to you by your health care provider. Make sure you discuss any questions you have with your health care provider. Document Revised: 08/23/2017 Document Reviewed: 10/15/2016 Elsevier Patient Education  2020 [place].  Diabetes Basics  Diabetes (diabetes mellitus) is a long-term (chronic) disease. It occurs when the body does not properly use sugar (glucose) that is released from food after you eat. Diabetes may be caused by one or both  of these problems:  Your pancreas does not make enough of a hormone called insulin.  Your body does not react in a normal way to insulin that it makes. Insulin lets sugars (glucose) go into cells in your body. This gives you energy. If you have diabetes, sugars cannot get into cells. This causes high blood sugar (hyperglycemia). Follow these instructions at home: How is diabetes treated? You may need to take insulin or other diabetes medicines daily to keep your blood sugar in balance. Take your diabetes medicines every day as told by your doctor. List your diabetes medicines here: Diabetes medicines  Name of medicine: ______________________________ ? Amount (dose): _______________ Time (a.m./p.m.): _______________ Notes: ___________________________________  Name of medicine: ______________________________ ? Amount  (dose): _______________ Time (a.m./p.m.): _______________ Notes: ___________________________________  Name of medicine: ______________________________ ? Amount (dose): _______________ Time (a.m./p.m.): _______________ Notes: ___________________________________ If you use insulin, you will learn how to give yourself insulin by injection. You may need to adjust the amount based on the food that you eat. List the types of insulin you use here: Insulin  Insulin type: ______________________________ ? Amount (dose): _______________ Time (a.m./p.m.): _______________ Notes: ___________________________________  Insulin type: ______________________________ ? Amount (dose): _______________ Time (a.m./p.m.): _______________ Notes: ___________________________________  Insulin type: ______________________________ ? Amount (dose): _______________ Time (a.m./p.m.): _______________ Notes: ___________________________________  Insulin type: ______________________________ ? Amount (dose): _______________ Time (a.m./p.m.): _______________ Notes: ___________________________________  Insulin type: ______________________________ ? Amount (dose): _______________ Time (a.m./p.m.): _______________ Notes: ___________________________________ How do I manage my blood sugar?  Check your blood sugar levels using a blood glucose monitor as directed by your doctor. Your doctor will set treatment goals for you. Generally, you should have these blood sugar levels:  Before meals (preprandial): 80-130 mg/dL (4.4-7.2 mmol/L).  After meals (postprandial): below 180 mg/dL (10 mmol/L).  A1c level: less than 7%. Write down the times that you will check your blood sugar levels: Blood sugar checks  Time: _______________ Notes: ___________________________________  Time: _______________ Notes: ___________________________________  Time: _______________ Notes: ___________________________________  Time: _______________ Notes:  ___________________________________  Time: _______________ Notes: ___________________________________  Time: _______________ Notes: ___________________________________  What do I need to know about low blood sugar? Low blood sugar is called hypoglycemia. This is when blood sugar is at or below 70 mg/dL (3.9 mmol/L). Symptoms may include:  Feeling: ? Hungry. ? Worried or nervous (anxious). ? Sweaty and clammy. ? Confused. ? Dizzy. ? Sleepy. ? Sick to your stomach (nauseous).  Having: ? A fast heartbeat. ? A headache. ? A change in your vision. ? Tingling or no feeling (numbness) around the mouth, lips, or tongue. ? Jerky movements that you cannot control (seizure).  Having trouble with: ? Moving (coordination). ? Sleeping. ? Passing out (fainting). ? Getting upset easily (irritability). Treating low blood sugar To treat low blood sugar, eat or drink something sugary right away. If you can think clearly and swallow safely, follow the 15:15 rule:  Take 15 grams of a fast-acting carb (carbohydrate). Talk with your doctor about how much you should take.  Some fast-acting carbs are: ? Sugar tablets (glucose pills). Take 3-4 glucose pills. ? 6-8 pieces of hard candy. ? 4-6 oz (120-150 mL) of fruit juice. ? 4-6 oz (120-150 mL) of regular (not diet) soda. ? 1 Tbsp (15 mL) honey or sugar.  Check your blood sugar 15 minutes after you take the carb.  If your blood sugar is still at or below 70 mg/dL (3.9 mmol/L), take 15 grams of a carb again.  If your blood sugar does  not go above 70 mg/dL (3.9 mmol/L) after 3 tries, get help right away.  After your blood sugar goes back to normal, eat a meal or a snack within 1 hour. Treating very low blood sugar If your blood sugar is at or below 54 mg/dL (3 mmol/L), you have very low blood sugar (severe hypoglycemia). This is an emergency. Do not wait to see if the symptoms will go away. Get medical help right away. Call your local emergency  services (911 in the [place] Do not drive yourself to the hospital. Questions to ask your health care provider  Do I need to meet with a diabetes educator?  What equipment will I need to care for myself at home?  What diabetes medicines do I need? When should I take them?  How often do I need to check my blood sugar?  What number can I call if I have questions?  When is my next doctor's visit?  Where can I find a support group for people with diabetes? Where to find more information  American Diabetes Association: www.diabetes.org  American Association of Diabetes Educators: www.diabeteseducator.org/patient-resources Contact a doctor if:  Your blood sugar is at or above 240 mg/dL (13.3 mmol/L) for 2 days in a row.  You have been sick or have had a fever for 2 days or more, and you are not getting better.  You have any of these problems for more than 6 hours: ? You cannot eat or drink. ? You feel sick to your stomach (nauseous). ? You throw up (vomit). ? You have watery poop (diarrhea). Get help right away if:  Your blood sugar is lower than 54 mg/dL (3 mmol/L).  You get confused.  You have trouble: ? Thinking clearly. ? Breathing. Summary  Diabetes (diabetes mellitus) is a long-term (chronic) disease. It occurs when the body does not properly use sugar (glucose) that is released from food after digestion.  Take insulin and diabetes medicines as told.  Check your blood sugar every day, as often as told.  Keep all follow-up visits as told by your doctor. This is important. This information is not intended to replace advice given to you by your health care provider. Make sure you discuss any questions you have with your health care provider. Document Revised: 06/03/2019 Document Reviewed: 12/13/2017 Elsevier Patient Education  2020 [place] Your Hypertension Hypertension is commonly called high blood pressure. This is when the force of your blood  pressing against the walls of your arteries is too strong. Arteries are blood vessels that carry blood from your heart throughout your body. Hypertension forces the heart to work harder to pump blood, and may cause the arteries to become narrow or stiff. Having untreated or uncontrolled hypertension can cause heart attack, stroke, kidney disease, and other problems. What are blood pressure readings? A blood pressure reading consists of a higher number over a lower number. Ideally, your blood pressure should be below 120/80. The first ("top") number is called the systolic pressure. It is a measure of the pressure in your arteries as your heart beats. The second ("bottom") number is called the diastolic pressure. It is a measure of the pressure in your arteries as the heart relaxes. What does my blood pressure reading mean? Blood pressure is classified into four stages. Based on your blood pressure reading, your health care provider may use the following stages to determine what type of treatment you need, if any. Systolic pressure and diastolic pressure are measured in  a unit called mm Hg. Normal  Systolic pressure: below 657.  Diastolic pressure: below 80. Elevated  Systolic pressure: 846-962.  Diastolic pressure: below 80. Hypertension stage 1  Systolic pressure: 952-841.  Diastolic pressure: 32-44. Hypertension stage 2  Systolic pressure: 010 or above.  Diastolic pressure: 90 or above. What health risks are associated with hypertension? Managing your hypertension is an important responsibility. Uncontrolled hypertension can lead to:  A heart attack.  A stroke.  A weakened blood vessel (aneurysm).  Heart failure.  Kidney damage.  Eye damage.  Metabolic syndrome.  Memory and concentration problems. What changes can I make to manage my hypertension? Hypertension can be managed by making lifestyle changes and possibly by taking medicines. Your health care provider will help  you make a plan to bring your blood pressure within a normal range. Eating and drinking   Eat a diet that is high in fiber and potassium, and low in salt (sodium), added sugar, and fat. An example eating plan is called the DASH (Dietary Approaches to Stop Hypertension) diet. To eat this way: ? Eat plenty of fresh fruits and vegetables. Try to fill half of your plate at each meal with fruits and vegetables. ? Eat whole grains, such as whole wheat pasta, brown rice, or whole grain bread. Fill about one quarter of your plate with whole grains. ? Eat low-fat diary products. ? Avoid fatty cuts of meat, processed or cured meats, and poultry with skin. Fill about one quarter of your plate with lean proteins such as fish, chicken without skin, beans, eggs, and tofu. ? Avoid premade and processed foods. These tend to be higher in sodium, added sugar, and fat.  Reduce your daily sodium intake. Most people with hypertension should eat less than 1,500 mg of sodium a day.  Limit alcohol intake to no more than 1 drink a day for nonpregnant women and 2 drinks a day for men. One drink equals 12 oz of beer, 5 oz of wine, or 1 oz of hard liquor. Lifestyle  Work with your health care provider to maintain a healthy body weight, or to lose weight. Ask what an ideal weight is for you.  Get at least 30 minutes of exercise that causes your heart to beat faster (aerobic exercise) most days of the week. Activities may include walking, swimming, or biking.  Include exercise to strengthen your muscles (resistance exercise), such as weight lifting, as part of your weekly exercise routine. Try to do these types of exercises for 30 minutes at least 3 days a week.  Do not use any products that contain nicotine or tobacco, such as cigarettes and e-cigarettes. If you need help quitting, ask your health care provider.  Control any long-term (chronic) conditions you have, such as high cholesterol or  diabetes. Monitoring  Monitor your blood pressure at home as told by your health care provider. Your personal target blood pressure may vary depending on your medical conditions, your age, and other factors.  Have your blood pressure checked regularly, as often as told by your health care provider. Working with your health care provider  Review all the medicines you take with your health care provider because there may be side effects or interactions.  Talk with your health care provider about your diet, exercise habits, and other lifestyle factors that may be contributing to hypertension.  Visit your health care provider regularly. Your health care provider can help you create and adjust your plan for managing hypertension. Will I need  medicine to control my blood pressure? Your health care provider may prescribe medicine if lifestyle changes are not enough to get your blood pressure under control, and if:  Your systolic blood pressure is 130 or higher.  Your diastolic blood pressure is 80 or higher. Take medicines only as told by your health care provider. Follow the directions carefully. Blood pressure medicines must be taken as prescribed. The medicine does not work as well when you skip doses. Skipping doses also puts you at risk for problems. Contact a health care provider if:  You think you are having a reaction to medicines you have taken.  You have repeated (recurrent) headaches.  You feel dizzy.  You have swelling in your ankles.  You have trouble with your vision. Get help right away if:  You develop a severe headache or confusion.  You have unusual weakness or numbness, or you feel faint.  You have severe pain in your chest or abdomen.  You vomit repeatedly.  You have trouble breathing. Summary  Hypertension is when the force of blood pumping through your arteries is too strong. If this condition is not controlled, it may put you at risk for serious  complications.  Your personal target blood pressure may vary depending on your medical conditions, your age, and other factors. For most people, a normal blood pressure is less than 120/80.  Hypertension is managed by lifestyle changes, medicines, or both. Lifestyle changes include weight loss, eating a healthy, low-sodium diet, exercising more, and limiting alcohol. This information is not intended to replace advice given to you by your health care provider. Make sure you discuss any questions you have with your health care provider. Document Revised: 01/02/2019 Document Reviewed: 08/08/2016 Elsevier Patient Education  [place].

## 2020-07-29 LAB — CBC WITH DIFFERENTIAL/PLATELET
Absolute Monocytes: 742 cells/uL (ref 200–950)
Basophils Absolute: 95 cells/uL (ref 0–200)
Basophils Relative: 0.9 %
Eosinophils Absolute: 265 cells/uL (ref 15–500)
Eosinophils Relative: 2.5 %
HCT: 49.4 % (ref 38.5–50.0)
Hemoglobin: 16.4 g/dL (ref 13.2–17.1)
Lymphs Abs: 2608 cells/uL (ref 850–3900)
MCH: 28.5 pg (ref 27.0–33.0)
MCHC: 33.2 g/dL (ref 32.0–36.0)
MCV: 85.9 fL (ref 80.0–100.0)
MPV: 11.6 fL (ref 7.5–12.5)
Monocytes Relative: 7 %
Neutro Abs: 6890 cells/uL (ref 1500–7800)
Neutrophils Relative %: 65 %
Platelets: 255 10*3/uL (ref 140–400)
RBC: 5.75 10*6/uL (ref 4.20–5.80)
RDW: 13.1 % (ref 11.0–15.0)
Total Lymphocyte: 24.6 %
WBC: 10.6 10*3/uL (ref 3.8–10.8)

## 2020-07-29 LAB — COMPLETE METABOLIC PANEL WITH GFR
AG Ratio: 2 (calc) (ref 1.0–2.5)
ALT: 26 U/L (ref 9–46)
AST: 16 U/L (ref 10–40)
Albumin: 4.4 g/dL (ref 3.6–5.1)
Alkaline phosphatase (APISO): 81 U/L (ref 36–130)
BUN: 14 mg/dL (ref 7–25)
CO2: 28 mmol/L (ref 20–32)
Calcium: 9.5 mg/dL (ref 8.6–10.3)
Chloride: 101 mmol/L (ref 98–110)
Creat: 0.76 mg/dL (ref 0.60–1.35)
GFR, Est African American: 127 mL/min/{1.73_m2} (ref >=60)
GFR, Est Non African American: 109 mL/min/{1.73_m2} (ref >=60)
Globulin: 2.2 g/dL (calc) (ref 1.9–3.7)
Glucose, Bld: 218 mg/dL — ABNORMAL HIGH (ref 65–99)
Potassium: 4.8 mmol/L (ref 3.5–5.3)
Sodium: 137 mmol/L (ref 135–146)
Total Bilirubin: 0.4 mg/dL (ref 0.2–1.2)
Total Protein: 6.6 g/dL (ref 6.1–8.1)

## 2020-07-29 LAB — HEMOGLOBIN A1C
Hgb A1c MFr Bld: 9.3 % of total Hgb — ABNORMAL HIGH (ref <5.7)
Mean Plasma Glucose: 220 (calc)
eAG (mmol/L): 12.2 (calc)

## 2020-07-29 LAB — LIPASE: Lipase: 135 U/L — ABNORMAL HIGH (ref 7–60)

## 2020-08-01 ENCOUNTER — Encounter: Payer: Self-pay | Admitting: Family Medicine

## 2020-08-01 ENCOUNTER — Emergency Department (HOSPITAL_COMMUNITY)
Admission: EM | Admit: 2020-08-01 | Discharge: 2020-08-01 | Disposition: A | Discharge status: Home / Self Care | Payer: 59 | Attending: Emergency Medicine | Admitting: Emergency Medicine

## 2020-08-01 ENCOUNTER — Encounter (HOSPITAL_COMMUNITY): Payer: Self-pay | Admitting: Emergency Medicine

## 2020-08-01 DIAGNOSIS — R112 Nausea with vomiting, unspecified: Secondary | ICD-10-CM | POA: Diagnosis not present

## 2020-08-01 DIAGNOSIS — R197 Diarrhea, unspecified: Secondary | ICD-10-CM | POA: Diagnosis not present

## 2020-08-01 DIAGNOSIS — Z5321 Procedure and treatment not carried out due to patient leaving prior to being seen by health care provider: Secondary | ICD-10-CM | POA: Insufficient documentation

## 2020-08-01 DIAGNOSIS — R109 Unspecified abdominal pain: Secondary | ICD-10-CM | POA: Insufficient documentation

## 2020-08-01 LAB — CBC WITH DIFFERENTIAL/PLATELET
Abs Immature Granulocytes: 0.07 10*3/uL (ref 0.00–0.07)
Basophils Absolute: 0.1 10*3/uL (ref 0.0–0.1)
Basophils Relative: 1 %
Eosinophils Absolute: 0.3 10*3/uL (ref 0.0–0.5)
Eosinophils Relative: 3 %
HCT: 48.5 % (ref 39.0–52.0)
Hemoglobin: 16 g/dL (ref 13.0–17.0)
Immature Granulocytes: 1 %
Lymphocytes Relative: 26 %
Lymphs Abs: 2.4 10*3/uL (ref 0.7–4.0)
MCH: 28.2 pg (ref 26.0–34.0)
MCHC: 33 g/dL (ref 30.0–36.0)
MCV: 85.5 fL (ref 80.0–100.0)
Monocytes Absolute: 0.6 10*3/uL (ref 0.1–1.0)
Monocytes Relative: 7 %
Neutro Abs: 5.8 10*3/uL (ref 1.7–7.7)
Neutrophils Relative %: 62 %
Platelets: 260 10*3/uL (ref 150–400)
RBC: 5.67 MIL/uL (ref 4.22–5.81)
RDW: 12.6 % (ref 11.5–15.5)
WBC: 9.2 10*3/uL (ref 4.0–10.5)
nRBC: 0 % (ref 0.0–0.2)

## 2020-08-01 LAB — COMPREHENSIVE METABOLIC PANEL
ALT: 38 U/L (ref 0–44)
AST: 25 U/L (ref 15–41)
Albumin: 4 g/dL (ref 3.5–5.0)
Alkaline Phosphatase: 65 U/L (ref 38–126)
Anion gap: 10 (ref 5–15)
BUN: 10 mg/dL (ref 6–20)
CO2: 28 mmol/L (ref 22–32)
Calcium: 9.7 mg/dL (ref 8.9–10.3)
Chloride: 99 mmol/L (ref 98–111)
Creatinine, Ser: 0.83 mg/dL (ref 0.61–1.24)
GFR, Estimated: >60 mL/min (ref >60)
Glucose, Bld: 158 mg/dL — ABNORMAL HIGH (ref 70–99)
Potassium: 4.3 mmol/L (ref 3.5–5.1)
Sodium: 137 mmol/L (ref 135–145)
Total Bilirubin: 0.5 mg/dL (ref 0.3–1.2)
Total Protein: 6.7 g/dL (ref 6.5–8.1)

## 2020-08-01 LAB — LIPASE, BLOOD: Lipase: 62 U/L — ABNORMAL HIGH (ref 11–51)

## 2020-08-01 NOTE — ED Triage Notes (Signed)
Pt here from home with c/o abd pain along with n/v /d ,over the weekend , pt cbg at 134 , wants to double check to make sure he does not have pancreatitis

## 2020-08-19 ENCOUNTER — Other Ambulatory Visit: Payer: Self-pay | Admitting: Family Medicine

## 2020-08-19 DIAGNOSIS — F419 Anxiety disorder, unspecified: Secondary | ICD-10-CM

## 2020-08-19 DIAGNOSIS — F321 Major depressive disorder, single episode, moderate: Secondary | ICD-10-CM

## 2020-08-29 ENCOUNTER — Encounter: Payer: Self-pay | Admitting: Family Medicine

## 2020-08-29 ENCOUNTER — Other Ambulatory Visit: Payer: Self-pay

## 2020-08-29 ENCOUNTER — Ambulatory Visit: Payer: 59 | Admitting: Family Medicine

## 2020-08-29 VITALS — BP 120/84 | HR 81 | Temp 97.9°F | Wt 248.6 lb

## 2020-08-29 DIAGNOSIS — E1169 Type 2 diabetes mellitus with other specified complication: Secondary | ICD-10-CM | POA: Diagnosis not present

## 2020-08-29 DIAGNOSIS — F419 Anxiety disorder, unspecified: Secondary | ICD-10-CM

## 2020-08-29 DIAGNOSIS — G4709 Other insomnia: Secondary | ICD-10-CM

## 2020-08-29 DIAGNOSIS — I10 Essential (primary) hypertension: Secondary | ICD-10-CM

## 2020-08-29 DIAGNOSIS — F321 Major depressive disorder, single episode, moderate: Secondary | ICD-10-CM | POA: Diagnosis not present

## 2020-08-29 MED ORDER — TRAZODONE HCL 50 MG PO TABS: 50.0000 mg | ORAL_TABLET | Freq: Every evening | ORAL | 1 refills | Status: DC | PRN

## 2020-08-29 NOTE — Patient Instructions (Addendum)
Start taking Zoloft 100 mg daily x 2 wks.  We will plan to decrease the dose every 2 weeks as tolerated then start a new medication such as Paxil.  Please look into scheduling an appointment for counseling.  We will plan to follow-up in the next few weeks.

## 2020-08-29 NOTE — Progress Notes (Signed)
Subjective:    Patient ID: Derrick West, male    DOB: [birth year], 44 y.o.   MRN: [id number]  No chief complaint on file.   HPI Patient was seen today for f/u on DM II. hemoglobin A1c 9.3% on 07/28/2020.  Taking glipizide 5 mg twice daily and Metformin 1000 mg twice daily.  Patient notes improvement in blood sugar.  States it was 128 this morning.  Trying to eat less carbs and no carbs for breakfast.  Patient notes difficulty sleeping and increased anxiety.  In the past pt was seen by Psychiatry and given Trazodone and Klonopin which helped some.  On Zoloft 150 mg.  Taking lisinopril 5 mg.  Pt endorses no motivation, does not want to get out of bed.  Snores at night if lays on back.  Thought increased tiredness was 2/2 work schedule, 12 hr shifts, 2 wks on days, then 2 wks on nights.  Past Medical History:  Diagnosis Date  . Anxiety and depression   . Cholecystitis   . DIABETES MELLITUS, TYPE II 12/24/2007  . Fatty liver   . GANGLION CYST, WRIST, LEFT 07/29/2007  . Headache(784.0)   . OBESITY 12/24/2007    Allergies  Allergen Reactions  . Amoxicillin Other (See Comments)    From childhood; reaction not recalled    ROS General: Denies fever, chills, night sweats, changes in weight, changes in appetite HEENT: Denies headaches, ear pain, changes in vision, rhinorrhea, sore throat CV: Denies CP, palpitations, SOB, orthopnea Pulm: Denies SOB, cough, wheezing GI: Denies abdominal pain, nausea, vomiting, diarrhea, constipation GU: Denies dysuria, hematuria, frequency Msk: Denies muscle cramps, joint pains Neuro: Denies weakness, numbness, tingling Skin: Denies rashes, bruising Psych: Denies hallucinations  +insomnia, anxiety, and depression      Objective:    Blood pressure 120/84, pulse 81, temperature 97.9 F (36.6 C), temperature source Oral, weight 248 lb 9.6 oz (112.8 kg), SpO2 99 %.  Gen. Pleasant, well-nourished, in no distress, normal affect   HEENT: [place]/AT, face symmetric,  conjunctiva clear, no scleral icterus, PERRLA, EOMI, nares patent without drainage Lungs: no accessory muscle use, CTAB, no wheezes or rales Cardiovascular: RRR, no m/r/g, no peripheral edema Abdomen: BS present, soft, NT/ND, no hepatosplenomegaly. Musculoskeletal: No deformities, no cyanosis or clubbing, normal tone Neuro:  A&Ox3, CN II-XII intact, normal gait Skin:  Warm, no lesions/ rash   Wt Readings from Last 3 Encounters:  07/28/20 245 lb 9.6 oz (111.4 kg)  12/17/19 242 lb (109.8 kg)  07/15/18 202 lb (91.6 kg)    Lab Results  Component Value Date   WBC 9.2 08/01/2020   HGB 16.0 08/01/2020   HCT 48.5 08/01/2020   PLT 260 08/01/2020   GLUCOSE 158 (H) 08/01/2020   CHOL 172 12/17/2019   TRIG 172.0 (H) 12/17/2019   HDL 33.10 (L) 12/17/2019   LDLDIRECT 131.0 12/31/2017   LDLCALC 104 (H) 12/17/2019   ALT 38 08/01/2020   AST 25 08/01/2020   NA 137 08/01/2020   K 4.3 08/01/2020   CL 99 08/01/2020   CREATININE 0.83 08/01/2020   BUN 10 08/01/2020   CO2 28 08/01/2020   TSH 2.53 12/31/2017   INR 0.97 09/13/2017   HGBA1C 9.3 (H) 07/28/2020   MICROALBUR 1.0 12/17/2019    Assessment/Plan:  Type 2 diabetes mellitus with other specified complication, without long-term current use of insulin ([place]) -improving. fsbs 120s this am. -hgb A1C 9.3% on 07/28/20. -continue Metformin 1000 mg BID, glipizide 5 mg BID, and lifestyle modifications -continue ACEI and  statin  Essential hypertension -controlled -continue lisinopril 5 mg daily -continue lifestyle modifications  Anxiety -GAD-7 score 15 -discussed need to restartcounseling/BH f/u -given lack of response to Zoloft 150 mg, will start wean with plans to start a new agent such as Paxil or Wellbutrin. -given precautions  Depression, major, single episode, moderate (HCC) -PHQ-9 score 19 -Discussed restarting counseling and follow-up with BH/psychiatry -given lack of response to Zoloft 150 mg, will start wean with plans to  start a different agent such as Paxil or Wellbutrin.  Will start Zoloft 100 mg daily x 2 wks and decrease dose q few wks as tolerated. -given precautions  Other insomnia  -discussed sleep hygiene -discussed f/u with [place] -will restart trazodone - Plan: traZODone (DESYREL) 50 MG tablet  F/u in 2-4 wks, sooner if needed  Grier Mitts, MD

## 2020-08-30 ENCOUNTER — Other Ambulatory Visit: Payer: Self-pay | Admitting: Family Medicine

## 2020-09-10 ENCOUNTER — Encounter: Payer: Self-pay | Admitting: Family Medicine

## 2020-09-12 ENCOUNTER — Other Ambulatory Visit: Payer: Self-pay | Admitting: Family Medicine

## 2020-09-12 DIAGNOSIS — G4709 Other insomnia: Secondary | ICD-10-CM

## 2020-09-29 ENCOUNTER — Ambulatory Visit: Payer: 59 | Admitting: Family Medicine

## 2020-10-03 ENCOUNTER — Ambulatory Visit: Payer: 59 | Admitting: Family Medicine

## 2020-10-03 ENCOUNTER — Other Ambulatory Visit: Payer: Self-pay

## 2020-10-03 ENCOUNTER — Encounter: Payer: Self-pay | Admitting: Family Medicine

## 2020-10-03 VITALS — BP 118/78 | HR 80 | Temp 97.8°F | Wt 250.8 lb

## 2020-10-03 DIAGNOSIS — E1169 Type 2 diabetes mellitus with other specified complication: Secondary | ICD-10-CM | POA: Diagnosis not present

## 2020-10-03 DIAGNOSIS — F321 Major depressive disorder, single episode, moderate: Secondary | ICD-10-CM | POA: Diagnosis not present

## 2020-10-03 DIAGNOSIS — F419 Anxiety disorder, unspecified: Secondary | ICD-10-CM | POA: Diagnosis not present

## 2020-10-03 DIAGNOSIS — I10 Essential (primary) hypertension: Secondary | ICD-10-CM

## 2020-10-03 DIAGNOSIS — G4709 Other insomnia: Secondary | ICD-10-CM

## 2020-10-03 MED ORDER — BUPROPION HCL 75 MG PO TABS: 75.0000 mg | ORAL_TABLET | Freq: Two times a day (BID) | ORAL | 2 refills | Status: DC

## 2020-10-03 MED ORDER — TRAZODONE HCL 50 MG PO TABS: 50.0000 mg | ORAL_TABLET | Freq: Every evening | ORAL | 2 refills | Status: DC | PRN

## 2020-10-03 MED ORDER — ONETOUCH VERIO VI STRP: ORAL_STRIP | 1 refills | Status: DC

## 2020-10-03 MED ORDER — CONTOUR TEST VI STRP: ORAL_STRIP | 12 refills | Status: DC

## 2020-10-03 NOTE — Progress Notes (Signed)
Subjective:    Patient ID: Derrick West, male    DOB: July 20, 1976, 45 y.o.   MRN: [id number]  No chief complaint on file.   HPI Patient was seen today for f/u.  At last OFV, disvussed weaning off zoloft as 150 mg was ineffective.  Pt notes being more short tempered.  States his daughter and co-workers have also noticed.  Pt states bs was 65 at noon after waking up.  Pt worked last night, ate breakfast at [employer] around 7:30am then went to sleep.  Pt trying to work on diet, but has to eat things that are quick/convienient.  Taking glipizide 5 mg BID and metformin 100 mg BID.  Requesting refills on test strips.  Has a onetouch meter, but has been using his late wife's contour meter as he likes it better.  States bp has been good on lisinopril 5 mg.  Endorses wt gain.  Started going back to the gym.  Past Medical History:  Diagnosis Date  . Anxiety and depression   . Cholecystitis   . DIABETES MELLITUS, TYPE II 12/24/2007  . Fatty liver   . GANGLION CYST, WRIST, LEFT 07/29/2007  . Headache(784.0)   . OBESITY 12/24/2007    Allergies  Allergen Reactions  . Amoxicillin Other (See Comments)    From childhood; reaction not recalled    ROS General: Denies fever, chills, night sweats, changes in appetite +insomnia, wt gain HEENT: Denies headaches, ear pain, changes in vision, rhinorrhea, sore throat CV: Denies CP, palpitations, SOB, orthopnea Pulm: Denies SOB, cough, wheezing GI: Denies abdominal pain, nausea, vomiting, diarrhea, constipation GU: Denies dysuria, hematuria, frequency Msk: Denies muscle cramps, joint pains Neuro: Denies weakness, numbness, tingling Skin: Denies rashes, bruising Psych: Denies hallucinations  +anxiety, depression     Objective:    Blood pressure 118/78, pulse 80, temperature 97.8 F (36.6 C), temperature source Oral, weight 250 lb 12.8 oz (113.8 kg), SpO2 96 %.  Gen. Pleasant, well-nourished, in no distress, normal affect   HEENT: [place]/AT, face symmetric,  conjunctiva clear, no scleral icterus, PERRLA, EOMI, nares patent without drainage Lungs: no accessory muscle use Cardiovascular: RRR, no peripheral edema Musculoskeletal: No deformities, no cyanosis or clubbing, normal tone Neuro:  A&Ox3, CN II-XII intact, normal gait Skin:  Warm, no lesions/ rash   Wt Readings from Last 3 Encounters:  08/29/20 248 lb 9.6 oz (112.8 kg)  07/28/20 245 lb 9.6 oz (111.4 kg)  12/17/19 242 lb (109.8 kg)    Lab Results  Component Value Date   WBC 9.2 08/01/2020   HGB 16.0 08/01/2020   HCT 48.5 08/01/2020   PLT 260 08/01/2020   GLUCOSE 158 (H) 08/01/2020   CHOL 172 12/17/2019   TRIG 172.0 (H) 12/17/2019   HDL 33.10 (L) 12/17/2019   LDLDIRECT 131.0 12/31/2017   LDLCALC 104 (H) 12/17/2019   ALT 38 08/01/2020   AST 25 08/01/2020   NA 137 08/01/2020   K 4.3 08/01/2020   CL 99 08/01/2020   CREATININE 0.83 08/01/2020   BUN 10 08/01/2020   CO2 28 08/01/2020   TSH 2.53 12/31/2017   INR 0.97 09/13/2017   HGBA1C 9.3 (H) 07/28/2020   MICROALBUR 1.0 12/17/2019    Assessment/Plan:  Depression, major, single episode, moderate (HCC)  -PHQ 9 score 12 this visit.  Was a 19 on 08/29/20 -will d/c zoloft as gave pt decreased energy. -discussed trying wellbutrin 75 mg BID -pt to continue exercising and other self care when able. -consider starting counseling and restarting f/u with  BH. - Plan: buPROPion (WELLBUTRIN) 75 MG tablet  Anxiety  -GAD 7 score 11 this visit.  Was 15 on 08/29/20 -will d/c zoloft -will start wellbutrin 75 mg BID -f/u in 4-6 wks - Plan: buPROPion (WELLBUTRIN) 75 MG tablet  Type 2 diabetes mellitus with other specified complication, without long-term current use of insulin (HCC)  -hgb A1c 9.3% on 07/28/20 -continue lifestyle modifications -continue Metformin 100 mg BID and glipizide 5 mg BID.  For hypoglycemia discussed decreasing glipizide dose. - Plan: glucose blood (ONETOUCH VERIO) test strip, glucose blood (CONTOUR TEST) test  strip  Essential hypertension -controlled -continue current medications including lisinopril 5 mg -continue checking bp at home.  Other insomnia  -work schedule likely contributing as switches from days to nights q 2 days - Plan: traZODone (DESYREL) 50 MG tablet  F/u 4-6 wks  Grier Mitts, MD

## 2020-10-15 ENCOUNTER — Other Ambulatory Visit: Payer: Self-pay | Admitting: Family Medicine

## 2020-10-15 DIAGNOSIS — E119 Type 2 diabetes mellitus without complications: Secondary | ICD-10-CM

## 2020-10-15 DIAGNOSIS — I10 Essential (primary) hypertension: Secondary | ICD-10-CM

## 2020-11-01 ENCOUNTER — Telehealth: Payer: Self-pay | Admitting: Family Medicine

## 2020-11-01 NOTE — Telephone Encounter (Signed)
Pt is calling in needing a refill on Rx atorvastatin (LIPITOR) 10 MG  Pharm:  [facility] on [address] and [address]

## 2020-11-01 NOTE — Telephone Encounter (Signed)
CHANGE IN PHARMACY--[facility] on [address] and [address]

## 2020-11-02 ENCOUNTER — Other Ambulatory Visit: Payer: Self-pay

## 2020-11-02 MED ORDER — ATORVASTATIN CALCIUM 10 MG PO TABS
10.0000 mg | ORAL_TABLET | Freq: Every day | ORAL | 3 refills | Status: DC
Start: 2020-11-02 — End: 2021-11-09

## 2020-11-02 NOTE — Telephone Encounter (Signed)
Rx sent as requested.

## 2020-11-03 ENCOUNTER — Ambulatory Visit: Payer: 59 | Admitting: Family Medicine

## 2020-11-07 ENCOUNTER — Ambulatory Visit: Payer: 59 | Admitting: Family Medicine

## 2020-11-07 ENCOUNTER — Other Ambulatory Visit: Payer: Self-pay

## 2020-11-07 ENCOUNTER — Encounter: Payer: Self-pay | Admitting: Family Medicine

## 2020-11-07 VITALS — BP 108/78 | HR 88 | Temp 97.9°F | Wt 254.0 lb

## 2020-11-07 DIAGNOSIS — F419 Anxiety disorder, unspecified: Secondary | ICD-10-CM

## 2020-11-07 DIAGNOSIS — R140 Abdominal distension (gaseous): Secondary | ICD-10-CM

## 2020-11-07 DIAGNOSIS — R1011 Right upper quadrant pain: Secondary | ICD-10-CM | POA: Diagnosis not present

## 2020-11-07 DIAGNOSIS — R635 Abnormal weight gain: Secondary | ICD-10-CM | POA: Diagnosis not present

## 2020-11-07 DIAGNOSIS — E1169 Type 2 diabetes mellitus with other specified complication: Secondary | ICD-10-CM

## 2020-11-07 DIAGNOSIS — F321 Major depressive disorder, single episode, moderate: Secondary | ICD-10-CM | POA: Diagnosis not present

## 2020-11-07 LAB — CBC WITH DIFFERENTIAL/PLATELET
Basophils Absolute: 0.1 10*3/uL (ref 0.0–0.1)
Basophils Relative: 1.1 % (ref 0.0–3.0)
Eosinophils Absolute: 0.2 10*3/uL (ref 0.0–0.7)
Eosinophils Relative: 2.5 % (ref 0.0–5.0)
HCT: 45 % (ref 39.0–52.0)
Hemoglobin: 15.2 g/dL (ref 13.0–17.0)
Lymphocytes Relative: 20.2 % (ref 12.0–46.0)
Lymphs Abs: 1.9 10*3/uL (ref 0.7–4.0)
MCHC: 33.9 g/dL (ref 30.0–36.0)
MCV: 85.8 fl (ref 78.0–100.0)
Monocytes Absolute: 0.6 10*3/uL (ref 0.1–1.0)
Monocytes Relative: 7 % (ref 3.0–12.0)
Neutro Abs: 6.4 10*3/uL (ref 1.4–7.7)
Neutrophils Relative %: 69.2 % (ref 43.0–77.0)
Platelets: 234 10*3/uL (ref 150.0–400.0)
RBC: 5.24 Mil/uL (ref 4.22–5.81)
RDW: 13.6 % (ref 11.5–15.5)
WBC: 9.3 10*3/uL (ref 4.0–10.5)

## 2020-11-07 LAB — COMPREHENSIVE METABOLIC PANEL
ALT: 36 U/L (ref 0–53)
AST: 24 U/L (ref 0–37)
Albumin: 4.2 g/dL (ref 3.5–5.2)
Alkaline Phosphatase: 65 U/L (ref 39–117)
BUN: 15 mg/dL (ref 6–23)
CO2: 29 mEq/L (ref 19–32)
Calcium: 9.6 mg/dL (ref 8.4–10.5)
Chloride: 104 mEq/L (ref 96–112)
Creatinine, Ser: 0.85 mg/dL (ref 0.40–1.50)
GFR: 103.88 mL/min (ref >60.00)
Glucose, Bld: 155 mg/dL — ABNORMAL HIGH (ref 70–99)
Potassium: 4.8 mEq/L (ref 3.5–5.1)
Sodium: 139 mEq/L (ref 135–145)
Total Bilirubin: 0.4 mg/dL (ref 0.2–1.2)
Total Protein: 7.2 g/dL (ref 6.0–8.3)

## 2020-11-07 LAB — TSH: TSH: 2.66 u[IU]/mL (ref 0.35–4.50)

## 2020-11-07 LAB — LIPASE: Lipase: 133 U/L — ABNORMAL HIGH (ref 11.0–59.0)

## 2020-11-07 LAB — T4, FREE: Free T4: 0.8 ng/dL (ref 0.60–1.60)

## 2020-11-07 LAB — AMYLASE: Amylase: 67 U/L (ref 27–131)

## 2020-11-07 LAB — HEMOGLOBIN A1C: Hgb A1c MFr Bld: 7.5 % — ABNORMAL HIGH (ref 4.6–6.5)

## 2020-11-07 MED ORDER — BUPROPION HCL ER (SR) 100 MG PO TB12: 100.0000 mg | ORAL_TABLET | Freq: Two times a day (BID) | ORAL | 3 refills | Status: DC

## 2020-11-07 NOTE — Patient Instructions (Signed)
Abdominal Pain, Adult Pain in the abdomen (abdominal pain) can be caused by many things. Often, abdominal pain is not serious and it gets better with no treatment or by being treated at home. However, sometimes abdominal pain is serious. Your health care provider will ask questions about your medical history and do a physical exam to try to determine the cause of your abdominal pain. Follow these instructions at home: Medicines  Take over-the-counter and prescription medicines only as told by your health care provider.  Do not take a laxative unless told by your health care provider. General instructions  Watch your condition for any changes.  Drink enough fluid to keep your urine pale yellow.  Keep all follow-up visits as told by your health care provider. This is important.   Contact a health care provider if:  Your abdominal pain changes or gets worse.  You are not hungry or you lose weight without trying.  You are constipated or have diarrhea for more than 2-3 days.  You have pain when you urinate or have a bowel movement.  Your abdominal pain wakes you up at night.  Your pain gets worse with meals, after eating, or with certain foods.  You are vomiting and cannot keep anything down.  You have a fever.  You have blood in your urine. Get help right away if:  Your pain does not go away as soon as your health care provider told you to expect.  You cannot stop vomiting.  Your pain is only in areas of the abdomen, such as the right side or the left lower portion of the abdomen. Pain on the right side could be caused by appendicitis.  You have bloody or black stools, or stools that look like tar.  You have severe pain, cramping, or bloating in your abdomen.  You have signs of dehydration, such as: ? Dark urine, very little urine, or no urine. ? Cracked lips. ? Dry mouth. ? Sunken eyes. ? Sleepiness. ? Weakness.  You have trouble breathing or chest  pain. Summary  Often, abdominal pain is not serious and it gets better with no treatment or by being treated at home. However, sometimes abdominal pain is serious.  Watch your condition for any changes.  Take over-the-counter and prescription medicines only as told by your health care provider.  Contact a health care provider if your abdominal pain changes or gets worse.  Get help right away if you have severe pain, cramping, or bloating in your abdomen. This information is not intended to replace advice given to you by your health care provider. Make sure you discuss any questions you have with your health care provider. Document Revised: 10/30/2019 Document Reviewed: 01/19/2019 Elsevier Patient Education  2021 [place].  Preventing Unhealthy [employer], Adult Staying at a healthy weight is important to your overall health. When fat builds up in your body, you may become overweight or obese. Being overweight or obese increases your risk of developing certain health problems, such as heart disease, diabetes, sleeping problems, joint problems, and some types of cancer. Unhealthy weight gain is often the result of making unhealthy food choices or not getting enough exercise. You can make changes to your lifestyle to prevent obesity and stay as healthy as possible. What nutrition changes can be made?  Eat only as much as your body needs. To do this: ? Pay attention to signs that you are hungry or full. Stop eating as soon as you feel full. ? If you  feel hungry, try drinking water first before eating. Drink enough water so your urine is clear or pale yellow. ? Eat smaller portions. Pay attention to portion sizes when eating out. ? Look at serving sizes on food labels. Most foods contain more than one serving per container. ? Eat the recommended number of calories for your gender and activity level. For most active people, a daily total of 2,000 calories is appropriate. If you are trying  to lose weight or are not very active, you may need to eat fewer calories. Talk with your health care provider or a diet and nutrition specialist (dietitian) about how many calories you need each day.  Choose healthy foods, such as: ? Fruits and vegetables. At each meal, try to fill at least half of your plate with fruits and vegetables. ? Whole grains, such as whole-wheat bread, brown rice, and quinoa. ? Lean meats, such as chicken or fish. ? Other healthy proteins, such as beans, eggs, or tofu. ? Healthy fats, such as nuts, seeds, fatty fish, and olive oil. ? Low-fat or fat-free dairy products.  Check food labels, and avoid food and drinks that: ? Are high in calories. ? Have added sugar. ? Are high in sodium. ? Have saturated fats or trans fats.  Cook foods in healthier ways, such as by baking, broiling, or grilling.  Make a meal plan for the week, and shop with a grocery list to help you stay on track with your purchases. Try to avoid going to the grocery store when you are hungry.  When grocery shopping, try to shop around the outside of the store first, where the fresh foods are. Doing this helps you to avoid prepackaged foods, which can be high in sugar, salt (sodium), and fat.   What lifestyle changes can be made?  Exercise for 30 or more minutes on 5 or more days each week. Exercising may include brisk walking, yard work, biking, running, swimming, and team sports like basketball and soccer. Ask your health care provider which exercises are safe for you.  Do muscle-strengthening activities, such as lifting weights or using resistance bands, on 2 or more days a week.  Do not use any products that contain nicotine or tobacco, such as cigarettes and e-cigarettes. If you need help quitting, ask your health care provider.  Limit alcohol intake to no more than 1 drink a day for nonpregnant women and 2 drinks a day for men. One drink equals 12 oz of beer, 5 oz of wine, or 1 oz of hard  liquor.  Try to get 7-9 hours of sleep each night.   What other changes can be made?  Keep a food and activity journal to keep track of: ? What you ate and how many calories you had. Remember to count the calories in sauces, dressings, and side dishes. ? Whether you were active, and what exercises you did. ? Your calorie, weight, and activity goals.  Check your weight regularly. Track any changes. If you notice you have gained weight, make changes to your diet or activity routine.  Avoid taking weight-loss medicines or supplements. Talk to your health care provider before starting any new medicine or supplement.  Talk to your health care provider before trying any new diet or exercise plan. Why are these changes important? Eating healthy, staying active, and having healthy habits can help you to prevent obesity. Those changes also:  Help you manage stress and emotions.  Help you connect with friends and family.  Improve your self-esteem.  Improve your sleep.  Prevent long-term health problems. What can happen if changes are not made? Being obese or overweight can cause you to develop joint or bone problems, which can make it hard for you to stay active or do activities you enjoy. Being obese or overweight also puts stress on your heart and lungs and can lead to health problems like diabetes, heart disease, and some cancers. Where to find more information Talk with your health care provider or a dietitian about healthy eating and healthy lifestyle choices. You may also find information from:  U.S. Department of Agriculture, MyPlate: FormerBoss.no  American Heart Association: www.heart.org  Centers for Disease Control and Prevention: http://www.wolf.info/ Summary  Staying at a healthy weight is important to your overall health. It helps you to prevent certain diseases and health problems, such as heart disease, diabetes, joint problems, sleep disorders, and some types of  cancer.  Being obese or overweight can cause you to develop joint or bone problems, which can make it hard for you to stay active or do activities you enjoy.  You can prevent unhealthy weight gain by eating a healthy diet, exercising regularly, not smoking, limiting alcohol, and getting enough sleep.  Talk with your health care provider or a dietitian for guidance about healthy eating and healthy lifestyle choices. This information is not intended to replace advice given to you by your health care provider. Make sure you discuss any questions you have with your health care provider. Document Revised: 01/07/2020 Document Reviewed: 01/07/2020 Elsevier Patient Education  2021 [employer].

## 2020-11-07 NOTE — Progress Notes (Signed)
Subjective:    Patient ID: Derrick West, male    DOB: 1976/02/02, 45 y.o.   MRN: [id number]  No chief complaint on file.   HPI Patient is a 45 yo male with pmh sig for DM II, depression, anxiety, HLD who was seen for follow-up.  Pt gaining weight, eating late at night.  Has an uncontrollable urge to eat whatever is around.  Blood sugar in the 130s.  Fsbs elevation this morning as had snack food for the [employer] last night.  Taking Wellbutrin 75 mg twice daily, unsure if it is helping.  Pt having band-like epigastric pain, RUQ pain after eating, and abd bloating.  Denies constipation, increased gas, fever, chills, n/v.  Pt had his gallbladder removed ~83yrs ago.  Had pancreatitis after bile leaked into abd after procedure.  Past Medical History:  Diagnosis Date  . Anxiety and depression   . Cholecystitis   . DIABETES MELLITUS, TYPE II 12/24/2007  . Fatty liver   . GANGLION CYST, WRIST, LEFT 07/29/2007  . Headache(784.0)   . OBESITY 12/24/2007    Allergies  Allergen Reactions  . Amoxicillin Other (See Comments)    From childhood; reaction not recalled    ROS General: Denies fever, chills, night sweats, changes in weight, changes in appetite + weight gain HEENT: Denies headaches, ear pain, changes in vision, rhinorrhea, sore throat CV: Denies CP, palpitations, SOB, orthopnea Pulm: Denies SOB, cough, wheezing GI: Denies abdominal pain, nausea, vomiting, diarrhea, constipation  + RUQ abdominal pain, band-like epigastric pain, bloating GU: Denies dysuria, hematuria, frequency Msk: Denies muscle cramps, joint pains Neuro: Denies weakness, numbness, tingling Skin: Denies rashes, bruising Psych: Denies hallucinations  + anxiety, depression     Objective:    Blood pressure 108/78, pulse 88, temperature 97.9 F (36.6 C), temperature source Oral, weight 254 lb (115.2 kg), SpO2 97 %.  Gen. Pleasant, well-nourished, appears uncomfortable, nontoxic, normal affect   HEENT: [place]/AT, face  symmetric, conjunctiva clear, no scleral icterus, PERRLA, EOMI, nares patent without drainage, pharynx without erythema or exudate. Neck: No JVD, no thyromegaly, no carotid bruits Lungs: no accessory muscle use, CTAB, no wheezes or rales Cardiovascular: RRR, no m/r/g, no peripheral edema Abdomen: BS present, soft, TTP in RUQ and epigastric area, ND, no hepatosplenomegaly. Musculoskeletal: No deformities, no cyanosis or clubbing, normal tone Neuro:  A&Ox3, CN II-XII intact, normal gait Skin:  Warm, no lesions/ rash.  No jaundice   Wt Readings from Last 3 Encounters:  11/07/20 254 lb (115.2 kg)  10/03/20 250 lb 12.8 oz (113.8 kg)  08/29/20 248 lb 9.6 oz (112.8 kg)    Lab Results  Component Value Date   WBC 9.2 08/01/2020   HGB 16.0 08/01/2020   HCT 48.5 08/01/2020   PLT 260 08/01/2020   GLUCOSE 158 (H) 08/01/2020   CHOL 172 12/17/2019   TRIG 172.0 (H) 12/17/2019   HDL 33.10 (L) 12/17/2019   LDLDIRECT 131.0 12/31/2017   LDLCALC 104 (H) 12/17/2019   ALT 38 08/01/2020   AST 25 08/01/2020   NA 137 08/01/2020   K 4.3 08/01/2020   CL 99 08/01/2020   CREATININE 0.83 08/01/2020   BUN 10 08/01/2020   CO2 28 08/01/2020   TSH 2.53 12/31/2017   INR 0.97 09/13/2017   HGBA1C 9.3 (H) 07/28/2020   MICROALBUR 1.0 12/17/2019    Assessment/Plan:  RUQ pain  -Concern for pancreatitis, choledocholithiasis, hepatic dysfunction -h/o cholecystectomy -given strict precautions - Plan: US Abdomen Limited RUQ (LIVER/GB), Amylase, CBC with Differential/Platelet, CMP,  Lipase  Anxiety  -GAD 7 score 10.  Was 11 on 10/03/20 -will increase Wellbutrin from 75 mg to 100 mg -discussed counseling -f/u in 4-6 wks -given precautions - Plan: buPROPion (WELLBUTRIN SR) 100 MG 12 hr tablet  Depression, major, single episode, moderate (HCC)  -PHQ 9 score 14.   Was 12 on 10/03/20 -will increase Wellbutrin from 75 to 100 mg - Plan: buPROPion (WELLBUTRIN SR) 100 MG 12 hr tablet  Bloating  - Plan: Amylase,  CMP, Lipase  Type 2 diabetes mellitus with other specified complication, without long-term current use of insulin (HCC)  -hgb A1C was 9.35 on 07/28/20.  -continue glipizide 5 mg BID and metformin 1000 mg BID -discussed the importance of lifestyle modifications - Plan: Hemoglobin A1c  Weight gain -Discussed lifestyle modifications -continue exercise -Choose healthier options when snacking - Plan: T4, free, TSH  F/u prn in the next 1-2 wks for abd pain.  F/u in 4-6 wks for anxiety, depression  Grier Mitts, MD

## 2020-11-09 ENCOUNTER — Ambulatory Visit
Admission: RE | Admit: 2020-11-09 | Discharge: 2020-11-09 | Disposition: A | Discharge status: Home / Self Care | Payer: 59 | Source: Ambulatory Visit | Attending: Family Medicine | Admitting: Family Medicine

## 2020-11-09 DIAGNOSIS — R1011 Right upper quadrant pain: Secondary | ICD-10-CM

## 2020-11-11 ENCOUNTER — Other Ambulatory Visit: Payer: Self-pay | Admitting: Family Medicine

## 2020-11-11 DIAGNOSIS — Z20822 Contact with and (suspected) exposure to covid-19: Secondary | ICD-10-CM

## 2020-11-11 DIAGNOSIS — K760 Fatty (change of) liver, not elsewhere classified: Secondary | ICD-10-CM

## 2020-11-11 DIAGNOSIS — R748 Abnormal levels of other serum enzymes: Secondary | ICD-10-CM

## 2020-11-11 MED ORDER — PANTOPRAZOLE SODIUM 40 MG PO TBEC: 40.0000 mg | DELAYED_RELEASE_TABLET | Freq: Every day | ORAL | 0 refills | Status: DC

## 2020-11-11 NOTE — Patient Instructions (Addendum)
The order has been placed for the CT scan. While we are waiting I also sent in a prescription for pantoprazole, to the [facility] on [address] since you are noticing the pain when he eats food. This is a medication to help reduce the acid production in your stomach. An ulcer in your stomach or in your small intestine can cause the discomfort that you are having as well have caused the lipase to be elevated. You can start taking the medicine daily to see if you notice an improvement in your symptoms. If you do notice an improvement, we can hold off on the CT scan.  Fatty Liver Disease  The liver converts food into energy, removes toxic material from the blood, makes important proteins, and absorbs necessary vitamins from food. Fatty liver disease occurs when too much fat has built up in your liver cells. Fatty liver disease is also called hepatic steatosis. In many cases, fatty liver disease does not cause symptoms or problems. It is often diagnosed when tests are being done for other reasons. However, over time, fatty liver can cause inflammation that may lead to more serious liver problems, such as scarring of the liver (cirrhosis) and liver failure. Fatty liver is associated with insulin resistance, increased body fat, high blood pressure (hypertension), and high cholesterol. These are features of metabolic syndrome and increase your risk for stroke, diabetes, and heart disease. What are the causes? This condition may be caused by components of metabolic syndrome:  Obesity.  Insulin resistance.  High cholesterol. Other causes:  Alcohol abuse.  Poor nutrition.  Cushing syndrome.  Pregnancy.  Certain drugs.  Poisons.  Some viral infections. What increases the risk? You are more likely to develop this condition if you:  Abuse alcohol.  Are overweight.  Have diabetes.  Have hepatitis.  Have a high triglyceride level.  Are pregnant. What are the signs or symptoms? Fatty  liver disease often does not cause symptoms. If symptoms do develop, they can include:  Fatigue and weakness.  Weight loss.  Confusion.  Nausea, vomiting, or abdominal pain.  Yellowing of your skin and the white parts of your eyes (jaundice).  Itchy skin. How is this diagnosed? This condition may be diagnosed by:  A physical exam and your medical history.  Blood tests.  Imaging tests, such as an ultrasound, CT scan, or MRI.  A liver biopsy. A small sample of liver tissue is removed using a needle. The sample is then looked at under a microscope. How is this treated? Fatty liver disease is often caused by other health conditions. Treatment for fatty liver may involve medicines and lifestyle changes to manage conditions such as:  Alcoholism.  High cholesterol.  Diabetes.  Being overweight or obese. Follow these instructions at home:  Do not drink alcohol. If you have trouble quitting, ask your health care provider how to safely quit with the help of medicine or a supervised program. This is important to keep your condition from getting worse.  Eat a healthy diet as told by your health care provider. Ask your health care provider about working with a dietitian to develop an eating plan.  Exercise regularly. This can help you lose weight and control your cholesterol and diabetes. Talk to your health care provider about an exercise plan and which activities are best for you.  Take over-the-counter and prescription medicines only as told by your health care provider.  Keep all follow-up visits. This is important.   Contact a health care provider  if:  You have trouble controlling your: ? Blood sugar. This is especially important if you have diabetes. ? Cholesterol. ? Drinking of alcohol. Get help right away if:  You have abdominal pain.  You have jaundice.  You have nausea and are vomiting.  You vomit blood or material that looks like coffee grounds.  You have  stools that are black, tar-like, or bloody. Summary  Fatty liver disease develops when too much fat builds up in the cells of your liver.  Fatty liver disease often causes no symptoms or problems. However, over time, fatty liver can cause inflammation that may lead to more serious liver problems, such as scarring of the liver (cirrhosis).  You are more likely to develop this condition if you abuse alcohol, are pregnant, are overweight, have diabetes, have hepatitis, or have high triglyceride or cholesterol levels.  Contact your health care provider if you have trouble controlling your blood sugar, cholesterol, or drinking of alcohol. This information is not intended to replace advice given to you by your health care provider. Make sure you discuss any questions you have with your health care provider. Document Revised: 06/23/2020 Document Reviewed: 06/23/2020 Elsevier Patient Education  2021 [place].  Lipase Test Why am I having this test? The lipase test is often used to check for problems with the pancreas. Lipase is a protein (enzyme) that is released by the pancreas into the small intestine to help digest a type of fat called triglycerides. You may have this test if your health care provider suspects that you have damage to or an infection of your pancreas (pancreatitis). These problems can cause your pancreas to release more lipase. The test is often done along with other blood tests. If you have abdominal pain, your health care provider may use this test to help figure out the cause of your pain. The test can help determine if the pain is related to your pancreas or to other causes. What is being tested? This test measures the amount of lipase in your blood. What kind of sample is taken? A blood sample is required for this test. It is usually collected by inserting a needle into a blood vessel.   How do I prepare for this test?  Do not eat or drink anything except water for 8-12  hours before the test or as told by your health care provider.  Tell your health care provider about all medicines you are taking, including vitamins, herbs, eye drops, creams, and over-the-counter medicines. How are the results reported? Your test results will be reported as values that indicate the amount of lipase in your blood. Your health care provider will compare your results to normal ranges that were established after testing a large group of people (reference ranges). Reference ranges may vary among labs and hospitals. For this test, a common reference range is:  0-160 units/L or 0 to 2.67 microkatals/L in SI units. What do the results mean? Lipase levels that are higher than the reference range can be seen with many health conditions. These may include:  Disease of the pancreas.  Disease of the gallbladder.  Kidney failure.  Bowel obstruction or infarction, which means death of tissue due to a lack of blood supply.  Swelling or infection of salivary glands.  Peptic ulcer disease. Talk with your health care provider about what your results mean. Questions to ask your health care provider Ask your health care provider, or the department that is doing the test:  When will  my results be ready?  How will I get my results?  What are my treatment options?  What other tests do I need?  What are my next steps? Summary  The lipase test is often used to check for problems with the pancreas. Lipase is a protein (enzyme) that is released by the pancreas to help digest a type of fat called triglycerides.  You may have this test if your health care provider suspects that you have damage to or an infection of your pancreas (pancreatitis). Pancreatitis can cause your pancreas to release more lipase. Lipase levels that are higher than normal can indicate other health conditions, too.  Talk with your health care provider about what your results mean. This information is not intended  to replace advice given to you by your health care provider. Make sure you discuss any questions you have with your health care provider. Document Revised: 06/23/2020 Document Reviewed: 06/23/2020 Elsevier Patient Education  [place].

## 2020-11-11 NOTE — Progress Notes (Signed)
Patient called at 5:30 PM on 11/11/20 with results of lab from recent right upper quadrant ultrasound ordered for epigastric pain with food.  Patient has prior history of pancreatitis, s/p cholecystectomy. Labs with elevated lipase and ultrasound with hepatic steatosis. Discussed further evaluation with CT abdomen to further evaluate for pancreatitis. Given strict ED precautions for worsening symptoms.  Patient states he is currently quarantining as his daughter tested positive for COVID-19. Patient is awaiting his own PCR test results. Discussed the importance of hydration. Patient to start clear/bland diet and advance as tolerated. Strict precautions reiterated.  Grier Mitts, MD

## 2020-12-05 ENCOUNTER — Ambulatory Visit: Payer: 59 | Admitting: Family Medicine

## 2020-12-07 ENCOUNTER — Ambulatory Visit: Payer: 59 | Admitting: Family Medicine

## 2020-12-08 ENCOUNTER — Other Ambulatory Visit: Payer: Self-pay | Admitting: Family Medicine

## 2020-12-19 ENCOUNTER — Ambulatory Visit: Payer: 59 | Admitting: Family Medicine

## 2021-01-03 ENCOUNTER — Other Ambulatory Visit: Payer: Self-pay | Admitting: Family Medicine

## 2021-01-03 DIAGNOSIS — G4709 Other insomnia: Secondary | ICD-10-CM

## 2021-02-09 ENCOUNTER — Other Ambulatory Visit: Payer: Self-pay | Admitting: Family Medicine

## 2021-03-04 ENCOUNTER — Other Ambulatory Visit: Payer: Self-pay | Admitting: Family Medicine

## 2021-03-04 DIAGNOSIS — E119 Type 2 diabetes mellitus without complications: Secondary | ICD-10-CM

## 2021-03-10 ENCOUNTER — Other Ambulatory Visit: Payer: Self-pay | Admitting: Family Medicine

## 2021-03-10 DIAGNOSIS — F419 Anxiety disorder, unspecified: Secondary | ICD-10-CM

## 2021-03-10 DIAGNOSIS — F321 Major depressive disorder, single episode, moderate: Secondary | ICD-10-CM

## 2021-04-05 ENCOUNTER — Other Ambulatory Visit: Payer: Self-pay | Admitting: Family Medicine

## 2021-04-05 DIAGNOSIS — I10 Essential (primary) hypertension: Secondary | ICD-10-CM

## 2021-04-05 DIAGNOSIS — E119 Type 2 diabetes mellitus without complications: Secondary | ICD-10-CM

## 2021-04-15 ENCOUNTER — Other Ambulatory Visit: Payer: Self-pay | Admitting: Family Medicine

## 2021-04-15 DIAGNOSIS — F321 Major depressive disorder, single episode, moderate: Secondary | ICD-10-CM

## 2021-04-15 DIAGNOSIS — G4709 Other insomnia: Secondary | ICD-10-CM

## 2021-04-15 DIAGNOSIS — F419 Anxiety disorder, unspecified: Secondary | ICD-10-CM

## 2021-05-16 ENCOUNTER — Other Ambulatory Visit: Payer: Self-pay | Admitting: Family Medicine

## 2021-05-16 DIAGNOSIS — F419 Anxiety disorder, unspecified: Secondary | ICD-10-CM

## 2021-05-16 DIAGNOSIS — F321 Major depressive disorder, single episode, moderate: Secondary | ICD-10-CM

## 2021-06-13 ENCOUNTER — Other Ambulatory Visit: Payer: Self-pay | Admitting: Family Medicine

## 2021-06-13 DIAGNOSIS — F321 Major depressive disorder, single episode, moderate: Secondary | ICD-10-CM

## 2021-06-13 DIAGNOSIS — F419 Anxiety disorder, unspecified: Secondary | ICD-10-CM

## 2021-06-27 ENCOUNTER — Other Ambulatory Visit: Payer: Self-pay | Admitting: Family Medicine

## 2021-07-12 ENCOUNTER — Other Ambulatory Visit: Payer: Self-pay | Admitting: Family Medicine

## 2021-07-12 DIAGNOSIS — G4709 Other insomnia: Secondary | ICD-10-CM

## 2021-07-15 ENCOUNTER — Other Ambulatory Visit: Payer: Self-pay | Admitting: Family Medicine

## 2021-07-15 DIAGNOSIS — F419 Anxiety disorder, unspecified: Secondary | ICD-10-CM

## 2021-07-15 DIAGNOSIS — F321 Major depressive disorder, single episode, moderate: Secondary | ICD-10-CM

## 2021-08-01 ENCOUNTER — Other Ambulatory Visit: Payer: Self-pay | Admitting: Family Medicine

## 2021-08-26 ENCOUNTER — Other Ambulatory Visit: Payer: Self-pay | Admitting: Family Medicine

## 2021-08-26 DIAGNOSIS — E119 Type 2 diabetes mellitus without complications: Secondary | ICD-10-CM

## 2021-08-26 DIAGNOSIS — I10 Essential (primary) hypertension: Secondary | ICD-10-CM

## 2021-08-28 ENCOUNTER — Ambulatory Visit: Payer: 59 | Admitting: Family Medicine

## 2021-08-28 VITALS — BP 138/94 | HR 98 | Temp 98.1°F | Wt 262.0 lb

## 2021-08-28 DIAGNOSIS — E1169 Type 2 diabetes mellitus with other specified complication: Secondary | ICD-10-CM | POA: Diagnosis not present

## 2021-08-28 DIAGNOSIS — E65 Localized adiposity: Secondary | ICD-10-CM

## 2021-08-28 DIAGNOSIS — R635 Abnormal weight gain: Secondary | ICD-10-CM

## 2021-08-28 DIAGNOSIS — I10 Essential (primary) hypertension: Secondary | ICD-10-CM

## 2021-08-28 DIAGNOSIS — R1011 Right upper quadrant pain: Secondary | ICD-10-CM

## 2021-08-28 DIAGNOSIS — K760 Fatty (change of) liver, not elsewhere classified: Secondary | ICD-10-CM

## 2021-08-28 DIAGNOSIS — R0683 Snoring: Secondary | ICD-10-CM

## 2021-08-28 MED ORDER — PANTOPRAZOLE SODIUM 40 MG PO TBEC: DELAYED_RELEASE_TABLET | ORAL | 3 refills | Status: DC

## 2021-08-28 NOTE — Progress Notes (Signed)
Subjective:    Patient ID: Derrick West, male    DOB: [birth year], 45 y.o.   MRN: [id number]  Chief Complaint  Patient presents with   Medication Refill   Weight Check    Weight gain, been going to gym and still not helping, has gained 8lbs since february    HPI Patient was seen today for f/u on ongoing concerns and chronic conditions.  Pt last seen 10/2020.  Still having RUQ pain.  RUQ ultrasound 11/09/2020 with hepatocellular dysfunction most commonly steatosis.  Changed diet.  Exercising 3 x per wk.  Endorses hypoglycemia, fsbs 35 one night and hyperglycemia, fsbs as high as 259.  Pt cut down on carbs.  Drinking one 2L diet soda q 3 days.  Taking glipizide and metformin.  Patient notes 8 pound weight gain since last visit.  Patient stopped taking Wellbutrin ER 100 mg several months ago as he did not feel like it was helping.  Taking lisinopril 10 mg daily for BP.  Has been told he snores at night/gasp.  Denies headaches, CP, changes in vision  Past Medical History:  Diagnosis Date   Anxiety and depression    Cholecystitis    DIABETES MELLITUS, TYPE II 12/24/2007   Fatty liver    GANGLION CYST, WRIST, LEFT 07/29/2007   Headache(784.0)    OBESITY 12/24/2007    Allergies  Allergen Reactions   Amoxicillin Other (See Comments)    From childhood; reaction not recalled    ROS General: Denies fever, chills, night sweats, changes in appetite + changes in weight HEENT: Denies headaches, ear pain, changes in vision, rhinorrhea, sore throat CV: Denies CP, palpitations, SOB, orthopnea Pulm: Denies SOB, cough, wheezing GI: Denies nausea, vomiting, diarrhea, constipation + RUQ abdominal pain GU: Denies dysuria, hematuria, frequency, vaginal discharge Msk: Denies muscle cramps, joint pains Neuro: Denies weakness, numbness, tingling Skin: Denies rashes, bruising Psych: Denies depression, anxiety, hallucinations + depression     Objective:    Blood pressure (!) 138/94, pulse 98, temperature 98.1  F (36.7 C), temperature source Oral, weight 262 lb (118.8 kg), SpO2 95 %.  Gen. Pleasant, well-nourished, in no distress, normal affect   HEENT: [place]/AT, face symmetric, conjunctiva clear, no scleral icterus, PERRLA, EOMI, nares patent without drainage Lungs: no accessory muscle use, CTAB, no wheezes or rales Cardiovascular: RRR, no m/r/g, no peripheral edema Abdomen: BS present, soft, NT/ND, no hepatosplenomegaly. Musculoskeletal: No deformities, no cyanosis or clubbing, normal tone Neuro:  A&Ox3, CN II-XII intact, normal gait Skin:  Warm, no lesions/ rash  Wt Readings from Last 3 Encounters:  08/28/21 262 lb (118.8 kg)  11/07/20 254 lb (115.2 kg)  10/03/20 250 lb 12.8 oz (113.8 kg)    Lab Results  Component Value Date   WBC 9.3 11/07/2020   HGB 15.2 11/07/2020   HCT 45.0 11/07/2020   PLT 234.0 11/07/2020   GLUCOSE 155 (H) 11/07/2020   CHOL 172 12/17/2019   TRIG 172.0 (H) 12/17/2019   HDL 33.10 (L) 12/17/2019   LDLDIRECT 131.0 12/31/2017   LDLCALC 104 (H) 12/17/2019   ALT 36 11/07/2020   AST 24 11/07/2020   NA 139 11/07/2020   K 4.8 11/07/2020   CL 104 11/07/2020   CREATININE 0.85 11/07/2020   BUN 15 11/07/2020   CO2 29 11/07/2020   TSH 2.66 11/07/2020   INR 0.97 09/13/2017   HGBA1C 7.5 (H) 11/07/2020   MICROALBUR 1.0 12/17/2019    Assessment/Plan:  Hepatic steatosis  -Hepatocellular dysfunction likely steatosis noted on RUQ ultrasound  11/09/2020 -Recheck labs -Lifestyle modifications -Given handout -Discussed follow-up with GI - Plan: CMP, Lipase, Amylase, Gamma GT, Ambulatory referral to Gastroenterology  Weight gain  -Central obesity, 8 pound weight gain since 2/22 -Discussed lifestyle modification.  Patient encouraged to continue exercising -Possibly related to insulin resistance - Plan: Hemoglobin A1c, TSH, T4, Free, Vitamin D, 25-hydroxy, Lipid panel, Ambulatory referral to Gastroenterology  Type 2 diabetes mellitus with other specified complication,  without long-term current use of insulin (HCC)  -Uncontrolled -Having hyperglycemia with episodes of hypoglycemia.  Discussed the importance of not skipping meals especially when taking medication -Discussed the importance of monitoring blood sugar for medication adjustments to be made -Continue glipizide 5 mg twice daily and metformin 1000 mg twice daily. -Patient to notify clinic for continued hypoglycemic episodes. -Discussed other medication options.  Cost and convenience factors if adjustments needed. - Plan: Hemoglobin A1c, Lipid panel  Essential hypertension  -elevated -Likely multifactorial including weight gain, sodium intake, possible OSA -Lifestyle modifications -Continue lisinopril 10 mg daily -Discussed monitoring BP at home and keeping a log to bring with you to clinic as medication adjustments likely needed. 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

## 2021-08-29 LAB — COMPREHENSIVE METABOLIC PANEL
ALT: 34 U/L (ref 0–53)
AST: 24 U/L (ref 0–37)
Albumin: 4.3 g/dL (ref 3.5–5.2)
Alkaline Phosphatase: 70 U/L (ref 39–117)
BUN: 14 mg/dL (ref 6–23)
CO2: 28 mEq/L (ref 19–32)
Calcium: 9.7 mg/dL (ref 8.4–10.5)
Chloride: 99 mEq/L (ref 96–112)
Creatinine, Ser: 0.94 mg/dL (ref 0.40–1.50)
GFR: 96.37 mL/min (ref >60.00)
Glucose, Bld: 65 mg/dL — ABNORMAL LOW (ref 70–99)
Potassium: 4.2 mEq/L (ref 3.5–5.1)
Sodium: 137 mEq/L (ref 135–145)
Total Bilirubin: 0.6 mg/dL (ref 0.2–1.2)
Total Protein: 7 g/dL (ref 6.0–8.3)

## 2021-08-29 LAB — TSH: TSH: 1.58 u[IU]/mL (ref 0.35–5.50)

## 2021-08-29 LAB — CBC WITH DIFFERENTIAL/PLATELET
Basophils Absolute: 0.1 10*3/uL (ref 0.0–0.1)
Basophils Relative: 1 % (ref 0.0–3.0)
Eosinophils Absolute: 0.3 10*3/uL (ref 0.0–0.7)
Eosinophils Relative: 2.5 % (ref 0.0–5.0)
HCT: 47 % (ref 39.0–52.0)
Hemoglobin: 15.6 g/dL (ref 13.0–17.0)
Lymphocytes Relative: 24.9 % (ref 12.0–46.0)
Lymphs Abs: 2.9 10*3/uL (ref 0.7–4.0)
MCHC: 33.2 g/dL (ref 30.0–36.0)
MCV: 84 fl (ref 78.0–100.0)
Monocytes Absolute: 0.9 10*3/uL (ref 0.1–1.0)
Monocytes Relative: 7.5 % (ref 3.0–12.0)
Neutro Abs: 7.3 10*3/uL (ref 1.4–7.7)
Neutrophils Relative %: 64.1 % (ref 43.0–77.0)
Platelets: 290 10*3/uL (ref 150.0–400.0)
RBC: 5.59 Mil/uL (ref 4.22–5.81)
RDW: 13.7 % (ref 11.5–15.5)
WBC: 11.5 10*3/uL — ABNORMAL HIGH (ref 4.0–10.5)

## 2021-08-29 LAB — LIPID PANEL
Cholesterol: 177 mg/dL (ref 0–200)
HDL: 35.7 mg/dL — ABNORMAL LOW (ref >39.00)
LDL Cholesterol: 107 mg/dL — ABNORMAL HIGH (ref 0–99)
NonHDL: 141.14
Total CHOL/HDL Ratio: 5
Triglycerides: 171 mg/dL — ABNORMAL HIGH (ref 0.0–149.0)
VLDL: 34.2 mg/dL (ref 0.0–40.0)

## 2021-08-29 LAB — VITAMIN D 25 HYDROXY (VIT D DEFICIENCY, FRACTURES): VITD: 20.03 ng/mL — ABNORMAL LOW (ref 30.00–100.00)

## 2021-08-29 LAB — AMYLASE: Amylase: 33 U/L (ref 27–131)

## 2021-08-29 LAB — HEMOGLOBIN A1C: Hgb A1c MFr Bld: 7.1 % — ABNORMAL HIGH (ref 4.6–6.5)

## 2021-08-29 LAB — LIPASE: Lipase: 36 U/L (ref 11.0–59.0)

## 2021-08-29 LAB — T4, FREE: Free T4: 0.74 ng/dL (ref 0.60–1.60)

## 2021-08-29 LAB — GAMMA GT: GGT: 39 U/L (ref 7–51)

## 2021-08-31 ENCOUNTER — Other Ambulatory Visit: Payer: Self-pay | Admitting: Family Medicine

## 2021-08-31 DIAGNOSIS — E559 Vitamin D deficiency, unspecified: Secondary | ICD-10-CM

## 2021-08-31 MED ORDER — VITAMIN D (ERGOCALCIFEROL) 1.25 MG (50000 UNIT) PO CAPS: 50000.0000 [IU] | ORAL_CAPSULE | ORAL | 0 refills | Status: DC

## 2021-09-09 ENCOUNTER — Other Ambulatory Visit: Payer: Self-pay | Admitting: Family Medicine

## 2021-09-09 DIAGNOSIS — F321 Major depressive disorder, single episode, moderate: Secondary | ICD-10-CM

## 2021-09-09 DIAGNOSIS — F419 Anxiety disorder, unspecified: Secondary | ICD-10-CM

## 2021-09-13 ENCOUNTER — Encounter: Payer: Self-pay | Admitting: Family Medicine

## 2021-10-09 ENCOUNTER — Ambulatory Visit: Payer: 59 | Admitting: Family Medicine

## 2021-11-09 ENCOUNTER — Other Ambulatory Visit: Payer: Self-pay | Admitting: Family Medicine

## 2021-11-20 ENCOUNTER — Institutional Professional Consult (permissible substitution): Payer: 59 | Admitting: Primary Care

## 2021-11-23 ENCOUNTER — Institutional Professional Consult (permissible substitution): Payer: 59 | Admitting: Adult Health

## 2021-11-27 ENCOUNTER — Other Ambulatory Visit: Payer: Self-pay | Admitting: Family Medicine

## 2021-11-27 DIAGNOSIS — R1011 Right upper quadrant pain: Secondary | ICD-10-CM

## 2022-01-31 ENCOUNTER — Ambulatory Visit: Payer: Self-pay

## 2022-01-31 ENCOUNTER — Other Ambulatory Visit: Payer: Self-pay | Admitting: Family Medicine

## 2022-01-31 DIAGNOSIS — Z0000 Encounter for general adult medical examination without abnormal findings: Secondary | ICD-10-CM

## 2022-02-09 ENCOUNTER — Other Ambulatory Visit: Payer: Self-pay | Admitting: Family Medicine

## 2022-04-13 ENCOUNTER — Other Ambulatory Visit: Payer: Self-pay | Admitting: Family Medicine

## 2022-04-13 DIAGNOSIS — I10 Essential (primary) hypertension: Secondary | ICD-10-CM

## 2022-04-13 DIAGNOSIS — E119 Type 2 diabetes mellitus without complications: Secondary | ICD-10-CM

## 2022-04-14 ENCOUNTER — Other Ambulatory Visit: Payer: Self-pay | Admitting: Family Medicine

## 2022-04-14 DIAGNOSIS — E119 Type 2 diabetes mellitus without complications: Secondary | ICD-10-CM

## 2022-05-07 ENCOUNTER — Other Ambulatory Visit: Payer: Self-pay | Admitting: Family Medicine

## 2022-05-08 ENCOUNTER — Other Ambulatory Visit: Payer: Self-pay | Admitting: Family Medicine

## 2022-05-08 DIAGNOSIS — E119 Type 2 diabetes mellitus without complications: Secondary | ICD-10-CM

## 2022-05-08 DIAGNOSIS — I10 Essential (primary) hypertension: Secondary | ICD-10-CM

## 2022-06-14 ENCOUNTER — Other Ambulatory Visit: Payer: Self-pay | Admitting: Family Medicine

## 2022-06-14 DIAGNOSIS — E119 Type 2 diabetes mellitus without complications: Secondary | ICD-10-CM

## 2022-06-14 DIAGNOSIS — I10 Essential (primary) hypertension: Secondary | ICD-10-CM

## 2022-06-17 ENCOUNTER — Other Ambulatory Visit: Payer: Self-pay | Admitting: Family Medicine

## 2022-06-19 ENCOUNTER — Other Ambulatory Visit: Payer: Self-pay

## 2022-06-19 MED ORDER — ATORVASTATIN CALCIUM 10 MG PO TABS: ORAL_TABLET | ORAL | 0 refills | Status: DC

## 2022-06-22 ENCOUNTER — Other Ambulatory Visit: Payer: Self-pay | Admitting: Family Medicine

## 2022-06-22 DIAGNOSIS — E119 Type 2 diabetes mellitus without complications: Secondary | ICD-10-CM

## 2022-07-14 ENCOUNTER — Other Ambulatory Visit: Payer: Self-pay | Admitting: Family Medicine

## 2022-07-14 DIAGNOSIS — I10 Essential (primary) hypertension: Secondary | ICD-10-CM

## 2022-07-14 DIAGNOSIS — E119 Type 2 diabetes mellitus without complications: Secondary | ICD-10-CM

## 2022-07-19 ENCOUNTER — Ambulatory Visit (INDEPENDENT_AMBULATORY_CARE_PROVIDER_SITE_OTHER): Payer: 59 | Admitting: Family Medicine

## 2022-07-19 VITALS — BP 98/52 | HR 83 | Temp 98.5°F | Ht 68.0 in | Wt 191.0 lb

## 2022-07-19 DIAGNOSIS — Z1211 Encounter for screening for malignant neoplasm of colon: Secondary | ICD-10-CM

## 2022-07-19 DIAGNOSIS — E1169 Type 2 diabetes mellitus with other specified complication: Secondary | ICD-10-CM

## 2022-07-19 DIAGNOSIS — R634 Abnormal weight loss: Secondary | ICD-10-CM

## 2022-07-19 DIAGNOSIS — K760 Fatty (change of) liver, not elsewhere classified: Secondary | ICD-10-CM | POA: Diagnosis not present

## 2022-07-19 DIAGNOSIS — Z0000 Encounter for general adult medical examination without abnormal findings: Secondary | ICD-10-CM | POA: Diagnosis not present

## 2022-07-19 DIAGNOSIS — I10 Essential (primary) hypertension: Secondary | ICD-10-CM | POA: Diagnosis not present

## 2022-07-19 DIAGNOSIS — L309 Dermatitis, unspecified: Secondary | ICD-10-CM

## 2022-07-19 DIAGNOSIS — R1084 Generalized abdominal pain: Secondary | ICD-10-CM

## 2022-07-19 DIAGNOSIS — E782 Mixed hyperlipidemia: Secondary | ICD-10-CM | POA: Diagnosis not present

## 2022-07-19 LAB — MICROALBUMIN / CREATININE URINE RATIO
Creatinine,U: 539.1 mg/dL
Microalb Creat Ratio: 2.2 mg/g (ref 0.0–30.0)
Microalb, Ur: 11.9 mg/dL — ABNORMAL HIGH (ref 0.0–1.9)

## 2022-07-19 LAB — CBC WITH DIFFERENTIAL/PLATELET
Basophils Absolute: 0.1 10*3/uL (ref 0.0–0.1)
Basophils Relative: 0.8 % (ref 0.0–3.0)
Eosinophils Absolute: 0.1 10*3/uL (ref 0.0–0.7)
Eosinophils Relative: 1.5 % (ref 0.0–5.0)
HCT: 43.7 % (ref 39.0–52.0)
Hemoglobin: 14.7 g/dL (ref 13.0–17.0)
Lymphocytes Relative: 23 % (ref 12.0–46.0)
Lymphs Abs: 2.1 10*3/uL (ref 0.7–4.0)
MCHC: 33.5 g/dL (ref 30.0–36.0)
MCV: 87.7 fl (ref 78.0–100.0)
Monocytes Absolute: 0.5 10*3/uL (ref 0.1–1.0)
Monocytes Relative: 5.7 % (ref 3.0–12.0)
Neutro Abs: 6.3 10*3/uL (ref 1.4–7.7)
Neutrophils Relative %: 69 % (ref 43.0–77.0)
Platelets: 276 10*3/uL (ref 150.0–400.0)
RBC: 4.99 Mil/uL (ref 4.22–5.81)
RDW: 13.2 % (ref 11.5–15.5)
WBC: 9.2 10*3/uL (ref 4.0–10.5)

## 2022-07-19 LAB — COMPREHENSIVE METABOLIC PANEL
ALT: 24 U/L (ref 0–53)
AST: 19 U/L (ref 0–37)
Albumin: 4.3 g/dL (ref 3.5–5.2)
Alkaline Phosphatase: 58 U/L (ref 39–117)
BUN: 20 mg/dL (ref 6–23)
CO2: 27 mEq/L (ref 19–32)
Calcium: 9.6 mg/dL (ref 8.4–10.5)
Chloride: 105 mEq/L (ref 96–112)
Creatinine, Ser: 1.09 mg/dL (ref 0.40–1.50)
GFR: 80.18 mL/min (ref >60.00)
Glucose, Bld: 119 mg/dL — ABNORMAL HIGH (ref 70–99)
Potassium: 4.7 mEq/L (ref 3.5–5.1)
Sodium: 139 mEq/L (ref 135–145)
Total Bilirubin: 0.6 mg/dL (ref 0.2–1.2)
Total Protein: 6.9 g/dL (ref 6.0–8.3)

## 2022-07-19 LAB — LIPASE: Lipase: 28 U/L (ref 11.0–59.0)

## 2022-07-19 LAB — LIPID PANEL
Cholesterol: 162 mg/dL (ref 0–200)
HDL: 40.3 mg/dL (ref >39.00)
LDL Cholesterol: 107 mg/dL — ABNORMAL HIGH (ref 0–99)
NonHDL: 121.48
Total CHOL/HDL Ratio: 4
Triglycerides: 70 mg/dL (ref 0.0–149.0)
VLDL: 14 mg/dL (ref 0.0–40.0)

## 2022-07-19 LAB — TSH: TSH: 1.5 u[IU]/mL (ref 0.35–5.50)

## 2022-07-19 LAB — T4, FREE: Free T4: 0.82 ng/dL (ref 0.60–1.60)

## 2022-07-19 LAB — HEMOGLOBIN A1C: Hgb A1c MFr Bld: 6.4 % (ref 4.6–6.5)

## 2022-07-19 MED ORDER — METFORMIN HCL 1000 MG PO TABS: 1000.0000 mg | ORAL_TABLET | Freq: Two times a day (BID) | ORAL | 3 refills | Status: DC

## 2022-07-19 MED ORDER — LISINOPRIL 10 MG PO TABS: 10.0000 mg | ORAL_TABLET | Freq: Every day | ORAL | 3 refills | Status: DC

## 2022-07-19 MED ORDER — ONETOUCH VERIO VI STRP: ORAL_STRIP | 1 refills | Status: DC

## 2022-07-19 MED ORDER — TRIAMCINOLONE ACETONIDE 0.1 % EX CREA: 1.0000 | TOPICAL_CREAM | Freq: Two times a day (BID) | CUTANEOUS | 0 refills | Status: DC

## 2022-07-19 MED ORDER — ATORVASTATIN CALCIUM 10 MG PO TABS: ORAL_TABLET | ORAL | 3 refills | Status: DC

## 2022-07-19 NOTE — Progress Notes (Signed)
Subjective:     Derrick West is a 46 y.o. male and is here for a comprehensive physical exam.  Pt endorses b/l pain and edema of anterior arms at elbow.  Noticed several months ago.  No changes in activity.  Throwing softball with his daughter more.  At times has shooting pain in lateral forearm to elbow.  Having to put down heavy items as may drop them.  Pt endorses area of dried skin on b/l ankles.  In the past given rx for triamcinolone which helps.  Requesting refill.  Pt losing wt.  No longer taking glipizide or protonix.    Social History   Socioeconomic History   Marital status: Widowed    Spouse name: Not on file   Number of children: 1   Years of education: Not on file   Highest education level: 12th grade  Occupational History   Occupation: tech (shift leader)  Tobacco Use   Smoking status: Never   Smokeless tobacco: Never  Vaping Use   Vaping Use: Never used  Substance and Sexual Activity   Alcohol use: Yes    Comment: occ   Drug use: No   Sexual activity: Not on file  Other Topics Concern   Not on file  Social History Narrative   Not on file   Social Determinants of Health   Financial Resource Strain: Medium Risk (08/27/2021)   Overall Financial Resource Strain (CARDIA)    Difficulty of Paying Living Expenses: Somewhat hard  Food Insecurity: Unknown (08/27/2021)   Hunger Vital Sign    Worried About Running Out of Food in the Last Year: Never true    Ran Out of Food in the Last Year: Patient refused  Transportation Needs: No Transportation Needs (08/27/2021)   PRAPARE - Hydrologist (Medical): No    Lack of Transportation (Non-Medical): No  Physical Activity: Insufficiently Active (08/27/2021)   Exercise Vital Sign    Days of Exercise per Week: 3 days    Minutes of Exercise per Session: 30 min  Stress: Stress Concern Present (08/27/2021)   [place]    Feeling of  Stress : Very much  Social Connections: Unknown (08/27/2021)   Social Connection and Isolation Panel [NHANES]    Frequency of Communication with Friends and Family: Once a week    Frequency of Social Gatherings with Friends and Family: Patient refused    Attends Religious Services: Never    Marine scientist or Organizations: Patient refused    Attends Archivist Meetings: Not on file    Marital Status: Widowed  Intimate Partner Violence: Not on file   Health Maintenance  Topic Date Due   Hepatitis C Screening  Never done   TETANUS/TDAP  Never done   COLONOSCOPY (Pts 45-13yr Insurance coverage will need to be confirmed)  Never done   COVID-19 Vaccine (4 - Pfizer series) 11/16/2020   Diabetic kidney evaluation - Urine ACR  12/16/2020   FOOT EXAM  12/16/2020   OPHTHALMOLOGY EXAM  01/25/2021   HEMOGLOBIN A1C  02/26/2022   INFLUENZA VACCINE  12/23/2022 (Originally 04/24/2022)   Diabetic kidney evaluation - GFR measurement  08/28/2022   HIV Screening  Completed   HPV VACCINES  Aged Out    The following portions of the patient's history were reviewed and updated as appropriate: allergies, current medications, past family history, past medical history, past social history, past surgical history, and problem list.  Review of Systems Pertinent items noted in HPI and remainder of comprehensive ROS otherwise negative.   Objective:    BP (!) 98/52 (BP Location: Right Arm, Patient Position: Sitting, Cuff Size: Normal)   Pulse 83   Temp 98.5 F (36.9 C) (Oral)   Ht '5\' 8"'$  (1.727 m)   Wt 191 lb (86.6 kg)   SpO2 97%   BMI 29.04 kg/m  General appearance: alert, cooperative, and no distress Head: Normocephalic, without obvious abnormality, atraumatic Eyes: conjunctivae/corneas clear. PERRL, EOM's intact. Fundi benign. Ears: normal TM's and external ear canals both ears Nose: Nares normal. Septum midline. Mucosa normal. No drainage or sinus tenderness. Throat: lips, mucosa,  and tongue normal; teeth and gums normal Neck: no adenopathy, no carotid bruit, no JVD, supple, symmetrical, trachea midline, and thyroid not enlarged, symmetric, no tenderness/mass/nodules Lungs: clear to auscultation bilaterally Heart: regular rate and rhythm, S1, S2 normal, no murmur, click, rub or gallop Abdomen: soft, ND, mild TTP in LUQ and LLQ, no masses, or hepatosplenomegaly.   Extremities: extremities normal, atraumatic, no cyanosis or edema and TTP of R lateral proximal forearm  at ac fossa.   Pulses: 2+ and symmetric Skin: Skin color, texture, turgor normal. No rashes or lesions Lymph nodes: Cervical, supraclavicular, and axillary nodes normal. Neurologic: Alert and oriented X 3, normal strength and tone. Normal symmetric reflexes. Normal coordination and gait    Assessment:    Healthy male exam     Plan:    Anticipatory guidance given including wearing seatbelts, smoke detectors in the home, increasing physical activity, increasing p.o. intake of water and vegetables. -labs -cologuard for colon cancer screen. -immunizations reviewed -given handout -Next CPE in 1 yr See After Visit Summary for Counseling Recommendations  Well adult exam  Essential hypertension  -controlled -continue lisinopril 10 mg  -continue lifestyle modifications - Plan: CMP, lisinopril (ZESTRIL) 10 MG tablet  Hepatic steatosis  - Plan: CMP, CBC with Differential/Platelet, CBC with Differential/Platelet  Type 2 diabetes mellitus with other specified complication, without long-term current use of insulin (HCC) -hgb A1c 7.1% on 08/28/21 -pt encouraged to schedule eye exam -continue metformin   - Plan: metFORMIN (GLUCOPHAGE) 1000 MG tablet, Hemoglobin A1c, glucose blood (ONETOUCH VERIO) test strip, Microalbumin/Creatinine Ratio, Urine  Generalized abdominal pain  -possibly 2/2 h/o hepatic steatosis - Plan: CMP, CBC with Differential/Platelet, Lipase, CBC with Differential/Platelet  Mixed  hyperlipidemia  - Plan: atorvastatin (LIPITOR) 10 MG tablet, Lipid panel  Dermatitis  - Plan: triamcinolone cream (KENALOG) 0.1 %  Weight loss  - Plan: TSH, T4, Free  Colon cancer screening  - Plan: Cologuard  F/u in 4 months  Grier Mitts, MD

## 2022-07-19 NOTE — Patient Instructions (Addendum)
Given your current weight loss we can decrease your blood pressure pill, lisinopril from 10 mg to 5 mg daily.  Monitor blood pressure at home and keep a log to bring with you to the next clinic visit.  We did some stretching exercises and information about your elbow pain.  For continued symptoms please notify the clinic and send them a short course of prednisone to help with the symptoms

## 2022-07-30 ENCOUNTER — Encounter: Payer: Self-pay | Admitting: Family Medicine

## 2022-08-01 ENCOUNTER — Telehealth (INDEPENDENT_AMBULATORY_CARE_PROVIDER_SITE_OTHER): Payer: 59 | Admitting: Family Medicine

## 2022-08-01 ENCOUNTER — Encounter: Payer: Self-pay | Admitting: Family Medicine

## 2022-08-01 DIAGNOSIS — E118 Type 2 diabetes mellitus with unspecified complications: Secondary | ICD-10-CM

## 2022-08-01 NOTE — Progress Notes (Signed)
Virtual Visit via Telephone Note  I connected with Derrick West on 08/01/22 at  4:45 PM EST by telephone and verified that I am speaking with the correct person using two identifiers.   I discussed the limitations, risks, security and privacy concerns of performing an evaluation and management service by telephone and the availability of in person appointments. I also discussed with the patient that there may be a patient responsible charge related to this service. The patient expressed understanding and agreed to proceed.  Location patient: home Location provider: work or home office Participants present for the call: patient, provider Patient did not have a visit in the prior 7 days to address this/these issue(s).  Chief Complaint  Patient presents with   Medication Refill    Wants to dicuss once weekly medications for DM. Sent mychart message asking about mounjaro     History of Present Illness: Pt concerned about A1C per [employer].  Was worried he may need additional medication.  States numbers were trending up. Pt wants to make sure his blood sugar is controlled as his wife dealt with DM.  Currently taking metformin 1000 mg twice daily.  Patient states at times he forgets to take medication.  Doing strict diet changes such as decreasing carb intake but does not feel like he can manage these long-term.   Observations/Objective: Patient sounds cheerful and well on the phone. I do not appreciate any SOB. Speech and thought processing are grossly intact. Patient reported vitals:  Assessment and Plan: Controlled type 2 diabetes mellitus with complication, without long-term current use of insulin (HCC) -hgb A1C 6.4% on 07/19/2022.  Hemoglobin A1c previously 7.1% from 08/28/2021. -Patient reassured.  Advised if would like A1c checked sooner than 6 months could be done but insurance might not cover cost as DM currently well controlled. -Continue lifestyle modifications. -Continue  metformin 1000 mg twice daily. -If additional glycemic control needed in the future consider other p.o. medications.  Would be hesitant to start a weekly injectable medication for DM 2/2 patient's current history of weight loss as mentioned at [facility] 07/19/2022.  Cost also likely to be a limiting factor.  Follow Up Instructions:  F/u in 3 months, sooner if needed  [id number] [id number] [id number] 11-20 [id number] 21-30 I did not refer this patient for an OV in the next 24 hours for this/these issue(s).  I discussed the assessment and treatment plan with the patient. The patient was provided an opportunity to ask questions and all were answered. The patient agreed with the plan and demonstrated an understanding of the instructions.   The patient was advised to call back or seek an in-person evaluation if the symptoms worsen or if the condition fails to improve as anticipated.  I provided 8:32 minutes of non-face-to-face time during this encounter.   Billie Ruddy, MD

## 2022-08-03 ENCOUNTER — Emergency Department (HOSPITAL_BASED_OUTPATIENT_CLINIC_OR_DEPARTMENT_OTHER): Payer: 59 | Admitting: Radiology

## 2022-08-03 ENCOUNTER — Encounter (HOSPITAL_BASED_OUTPATIENT_CLINIC_OR_DEPARTMENT_OTHER): Payer: Self-pay | Admitting: Emergency Medicine

## 2022-08-03 ENCOUNTER — Emergency Department (HOSPITAL_BASED_OUTPATIENT_CLINIC_OR_DEPARTMENT_OTHER)
Admission: EM | Admit: 2022-08-03 | Discharge: 2022-08-03 | Disposition: A | Discharge status: Home / Self Care | Payer: 59 | Attending: Emergency Medicine | Admitting: Emergency Medicine

## 2022-08-03 DIAGNOSIS — M79632 Pain in left forearm: Secondary | ICD-10-CM | POA: Insufficient documentation

## 2022-08-03 DIAGNOSIS — R072 Precordial pain: Secondary | ICD-10-CM | POA: Insufficient documentation

## 2022-08-03 DIAGNOSIS — R079 Chest pain, unspecified: Secondary | ICD-10-CM

## 2022-08-03 DIAGNOSIS — E119 Type 2 diabetes mellitus without complications: Secondary | ICD-10-CM | POA: Insufficient documentation

## 2022-08-03 LAB — BASIC METABOLIC PANEL
Anion gap: 11 (ref 5–15)
BUN: 25 mg/dL — ABNORMAL HIGH (ref 6–20)
CO2: 24 mmol/L (ref 22–32)
Calcium: 9.5 mg/dL (ref 8.9–10.3)
Chloride: 106 mmol/L (ref 98–111)
Creatinine, Ser: 0.9 mg/dL (ref 0.61–1.24)
GFR, Estimated: >60 mL/min (ref >60)
Glucose, Bld: 146 mg/dL — ABNORMAL HIGH (ref 70–99)
Potassium: 4.3 mmol/L (ref 3.5–5.1)
Sodium: 141 mmol/L (ref 135–145)

## 2022-08-03 LAB — CBC
HCT: 43.1 % (ref 39.0–52.0)
Hemoglobin: 14.4 g/dL (ref 13.0–17.0)
MCH: 30.3 pg (ref 26.0–34.0)
MCHC: 33.4 g/dL (ref 30.0–36.0)
MCV: 90.5 fL (ref 80.0–100.0)
Platelets: 235 10*3/uL (ref 150–400)
RBC: 4.76 MIL/uL (ref 4.22–5.81)
RDW: 12.4 % (ref 11.5–15.5)
WBC: 7.7 10*3/uL (ref 4.0–10.5)
nRBC: 0 % (ref 0.0–0.2)

## 2022-08-03 LAB — TROPONIN I (HIGH SENSITIVITY)
Troponin I (High Sensitivity): 4 ng/L (ref <18)
Troponin I (High Sensitivity): 3 ng/L (ref <18)

## 2022-08-03 NOTE — ED Triage Notes (Signed)
Centralized chest pain x 1 hour, described as tightness

## 2022-08-03 NOTE — ED Provider Notes (Signed)
[facility] EMERGENCY DEPT Provider Note   CSN: [id number] Arrival date & time: 08/03/22  1024     History  Chief Complaint  Patient presents with   Chest Pain    Derrick West is a 46 y.o. male.  Patient presents emergency department complaining of substernal chest pain described as a tightness.  He states it started at 9 AM this morning.  At the time of onset he states he was standing in his kitchen with no significant exertion.  Pain was initially 6 or 7 out of 10 in severity.  He does endorse left forearm pain that started approximately same time.  He denies shortness of breath, nausea, vomiting, abdominal pain.  Patient states at time of my examination that his pain is 4 or 5 out of 10 in severity.  Patient does endorse history of a heart surgery he was 46 years old but has had no follow-up with cardiology as an adult and no recent cardiac issues.  Past medical history seen for type 2 diabetes on metformin, obesity, anxiety, depression, cholecystitis, fatty liver  HPI     Home Medications Prior to Admission medications   Medication Sig Start Date End Date Taking? Authorizing Provider  atorvastatin (LIPITOR) 10 MG tablet TAKE 1 TABLET(10 MG) BY MOUTH DAILY 07/19/22   Billie Ruddy, MD  glucose blood (ONETOUCH VERIO) test strip Use as instructed 07/19/22   Billie Ruddy, MD  ibuprofen (ADVIL,MOTRIN) 200 MG tablet Take 400 mg by mouth every 6 (six) hours as needed (for pain).    [provider]  lisinopril (ZESTRIL) 10 MG tablet Take 1 tablet (10 mg total) by mouth daily. 07/19/22   Billie Ruddy, MD  metFORMIN (GLUCOPHAGE) 1000 MG tablet Take 1 tablet (1,000 mg total) by mouth 2 (two) times daily with a meal. 07/19/22   Billie Ruddy, MD  triamcinolone cream (KENALOG) 0.1 % Apply 1 Application topically 2 (two) times daily. 07/19/22   Billie Ruddy, MD      Allergies    Amoxicillin    Review of Systems   Review of Systems  Respiratory:   Negative for shortness of breath.   Cardiovascular:  Positive for chest pain.  Gastrointestinal:  Negative for abdominal pain, nausea and vomiting.    Physical Exam Updated Vital Signs BP 120/67   Pulse 65   Temp 98 F (36.7 C) (Oral)   Resp (!) 22   Ht '5\' 8"'$  (1.727 m)   Wt 86.6 kg   SpO2 99%   BMI 29.04 kg/m  Physical Exam Vitals and nursing note reviewed.  Constitutional:      General: He is not in acute distress.    Appearance: He is well-developed.  HENT:     Head: Normocephalic and atraumatic.  Eyes:     Conjunctiva/sclera: Conjunctivae normal.  Cardiovascular:     Rate and Rhythm: Normal rate and regular rhythm.     Heart sounds: No murmur heard. Pulmonary:     Effort: Pulmonary effort is normal. No respiratory distress.     Breath sounds: Normal breath sounds.  Abdominal:     Palpations: Abdomen is soft.     Tenderness: There is no abdominal tenderness.  Musculoskeletal:        General: No swelling.     Cervical back: Neck supple.  Skin:    General: Skin is warm and dry.     Capillary Refill: Capillary refill takes less than 2 seconds.  Neurological:  Mental Status: He is alert.  Psychiatric:        Mood and Affect: Mood normal.     ED Results / Procedures / Treatments   Labs (all labs ordered are listed, but only abnormal results are displayed) Labs Reviewed  BASIC METABOLIC PANEL - Abnormal; Notable for the following components:      Result Value   Glucose, Bld 146 (*)    BUN 25 (*)    All other components within normal limits  CBC  TROPONIN I (HIGH SENSITIVITY)  TROPONIN I (HIGH SENSITIVITY)    EKG None  Radiology DG Chest 2 View  Result Date: 08/03/2022 CLINICAL DATA:  Chest pain. EXAM: CHEST - 2 VIEW COMPARISON:  Chest x-ray dated Jan 31, 2022. FINDINGS: The heart size and mediastinal contours are within normal limits. Both lungs are clear. The visualized skeletal structures are unremarkable. IMPRESSION: No active cardiopulmonary  disease. Electronically Signed   By: Titus Dubin M.D.   On: 08/03/2022 11:00    Procedures Procedures    Medications Ordered in ED Medications - No data to display  ED Course/ Medical Decision Making/ A&P                           Medical Decision Making Amount and/or Complexity of Data Reviewed Labs: ordered. Radiology: ordered.   This patient presents to the ED for concern of chest pain, this involves an extensive number of treatment options, and is a complaint that carries with it a high risk of complications and morbidity.  The differential diagnosis includes ACS, PE, dissection, pneumonia, musculoskeletal pain, anxiety, and others   Co morbidities that complicate the patient evaluation  History of heart surgery as a juvenile, type II DM   Additional history obtained:   External records from outside source obtained and reviewed including primary care notes over the past week due to type 2 diabetes   Lab Tests:  I Ordered, and personally interpreted labs.  The pertinent results include: Initial troponin 4, repeat troponin 3, unremarkable CBC, unremarkable BMP   Imaging Studies ordered:  I ordered imaging studies including chest x-ray I independently visualized and interpreted imaging which showed no active cardiopulmonary disease I agree with the radiologist interpretation   Cardiac Monitoring: / EKG:  The patient was maintained on a cardiac monitor.  I personally viewed and interpreted the cardiac monitored which showed an underlying rhythm of: Normal sinus rhythm   Test / Admission - Considered:  Negative troponins and no ischemic changes on EKG.  Very low clinical suspicion of ACS.  No shortness of breath, tachycardia, or sharp pains to make me think pulmonary embolism at this time.  No clinical signs of dissection at this time.  No pneumonia on chest x-ray.  No tenderness to palpation or known aggravating factor to suggest musculoskeletal cause.  Unclear  etiology at this time.  Patient does appear stable.  Vitals are all normal.  I feel at this time the patient may discharge and follow-up as an outpatient with cardiology.  Cardiology referral placed.  Patient given return precautions including worsening chest pain or shortness of breath.        Final Clinical Impression(s) / ED Diagnoses Final diagnoses:  Chest pain, unspecified type    Rx / DC Orders ED Discharge Orders          Ordered    Ambulatory referral to Cardiology       Comments: If you have not  heard from the Cardiology office within the next 72 hours please call [phone].   08/03/22 [place], Tammie Ellsworth B, PA-C 08/03/22 [place], [place], MD 08/05/22 1351

## 2022-08-03 NOTE — Discharge Instructions (Addendum)
You were seen today for chest pain.  Your work-up was reassuring for no signs of acute coronary syndrome at this time.  I have placed a referral for cardiology.  If they do not contact you over the next few days next week I would contact their office for follow-up.  If your chest pain worsens or you develop shortness of breath, or other life-threatening conditions, please return to the emergency department.

## 2022-08-07 LAB — COLOGUARD: COLOGUARD: POSITIVE — AB

## 2022-08-08 ENCOUNTER — Other Ambulatory Visit: Payer: Self-pay | Admitting: Family Medicine

## 2022-08-08 DIAGNOSIS — R195 Other fecal abnormalities: Secondary | ICD-10-CM

## 2022-08-09 ENCOUNTER — Encounter: Payer: Self-pay | Admitting: Gastroenterology

## 2022-09-03 NOTE — Progress Notes (Signed)
Cardiology Office Note:    Date:  09/05/2022   ID:  Derrick West, DOB 1976/04/22, MRN [id number]  PCP:  Billie Ruddy, MD  Cardiologist:  Buford Dresser, MD  Referring MD: Ronny Bacon   CC:  New patient evaluation for chest pain  History of Present Illness:    Derrick West is a 46 y.o. male with a hx of diabetes type 2, cholecystitis, obesity, and depression, who is seen as a new consult at the request of Ronny Bacon for the evaluation and management of chest pain.  He saw Cherlynn June, PA-C on 08/03/2022 when he presented to the ED complaining of substernal chest pain with left forearm pain. His EKG showed NSR at 83 bpm and incomplete RBBB. Initial troponin 4, repeat troponin 3, unremarkable CBC, unremarkable BMP. His symptoms were not suggestive of PE, dissection, or pneumonia. He was discharged in stable condition with cardiology outpatient follow-up.   Chest pain: -Initial onset: Prior to presenting to the ED 08/03/22. While moving around the house he suddenly felt tightness in his chest. -Quality: Very tight in the center of his chest. No radiation of his chest pain. -Frequency: Isolated episode prompting his ED visit. -Duration: Onset was sudden, but took 3-4 hours to gradually ease away. -Aggravating/alleviating factors: No changes with positional movement. 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 He underwent heart surgery when he was 46 yrs old. He spent months at a time in the hospital when he was a child, related to heart issues. No further cardiac issues until his recent chest pain. -Prior ECG: In ER 08/03/22 showed NSR at 83 bpm with incomplete RBBB. -Prior workup:  Initial troponin 4, repeat troponin 3, unremarkable CBC, unremarkable BMP. -Alcohol:  Very rarely, maybe 1-2 beers once a month. -Tobacco: Never. -Comorbidities:  Type 2 diabetes for 15 years, on Metformin. Hyperlipidemia - He has been on  atorvastatin for a long time. Possible hypertension - Typically he takes lisinopril in the evenings. He is not sure what his blood pressure would be off of lisinopril. -Exercise level: Has lost some weight recently. 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 Tries to go to the gym for 30-40 minutes of steady activity such as on the elliptical, maybe light weight lifting. He notes that it is easy for his heart rate to rise to the 150s with exercise, and he usually recovers quickly. -Cardiac ROS: no shortness of breath, no PND, no orthopnea, no LE edema, no syncope -Family history: His mother had 2 TIAs. His paternal grandmother had a CABG.  -Diet:  "Not the best." Since he is working on weight loss he avoids fast food and counts calories. Frequently has hamburgers or chicken, vegetables.   Recently he completed a Cologuard test which returned abnormal; he will have a colonoscopy in January.   Past Medical History:  Diagnosis Date   Anxiety and depression    Cholecystitis    DIABETES MELLITUS, TYPE II 12/24/2007   Fatty liver    GANGLION CYST, WRIST, LEFT 07/29/2007   Headache(784.0)    OBESITY 12/24/2007    Past Surgical History:  Procedure Laterality Date   ANTERIOR CERVICAL DECOMP/DISCECTOMY FUSION N/A 04/15/2013   Procedure: ANTERIOR CERVICAL DECOMPRESSION/DISCECTOMY FUSION 1 LEVEL;  Surgeon: Winfield Cunas, MD;  Location: [place] [facility];  Service: Neurosurgery;  Laterality: N/A;  Cervical Five-Six Anterior Cervical decompression with fusion plating and bonegraft   CHOLECYSTECTOMY N/A 09/05/2017  Procedure: LAPAROSCOPIC CHOLECYSTECTOMY;  Surgeon: Coralie Keens, MD;  Location: [facility];  Service: General;  Laterality: N/A;   ERCP N/A 09/14/2017   Procedure: ENDOSCOPIC RETROGRADE CHOLANGIOPANCREATOGRAPHY (ERCP);  Surgeon: Gatha Mayer, MD;  Location: [facility] END[facility]COPY;  Service: Endoscopy;  Laterality: N/A;   ESOPHAGOGASTRODUODENOSCOPY (EGD) WITH PROPOFOL N/A 12/10/2017   Procedure:  ESOPHAGOGASTRODUODENOSCOPY (EGD) WITH PROPOFOL;  Surgeon: Gatha Mayer, MD;  Location: [facility] ENDOSCOPY;  Service: Endoscopy;  Laterality: N/A;   GASTROINTESTINAL STENT REMOVAL N/A 12/10/2017   Procedure: GASTROINTESTINAL STENT REMOVAL;  Surgeon: Gatha Mayer, MD;  Location: [facility] ENDOSCOPY;  Service: Endoscopy;  Laterality: N/A;   LUMBAR LAMINECTOMY     VSD REPAIR     age 30    Current Medications: Current Outpatient Medications on File Prior to Visit  Medication Sig   atorvastatin (LIPITOR) 10 MG tablet TAKE 1 TABLET(10 MG) BY MOUTH DAILY   glucose blood (ONETOUCH VERIO) test strip Use as instructed   ibuprofen (ADVIL,MOTRIN) 200 MG tablet Take 400 mg by mouth every 6 (six) hours as needed (for pain).   lisinopril (ZESTRIL) 10 MG tablet Take 1 tablet (10 mg total) by mouth daily.   metFORMIN (GLUCOPHAGE) 1000 MG tablet Take 1 tablet (1,000 mg total) by mouth 2 (two) times daily with a meal.   triamcinolone cream (KENALOG) 0.1 % Apply 1 Application topically 2 (two) times daily.   No current facility-administered medications on file prior to visit.     Allergies:   Amoxicillin   Social History   Tobacco Use   Smoking status: Never   Smokeless tobacco: Never  Vaping Use   Vaping Use: Never used  Substance Use Topics   Alcohol use: Yes    Comment: occ   Drug use: No    Family History: family history includes Colon polyps in his father; Diabetes in his mother.  ROS:   Please see the history of present illness.  Additional pertinent ROS: Constitutional: Negative for chills, fever, night sweats, unintentional weight loss  HENT: Negative for ear pain and hearing loss.   Eyes: Negative for loss of vision and eye pain.  Respiratory: Negative for cough, sputum, wheezing.   Cardiovascular: See HPI. Gastrointestinal: Negative for abdominal pain, melena, and hematochezia.  Genitourinary: Negative for dysuria and hematuria.  Musculoskeletal: Negative for falls and myalgias.  Skin:  Negative for itching and rash.  Neurological: Negative for focal weakness, focal sensory changes and loss of consciousness.  Endo/Heme/Allergies: Does not bruise/bleed easily.     EKGs/Labs/Other Studies Reviewed:    The following studies were reviewed today:  Chest X-ray  08/03/2022: FINDINGS: The heart size and mediastinal contours are within normal limits. Both lungs are clear. The visualized skeletal structures are unremarkable.   IMPRESSION: No active cardiopulmonary disease.  EKG:  EKG is personally reviewed.   09/04/2022:  not ordered today 08/03/22: NSR at 83 bpm with incomplete RBBB.  Recent Labs: 07/19/2022: ALT 24; TSH 1.50 08/03/2022: BUN 25; Creatinine, Ser 0.90; Hemoglobin 14.4; Platelets 235; Potassium 4.3; Sodium 141   Recent Lipid Panel    Component Value Date/Time   CHOL 162 07/19/2022 1212   TRIG 70.0 07/19/2022 1212   HDL 40.30 07/19/2022 1212   CHOLHDL 4 07/19/2022 1212   VLDL 14.0 07/19/2022 1212   LDLCALC 107 (H) 07/19/2022 1212   LDLDIRECT 131.0 12/31/2017 1540    Physical Exam:    VS:  BP 114/73 (BP Location: Right Arm, Patient Position: Sitting, Cuff Size: Large)   Pulse 62  Ht '5\' 8"'$  (1.727 m)   Wt 197 lb (89.4 kg)   SpO2 100%   BMI 29.95 kg/m     Wt Readings from Last 3 Encounters:  09/04/22 197 lb (89.4 kg)  08/03/22 191 lb (86.6 kg)  07/19/22 191 lb (86.6 kg)    GEN: Well nourished, well developed in no acute distress HEENT: Normal, moist mucous membranes NECK: No JVD CARDIAC: regular rhythm, normal S1 and S2, no rubs or gallops. No murmur. VASCULAR: Radial and DP pulses 2+ bilaterally. No carotid bruits RESPIRATORY:  Clear to auscultation without rales, wheezing or rhonchi  ABDOMEN: Soft, non-tender, non-distended MUSCULOSKELETAL:  Ambulates independently SKIN: Warm and dry, no edema NEUROLOGIC:  Alert and oriented x 3. No focal neuro deficits noted. PSYCHIATRIC:  Normal affect    ASSESSMENT:    1. Chest pain of  uncertain etiology   2. Essential hypertension   3. Type 2 diabetes mellitus without complication, without long-term current use of insulin ([place])   4. Pure hypercholesterolemia   5. Cardiac risk counseling   6. Counseling on health promotion and disease prevention   7. Precordial pain    PLAN:    Chest pain -reassuring ER evaluation -discussed treadmill stress, nuclear stress/lexiscan, and CT coronary angiography. Discussed pros and cons of each, including but not limited to false positive/false negative risk, radiation risk, and risk of IV contrast dye. Based on shared decision making, decision was made to pursue CT coronary angiography. -HR 62, will hold on metoprolol, can get IV if needed -counseled on use of sublingual nitroglycerin and its importance to a good test  -reviewed red flag warning signs that need immediate medical attention  Hypertension -continue lisinopril but hold AM of CT to allow for BP for nitro  Type II diabetes Hypercholesterolemia -consider SGLT2i if CAD found -continue metformin, hold prior to the CT -continue atorvastatin, increase if CAD noted -had +FOBT, will hold on aspirin for now  Cardiac risk counseling and prevention recommendations: -recommend heart healthy/Mediterranean diet, with whole grains, fruits, vegetable, fish, lean meats, nuts, and olive oil. Limit salt. -recommend moderate walking, 3-5 times/week for 30-50 minutes each session. Aim for at least 150 minutes.week. Goal should be pace of 3 miles/hours, or walking 1.5 miles in 30 minutes -recommend avoidance of tobacco products. Avoid excess alcohol. -ASCVD risk score: The 10-year ASCVD risk score (Arnett DK, et al., 2019) is: 4.7%   Values used to calculate the score:     Age: 27 years     Sex: Male     Is Non-Hispanic African American: No     Diabetic: Yes     Tobacco smoker: No     Systolic Blood Pressure: 706 mmHg     Is BP treated: Yes     HDL Cholesterol: 40.3 mg/dL     Total  Cholesterol: 162 mg/dL    Plan for follow up: 2 years (if testing unremarkable), or sooner as needed.  Buford Dresser, MD, PhD, [facility]    Medication Adjustments/Labs and Tests Ordered: Current medicines are reviewed at length with the patient today.  Concerns regarding medicines are outlined above.   Orders Placed This Encounter  Procedures   CT CORONARY MORPH W/CTA COR W/SCORE W/CA W/CM &/OR WO/CM   Basic metabolic panel   No orders of the defined types were placed in this encounter.  Patient Instructions  Medication Instructions:  Your Physician recommend you continue on your current medication as directed.    *If you  need a refill on your cardiac medications before your next appointment, please call your pharmacy*   Lab Work: Please return for Lab work one week before CT scan for BMP. You may come to the...   [facility] (3rd floor) [address], [place], [place] [address]  Open: 8am-Noon and 1pm-4:30pm  Please ring the doorbell on the small table when you exit the elevator and the Lab Tech will come get you  [place] [facility] [address] [place], [place], [place] [address] Open: 8am-1pm, then 2pm-4:30pm   [place]- Please see attached locations sheet stapled to your lab work with address and hours.   If you have labs (blood work) drawn today and your tests are completely normal, you will receive your results only by: [place] (if you have MyChart) OR A paper copy in the mail If you have any lab test that is abnormal or we need to change your treatment, we will call you to review the results.   Testing/Procedures:   Your cardiac CT will be scheduled at one of the below locations:   [facility] [address] [place], [place] [address] [phone]  If scheduled at [facility], please arrive at the [facility] (Entrance C2) of  [facility] 30 minutes prior to test start time. You can use the FREE valet parking offered at entrance C (encouraged to control the heart rate for the test)  Proceed to the [facility] Radiology Department (first floor) to check-in and test prep.  All radiology patients and guests should use entrance C2 at [facility], accessed from [facility], even though the hospital's physical address listed is [address]    Please follow these instructions carefully (unless otherwise directed):  Hold all erectile dysfunction medications at least 3 days (72 hrs) prior to test. (Ie viagra, cialis, sildenafil, tadalafil, etc) We will administer nitroglycerin during this exam.   On the Night Before the Test: Be sure to Drink plenty of water. Do not consume any caffeinated/decaffeinated beverages or chocolate 12 hours prior to your test. Do not take any antihistamines 12 hours prior to your test.  On the Day of the Test: Drink plenty of water until 1 hour prior to the test. Do not eat any food 1 hour prior to test. HOLD LISINOPRIL AND METFORMIN THE MORNING OF THE TEST       After the Test: Drink plenty of water. After receiving IV contrast, you may experience a mild flushed feeling. This is normal. On occasion, you may experience a mild rash up to 24 hours after the test. This is not dangerous. If this occurs, you can take Benadryl 25 mg and increase your fluid intake. If you experience trouble breathing, this can be serious. If it is severe call 911 IMMEDIATELY. If it is mild, please call our office. If you take any of these medications: Glipizide/Metformin, Avandament, Glucavance, please do not take 48 hours after completing test unless otherwise instructed.  We will call to schedule your test 2-4 weeks out understanding that some insurance companies will need an authorization prior to the service being performed.   For non-scheduling related questions, please  contact the cardiac imaging nurse navigator should you have any questions/concerns: Marchia Bond, Cardiac Imaging Nurse Navigator Gordy Clement, Cardiac Imaging Nurse Navigator [facility] Direct Office Dial: [phone]   For scheduling needs, including cancellations and rescheduling, please call  [place], [phone].  Follow-Up: At [facility], you and your health needs are our priority.  As part of our continuing mission to provide you with exceptional heart care, we have created designated Provider Care Teams.  These Care Teams include your primary Cardiologist (physician) and Advanced Practice Providers (APPs -  Physician Assistants and Nurse Practitioners) who all work together to provide you with the care you need, when you need it.  We recommend signing up for the patient portal called "MyChart".  Sign up information is provided on this After Visit Summary.  MyChart is used to connect with patients for Virtual Visits (Telemedicine).  Patients are able to view lab/test results, encounter notes, upcoming appointments, etc.  Non-urgent messages can be sent to your provider as well.   To learn more about what you can do with MyChart, go to [email or web address].    Your next appointment:   2 year(s)  The format for your next appointment:   In Person  Provider:   Buford Dresser, MD    [facility] [place],acting as a scribe for Buford Dresser, MD.,have documented all relevant documentation on the behalf of Buford Dresser, MD,as directed by  Buford Dresser, MD while in the presence of Buford Dresser, MD.  I, Buford Dresser, MD, have reviewed all documentation for this visit. The documentation on 09/05/22 for the exam, diagnosis, procedures, and orders are all accurate and complete.   Signed, Buford Dresser, MD PhD 09/05/2022 4:15 PM    [facility]

## 2022-09-04 ENCOUNTER — Ambulatory Visit (HOSPITAL_BASED_OUTPATIENT_CLINIC_OR_DEPARTMENT_OTHER): Payer: 59 | Admitting: Cardiology

## 2022-09-04 ENCOUNTER — Encounter (HOSPITAL_BASED_OUTPATIENT_CLINIC_OR_DEPARTMENT_OTHER): Payer: Self-pay | Admitting: Cardiology

## 2022-09-04 VITALS — BP 114/73 | HR 62 | Ht 68.0 in | Wt 197.0 lb

## 2022-09-04 DIAGNOSIS — E119 Type 2 diabetes mellitus without complications: Secondary | ICD-10-CM

## 2022-09-04 DIAGNOSIS — R079 Chest pain, unspecified: Secondary | ICD-10-CM | POA: Diagnosis not present

## 2022-09-04 DIAGNOSIS — R072 Precordial pain: Secondary | ICD-10-CM

## 2022-09-04 DIAGNOSIS — I10 Essential (primary) hypertension: Secondary | ICD-10-CM | POA: Diagnosis not present

## 2022-09-04 DIAGNOSIS — E7800 Pure hypercholesterolemia, unspecified: Secondary | ICD-10-CM

## 2022-09-04 DIAGNOSIS — Z7189 Other specified counseling: Secondary | ICD-10-CM

## 2022-09-04 NOTE — Patient Instructions (Addendum)
Medication Instructions:  Your Physician recommend you continue on your current medication as directed.    *If you need a refill on your cardiac medications before your next appointment, please call your pharmacy*   Lab Work: Please return for Lab work one week before CT scan for BMP. You may come to the...   [facility] (3rd floor) [address] [place], [place] [address]  Open: 8am-Noon and 1pm-4:30pm  Please ring the doorbell on the small table when you exit the elevator and the Lab Tech will come get you  [place] at [facility] [address] [place], [place], [place] [address] Open: 8am-1pm, then 2pm-4:30pm   [place]- Please see attached locations sheet stapled to your lab work with address and hours.   If you have labs (blood work) drawn today and your tests are completely normal, you will receive your results only by: [place] (if you have MyChart) OR A paper copy in the mail If you have any lab test that is abnormal or we need to change your treatment, we will call you to review the results.   Testing/Procedures:   Your cardiac CT will be scheduled at one of the below locations:   [facility] [address] [place], [place] [address] [phone]  If scheduled at [facility], please arrive at the [facility] and Children's Entrance (Entrance C2) of [facility] 30 minutes prior to test start time. You can use the FREE valet parking offered at entrance C (encouraged to control the heart rate for the test)  Proceed to the [facility] Radiology Department (first floor) to check-in and test prep.  All radiology patients and guests should use entrance C2 at [facility], accessed from [facility], even though the hospital's physical address listed is [address].    Please follow these instructions carefully (unless otherwise directed):  Hold all erectile  dysfunction medications at least 3 days (72 hrs) prior to test. (Ie viagra, cialis, sildenafil, tadalafil, etc) We will administer nitroglycerin during this exam.   On the Night Before the Test: Be sure to Drink plenty of water. Do not consume any caffeinated/decaffeinated beverages or chocolate 12 hours prior to your test. Do not take any antihistamines 12 hours prior to your test.  On the Day of the Test: Drink plenty of water until 1 hour prior to the test. Do not eat any food 1 hour prior to test. HOLD LISINOPRIL AND METFORMIN THE MORNING OF THE TEST       After the Test: Drink plenty of water. After receiving IV contrast, you may experience a mild flushed feeling. This is normal. On occasion, you may experience a mild rash up to 24 hours after the test. This is not dangerous. If this occurs, you can take Benadryl 25 mg and increase your fluid intake. If you experience trouble breathing, this can be serious. If it is severe call 911 IMMEDIATELY. If it is mild, please call our office. If you take any of these medications: Glipizide/Metformin, Avandament, Glucavance, please do not take 48 hours after completing test unless otherwise instructed.  We will call to schedule your test 2-4 weeks out understanding that some insurance companies will need an authorization prior to the service being performed.   For non-scheduling related questions, please contact the cardiac imaging nurse navigator should you have any questions/concerns: Marchia Bond, Cardiac Imaging Nurse Navigator Gordy Clement, Cardiac Imaging Nurse Navigator [facility] Direct Office Dial: [phone]   For scheduling needs, including cancellations and rescheduling, please call [place], [phone].  Follow-Up: At [facility], you and your health needs are our priority.  As part of our continuing mission to provide you with exceptional heart care, we have created designated Provider  Care Teams.  These Care Teams include your primary Cardiologist (physician) and Advanced Practice Providers (APPs -  Physician Assistants and Nurse Practitioners) who all work together to provide you with the care you need, when you need it.  We recommend signing up for the patient portal called "MyChart".  Sign up information is provided on this After Visit Summary.  MyChart is used to connect with patients for Virtual Visits (Telemedicine).  Patients are able to view lab/test results, encounter notes, upcoming appointments, etc.  Non-urgent messages can be sent to your provider as well.   To learn more about what you can do with MyChart, go to [email or web address].    Your next appointment:   2 year(s)  The format for your next appointment:   In Person  Provider:   Buford Dresser, MD

## 2022-09-06 ENCOUNTER — Ambulatory Visit (AMBULATORY_SURGERY_CENTER): Payer: 59

## 2022-09-06 VITALS — Ht 68.0 in | Wt 193.0 lb

## 2022-09-06 DIAGNOSIS — R195 Other fecal abnormalities: Secondary | ICD-10-CM

## 2022-09-06 MED ORDER — NA SULFATE-K SULFATE-MG SULF 17.5-3.13-1.6 GM/177ML PO SOLN: 1.0000 | Freq: Once | ORAL | 0 refills | Status: AC

## 2022-09-06 NOTE — Progress Notes (Signed)

## 2022-09-14 ENCOUNTER — Telehealth (HOSPITAL_COMMUNITY): Payer: Self-pay | Admitting: *Deleted

## 2022-09-14 NOTE — Telephone Encounter (Signed)
Patient returning call regarding upcoming cardiac imaging study; pt verbalizes understanding of appt date/time, parking situation and where to check in, pre-test NPO status, and verified current allergies; name and call back number provided for further questions should they arise  Derrick Clement RN Navigator Cardiac Imaging [facility] [phone] office [phone] cell  Patient to obtain labs prior to his cardiac CT scan and hold his lisinopril x 2 days prior to appt. He is aware to arrive at 11am.

## 2022-09-14 NOTE — Telephone Encounter (Signed)
Attempted to call patient regarding upcoming cardiac CT appointment and reminder for labs prior to appt. Left message on voicemail with name and callback number  Gordy Clement RN Navigator Cardiac [facility] [phone] Office [phone] Cell

## 2022-09-18 LAB — BASIC METABOLIC PANEL
BUN/Creatinine Ratio: 13 (ref 9–20)
BUN: 10 mg/dL (ref 6–24)
CO2: 25 mmol/L (ref 20–29)
Calcium: 8.9 mg/dL (ref 8.7–10.2)
Chloride: 103 mmol/L (ref 96–106)
Creatinine, Ser: 0.75 mg/dL — ABNORMAL LOW (ref 0.76–1.27)
Glucose: 125 mg/dL — ABNORMAL HIGH (ref 70–99)
Potassium: 4.7 mmol/L (ref 3.5–5.2)
Sodium: 140 mmol/L (ref 134–144)
eGFR: 111 mL/min/{1.73_m2} (ref >59)

## 2022-09-19 ENCOUNTER — Ambulatory Visit (HOSPITAL_COMMUNITY)
Admission: RE | Admit: 2022-09-19 | Discharge: 2022-09-19 | Disposition: A | Discharge status: Home / Self Care | Payer: 59 | Source: Ambulatory Visit | Attending: Cardiology | Admitting: Cardiology

## 2022-09-19 DIAGNOSIS — R072 Precordial pain: Secondary | ICD-10-CM | POA: Diagnosis present

## 2022-09-19 MED ORDER — NITROGLYCERIN 0.4 MG SL SUBL
SUBLINGUAL_TABLET | SUBLINGUAL | Status: AC
  Filled 2022-09-19: qty 2

## 2022-09-19 MED ORDER — IOHEXOL 350 MG/ML SOLN
100.0000 mL | Freq: Once | INTRAVENOUS | Status: AC | PRN
  Administered 2022-09-19: 100 mL via INTRAVENOUS

## 2022-09-19 MED ORDER — NITROGLYCERIN 0.4 MG SL SUBL
0.8000 mg | SUBLINGUAL_TABLET | Freq: Once | SUBLINGUAL | Status: AC
  Administered 2022-09-19: 0.8 mg via SUBLINGUAL

## 2022-09-25 ENCOUNTER — Telehealth (HOSPITAL_BASED_OUTPATIENT_CLINIC_OR_DEPARTMENT_OTHER): Payer: Self-pay

## 2022-09-25 DIAGNOSIS — E782 Mixed hyperlipidemia: Secondary | ICD-10-CM

## 2022-09-25 MED ORDER — ATORVASTATIN CALCIUM 20 MG PO TABS: ORAL_TABLET | ORAL | 3 refills | Status: DC

## 2022-09-25 MED ORDER — ASPIRIN 81 MG PO TBEC: 81.0000 mg | DELAYED_RELEASE_TABLET | Freq: Every day | ORAL | 3 refills | Status: AC

## 2022-09-25 NOTE — Telephone Encounter (Addendum)
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 Not enough to cause pain but focus on secondary prevention. Start Aspirin EC '81mg'$  daily. Goal for LDL (bad cholesterol) to be less than 70. Increase Atorvastatin to '20mg'$  daily with repeat fasting lipid panel, CMP in 3 months. Follow up with Dr. Harrell Gave in 6 months.

## 2022-10-03 ENCOUNTER — Encounter: Payer: Self-pay | Admitting: Gastroenterology

## 2022-10-05 ENCOUNTER — Ambulatory Visit (AMBULATORY_SURGERY_CENTER): Payer: 59 | Admitting: Gastroenterology

## 2022-10-05 ENCOUNTER — Encounter: Payer: Self-pay | Admitting: Gastroenterology

## 2022-10-05 VITALS — BP 116/56 | HR 54 | Temp 96.8°F | Resp 15 | Ht 68.0 in | Wt 193.0 lb

## 2022-10-05 DIAGNOSIS — D125 Benign neoplasm of sigmoid colon: Secondary | ICD-10-CM

## 2022-10-05 DIAGNOSIS — R195 Other fecal abnormalities: Secondary | ICD-10-CM

## 2022-10-05 DIAGNOSIS — K5730 Diverticulosis of large intestine without perforation or abscess without bleeding: Secondary | ICD-10-CM

## 2022-10-05 DIAGNOSIS — K635 Polyp of colon: Secondary | ICD-10-CM

## 2022-10-05 DIAGNOSIS — D123 Benign neoplasm of transverse colon: Secondary | ICD-10-CM

## 2022-10-05 MED ORDER — SODIUM CHLORIDE 0.9 % IV SOLN: 500.0000 mL | Freq: Once | INTRAVENOUS | Status: DC

## 2022-10-05 NOTE — Progress Notes (Signed)
Sedate, gd SR, tolerated procedure well, VSS, report to RN 

## 2022-10-05 NOTE — Patient Instructions (Addendum)
Recommendation: Patient has a contact number available for                            emergencies. The signs and symptoms of potential                            delayed complications were discussed with the                            patient. Return to normal activities tomorrow.                            Written discharge instructions were provided to the                            patient.                           - Resume previous diet.                           - Continue present medications.                           - Await pathology results.                           - Repeat colonoscopy (date not yet determined) for                            surveillance based on pathology results.  Handouts on polyps and diverticulosis given.  YOU HAD AN ENDOSCOPIC PROCEDURE TODAY AT [facility]:   Refer to the procedure report that was given to you for any specific questions about what was found during the examination.  If the procedure report does not answer your questions, please call your gastroenterologist to clarify.  If you requested that your care partner not be given the details of your procedure findings, then the procedure report has been included in a sealed envelope for you to review at your convenience later.  YOU SHOULD EXPECT: Some feelings of bloating in the abdomen. Passage of more gas than usual.  Walking can help get rid of the air that was put into your GI tract during the procedure and reduce the bloating. If you had a lower endoscopy (such as a colonoscopy or flexible sigmoidoscopy) you may notice spotting of blood in your stool or on the toilet paper. If you underwent a bowel prep for your procedure, you may not have a normal bowel movement for a few days.  Please Note:  You might notice some irritation and congestion in your nose or some drainage.  This is from the oxygen used during your procedure.  There is no need for concern and it should clear up in a day  or so.  SYMPTOMS TO REPORT IMMEDIATELY:  Following lower endoscopy (colonoscopy or flexible sigmoidoscopy):  Excessive amounts of blood in the stool  Significant tenderness or worsening of abdominal pains  Swelling of the abdomen that is new, acute  Fever of 100F or higher  For urgent  or emergent issues, a gastroenterologist can be reached at any hour by calling [phone]. Do not use MyChart messaging for urgent concerns.   DIET:  We do recommend a small meal at first, but then you may proceed to your regular diet.  Drink plenty of fluids but you should avoid alcoholic beverages for 24 hours.  ACTIVITY:  You should plan to take it easy for the rest of today and you should NOT DRIVE or use heavy machinery until tomorrow (because of the sedation medicines used during the test).    FOLLOW UP: Our staff will call the number listed on your records the next business day following your procedure.  We will call around 7:15- 8:00 am to check on you and address any questions or concerns that you may have regarding the information given to you following your procedure. If we do not reach you, we will leave a message.     If any biopsies were taken you will be contacted by phone or by letter within the next 1-3 weeks.  Please call us at [phone] if you have not heard about the biopsies in 3 weeks.   SIGNATURES/CONFIDENTIALITY: You and/or your care partner have signed paperwork which will be entered into your electronic medical record.  These signatures attest to the fact that that the information above on your After Visit Summary has been reviewed and is understood.  Full responsibility of the confidentiality of this discharge information lies with you and/or your care-partner.

## 2022-10-05 NOTE — Progress Notes (Signed)
Called to room to assist during endoscopic procedure.  Patient ID and intended procedure confirmed with present staff. Received instructions for my participation in the procedure from the performing physician.

## 2022-10-05 NOTE — Progress Notes (Signed)
Pt's states no medical or surgical changes since previsit or office visit. 

## 2022-10-05 NOTE — Op Note (Signed)
[place] Patient Name: Derrick West Procedure Date: 10/05/2022 12:43 PM MRN: [id number] Endoscopist: [place]. Candis Schatz , MD, [id number] Age: 47 Referring MD:  Date of Birth: 07-01-1976 Gender: Male Account #: [id number] Procedure:                Colonoscopy Indications:              Positive Cologuard test Medicines:                Monitored Anesthesia Care Procedure:                Pre-Anesthesia Assessment:                           - Prior to the procedure, a History and Physical                            was performed, and patient medications and                            allergies were reviewed. The patient's tolerance of                            previous anesthesia was also reviewed. The risks                            and benefits of the procedure and the sedation                            options and risks were discussed with the patient.                            All questions were answered, and informed consent                            was obtained. Prior Anticoagulants: The patient has                            taken no anticoagulant or antiplatelet agents. ASA                            Grade Assessment: II - A patient with mild systemic                            disease. After reviewing the risks and benefits,                            the patient was deemed in satisfactory condition to                            undergo the procedure.                           After obtaining informed consent, the colonoscope  was passed under direct vision. Throughout the                            procedure, the patient's blood pressure, pulse, and                            oxygen saturations were monitored continuously. The                            CF [id number] #[id number] was introduced through the anus                            and advanced to the the terminal ileum, with                            identification of the appendiceal  orifice and IC                            valve. The colonoscopy was performed without                            difficulty. The patient tolerated the procedure                            well. The quality of the bowel preparation was                            good. The terminal ileum, ileocecal valve,                            appendiceal orifice, and rectum were photographed.                            The bowel preparation used was SUPREP via split                            dose instruction. Scope In: 47:82:95 PM Scope Out: 1:01:37 PM Scope Withdrawal Time: 0 hours 11 minutes 12 seconds  Total Procedure Duration: 0 hours 14 minutes 25 seconds  Findings:                 The perianal and digital rectal examinations were                            normal. Pertinent negatives include normal                            sphincter tone and no palpable rectal lesions.                           A 7 mm polyp was found in the transverse colon. The                            polyp was flat. The polyp was removed with  a cold                            snare. Resection and retrieval were complete.                            Estimated blood loss was minimal.                           A 3 mm polyp was found in the sigmoid colon. The                            polyp was sessile. The polyp was removed with a                            cold snare. Resection and retrieval were complete.                            Estimated blood loss was minimal.                           A few small-mouthed diverticula were found in the                            ascending colon.                           The exam was otherwise normal throughout the                            examined colon.                           The terminal ileum appeared normal.                           The retroflexed view of the distal rectum and anal                            verge was normal and showed no anal or rectal                             abnormalities. Complications:            No immediate complications. Estimated Blood Loss:     Estimated blood loss was minimal. Impression:               - One 7 mm polyp in the transverse colon, removed                            with a cold snare. Resected and retrieved.                           - One 3 mm polyp in the sigmoid colon, removed with  a cold snare. Resected and retrieved.                           - Diverticulosis in the ascending colon.                           - The examined portion of the ileum was normal.                           - The distal rectum and anal verge are normal on                            retroflexion view. Recommendation:           - Patient has a contact number available for                            emergencies. The signs and symptoms of potential                            delayed complications were discussed with the                            patient. Return to normal activities tomorrow.                            Written discharge instructions were provided to the                            patient.                           - Resume previous diet.                           - Continue present medications.                           - Await pathology results.                           - Repeat colonoscopy (date not yet determined) for                            surveillance based on pathology results. Margaux Engen E. Candis Schatz, MD 10/05/2022 1:08:08 PM This report has been signed electronically.

## 2022-10-05 NOTE — Progress Notes (Signed)
[facility] History and Physical   Primary Care Physician:  Billie Ruddy, MD   Reason for Procedure:   Positive Cologuard  Plan:    Colonoscopy     HPI: Derrick West is a 47 y.o. male undergoing colonoscopy following positive Cologuard test.  He has no family history of colon cancer and no chronic GI symptoms.    Past Medical History:  Diagnosis Date   Anxiety and depression    Cholecystitis    DIABETES MELLITUS, TYPE II 12/24/2007   Fatty liver    GANGLION CYST, WRIST, LEFT 07/29/2007   Headache(784.0)    Hypertension    OBESITY 12/24/2007    Past Surgical History:  Procedure Laterality Date   ANTERIOR CERVICAL DECOMP/DISCECTOMY FUSION N/A 04/15/2013   Procedure: ANTERIOR CERVICAL DECOMPRESSION/DISCECTOMY FUSION 1 LEVEL;  Surgeon: Winfield Cunas, MD;  Location: [facility];  Service: Neurosurgery;  Laterality: N/A;  Cervical Five-Six Anterior Cervical decompression with fusion plating and bonegraft   CHOLECYSTECTOMY N/A 09/05/2017   Procedure: LAPAROSCOPIC CHOLECYSTECTOMY;  Surgeon: Coralie Keens, MD;  Location: [facility];  Service: General;  Laterality: N/A;   ERCP N/A 09/14/2017   Procedure: ENDOSCOPIC RETROGRADE CHOLANGIOPANCREATOGRAPHY (ERCP);  Surgeon: Gatha Mayer, MD;  Location: [facility];  Service: Endoscopy;  Laterality: N/A;   ESOPHAGOGASTRODUODENOSCOPY (EGD) WITH PROPOFOL N/A 12/10/2017   Procedure: ESOPHAGOGASTRODUODENOSCOPY (EGD) WITH PROPOFOL;  Surgeon: Gatha Mayer, MD;  Location: [facility];  Service: Endoscopy;  Laterality: N/A;   GASTROINTESTINAL STENT REMOVAL N/A 12/10/2017   Procedure: GASTROINTESTINAL STENT REMOVAL;  Surgeon: Gatha Mayer, MD;  Location: [facility];  Service: Endoscopy;  Laterality: N/A;   LUMBAR LAMINECTOMY     VSD REPAIR     age 89    Prior to Admission medications   Medication Sig Start Date End Date Taking? Authorizing Provider  aspirin EC 81 MG tablet Take 1 tablet (81 mg total) by mouth daily.  Swallow whole. 09/25/22  Yes Loel Dubonnet, NP  atorvastatin (LIPITOR) 20 MG tablet TAKE 1 TABLET(10 MG) BY MOUTH DAILY 09/25/22  Yes Loel Dubonnet, NP  glucose blood (ONETOUCH VERIO) test strip Use as instructed 07/19/22  Yes Billie Ruddy, MD  ibuprofen (ADVIL,MOTRIN) 200 MG tablet Take 400 mg by mouth every 6 (six) hours as needed (for pain).   Yes [provider]  lisinopril (ZESTRIL) 10 MG tablet Take 1 tablet (10 mg total) by mouth daily. 07/19/22  Yes Billie Ruddy, MD  metFORMIN (GLUCOPHAGE) 1000 MG tablet Take 1 tablet (1,000 mg total) by mouth 2 (two) times daily with a meal. 07/19/22  Yes Billie Ruddy, MD  triamcinolone cream (KENALOG) 0.1 % Apply 1 Application topically 2 (two) times daily. 07/19/22   Billie Ruddy, MD    Current Outpatient Medications  Medication Sig Dispense Refill   aspirin EC 81 MG tablet Take 1 tablet (81 mg total) by mouth daily. Swallow whole. 90 tablet 3   atorvastatin (LIPITOR) 20 MG tablet TAKE 1 TABLET(10 MG) BY MOUTH DAILY 90 tablet 3   glucose blood (ONETOUCH VERIO) test strip Use as instructed 100 strip 1   ibuprofen (ADVIL,MOTRIN) 200 MG tablet Take 400 mg by mouth every 6 (six) hours as needed (for pain).     lisinopril (ZESTRIL) 10 MG tablet Take 1 tablet (10 mg total) by mouth daily. 90 tablet 3   metFORMIN (GLUCOPHAGE) 1000 MG tablet Take 1 tablet (1,000 mg total) by mouth 2 (two) times daily with a meal. 180  tablet 3   triamcinolone cream (KENALOG) 0.1 % Apply 1 Application topically 2 (two) times daily. 60 g 0   Current Facility-Administered Medications  Medication Dose Route Frequency Provider Last Rate Last Admin   0.9 %  sodium chloride infusion  500 mL Intravenous Once Daryel November, MD        Allergies as of 10/05/2022 - Review Complete 10/05/2022  Allergen Reaction Noted   Amoxicillin Other (See Comments)     Family History  Problem Relation Age of Onset   Diabetes Mother    Colon polyps Father     Colon cancer Neg Hx    Esophageal cancer Neg Hx    Prostate cancer Neg Hx    Rectal cancer Neg Hx     Social History   Socioeconomic History   Marital status: Widowed    Spouse name: Not on file   Number of children: 1   Years of education: Not on file   Highest education level: 12th grade  Occupational History   Occupation: tech (shift leader)  Tobacco Use   Smoking status: Never   Smokeless tobacco: Never  Vaping Use   Vaping Use: Never used  Substance and Sexual Activity   Alcohol use: Yes    Comment: occ   Drug use: No   Sexual activity: Not on file  Other Topics Concern   Not on file  Social History Narrative   Not on file   Social Determinants of Health   Financial Resource Strain: Medium Risk (08/27/2021)   Overall Financial Resource Strain (CARDIA)    Difficulty of Paying Living Expenses: Somewhat hard  Food Insecurity: Unknown (08/27/2021)   Hunger Vital Sign    Worried About Running Out of Food in the Last Year: Never true    Ran Out of Food in the Last Year: Patient refused  Transportation Needs: No Transportation Needs (08/27/2021)   PRAPARE - Hydrologist (Medical): No    Lack of Transportation (Non-Medical): No  Physical Activity: Insufficiently Active (08/27/2021)   Exercise Vital Sign    Days of Exercise per Week: 3 days    Minutes of Exercise per Session: 30 min  Stress: Stress Concern Present (08/27/2021)   [place]    Feeling of Stress : Very much  Social Connections: Unknown (08/27/2021)   Social Connection and Isolation Panel [NHANES]    Frequency of Communication with Friends and Family: Once a week    Frequency of Social Gatherings with Friends and Family: Patient refused    Attends Religious Services: Never    Marine scientist or Organizations: Patient refused    Attends Archivist Meetings: Not on file    Marital Status: Widowed   Intimate Partner Violence: Not on file    Review of Systems:  All other review of systems negative except as mentioned in the HPI.  Physical Exam: Vital signs BP (!) 105/56   Pulse 62   Temp (!) 96.8 F (36 C)   Ht '5\' 8"'$  (1.727 m)   Wt 193 lb (87.5 kg)   SpO2 100%   BMI 29.35 kg/m   General:   Alert,  Well-developed, well-nourished, pleasant and cooperative in NAD Airway:  Mallampati 2 Lungs:  Clear throughout to auscultation.   Heart:  Regular rate and rhythm; no murmurs, clicks, rubs,  or gallops. Abdomen:  Soft, nontender and nondistended. Normal bowel sounds.   Neuro/Psych:  Normal mood  and affect. A and O x 3   Katja Blue E. Candis Schatz, MD [facility]

## 2022-10-08 ENCOUNTER — Telehealth: Payer: Self-pay

## 2022-10-08 NOTE — Telephone Encounter (Signed)
Follow up call placed, VM obtained and message left. 

## 2022-10-23 NOTE — Progress Notes (Signed)
Derrick West,  Both polyps resected were reported as being hyperplastic polyps, which are not considered precancerous.  However, based on the appearance and location of the larger polyp, it is more likely that it is a sessile serrated polyp, which can look very similar histologically to hyperplastic polyps.  Sessile serrated polyps are considered precancerous.  In general, it is recommended to treat hyperplastic polyps in the right colon as sessile serrated polyps.  Therefore, I recommend that you undergo repeat colonoscopy in 7 years.  Please reach out to our office if you have any questions.

## 2022-11-19 ENCOUNTER — Ambulatory Visit: Payer: 59 | Admitting: Family Medicine

## 2022-11-19 VITALS — BP 130/80 | HR 80 | Temp 98.6°F | Ht 68.0 in | Wt 212.4 lb

## 2022-11-19 DIAGNOSIS — F411 Generalized anxiety disorder: Secondary | ICD-10-CM

## 2022-11-19 DIAGNOSIS — E118 Type 2 diabetes mellitus with unspecified complications: Secondary | ICD-10-CM | POA: Diagnosis not present

## 2022-11-19 DIAGNOSIS — F321 Major depressive disorder, single episode, moderate: Secondary | ICD-10-CM

## 2022-11-19 DIAGNOSIS — M778 Other enthesopathies, not elsewhere classified: Secondary | ICD-10-CM

## 2022-11-19 DIAGNOSIS — E6609 Other obesity due to excess calories: Secondary | ICD-10-CM

## 2022-11-19 DIAGNOSIS — Z6832 Body mass index (BMI) 32.0-32.9, adult: Secondary | ICD-10-CM

## 2022-11-19 LAB — POCT GLYCOSYLATED HEMOGLOBIN (HGB A1C): Hemoglobin A1C: 6.7 % — AB (ref 4.0–5.6)

## 2022-11-19 MED ORDER — PHENTERMINE HCL 15 MG PO CAPS: 15.0000 mg | ORAL_CAPSULE | Freq: Every day | ORAL | 2 refills | Status: DC

## 2022-11-19 NOTE — Progress Notes (Signed)
Established Patient Office Visit   Subjective  Patient ID: Derrick West, male    DOB: [birth year]  Age: 47 y.o. MRN: [id number]  No chief complaint on file.   Pt is a 47 yo male with pmh sig for HTN, DM II, fatty liver, depression, anxiety seen for f/u.  Pt states he is doing ok.  Still has some down days from the loss of his wife.  Gaining weight.  Trying to eat better and exercise, but it is difficult with work schedule and with his daughter's school and sports schedule. Inquires about medication to help with weight.  Pt endorses b/l elbow pain, worse with certain movements, x months.  Pt had colonoscopy 10/05/22 after Cologuard testing was positive.  Family medical hx: Dad-BPH PGF-prostate cancer.    Past Medical History:  Diagnosis Date   Anxiety and depression    Cholecystitis    DIABETES MELLITUS, TYPE II 12/24/2007   Fatty liver    GANGLION CYST, WRIST, LEFT 07/29/2007   Headache(784.0)    Hypertension    OBESITY 12/24/2007   Past Surgical History:  Procedure Laterality Date   ANTERIOR CERVICAL DECOMP/DISCECTOMY FUSION N/A 04/15/2013   Procedure: ANTERIOR CERVICAL DECOMPRESSION/DISCECTOMY FUSION 1 LEVEL;  Surgeon: Winfield Cunas, MD;  Location: [place] [facility];  Service: Neurosurgery;  Laterality: N/A;  Cervical Five-Six Anterior Cervical decompression with fusion plating and bonegraft   CHOLECYSTECTOMY N/A 09/05/2017   Procedure: LAPAROSCOPIC CHOLECYSTECTOMY;  Surgeon: Coralie Keens, MD;  Location: [place];  Service: General;  Laterality: N/A;   ERCP N/A 09/14/2017   Procedure: ENDOSCOPIC RETROGRADE CHOLANGIOPANCREATOGRAPHY (ERCP);  Surgeon: Gatha Mayer, MD;  Location: [facility] END[facility]COPY;  Service: Endoscopy;  Laterality: N/A;   ESOPHAGOGASTRODUODENOSCOPY (EGD) WITH PROPOFOL N/A 12/10/2017   Procedure: ESOPHAGOGASTRODUODENOSCOPY (EGD) WITH PROPOFOL;  Surgeon: Gatha Mayer, MD;  Location: [facility];  Service: Endoscopy;  Laterality: N/A;   GASTROINTESTINAL STENT REMOVAL  N/A 12/10/2017   Procedure: GASTROINTESTINAL STENT REMOVAL;  Surgeon: Gatha Mayer, MD;  Location: [facility];  Service: Endoscopy;  Laterality: N/A;   LUMBAR LAMINECTOMY     VSD REPAIR     age 73   Social History   Tobacco Use   Smoking status: Never   Smokeless tobacco: Never  Vaping Use   Vaping Use: Never used  Substance Use Topics   Alcohol use: Yes    Comment: occ   Drug use: No   Family History  Problem Relation Age of Onset   Diabetes Mother    Colon polyps Father    Colon cancer Neg Hx    Esophageal cancer Neg Hx    Prostate cancer Neg Hx    Rectal cancer Neg Hx    Allergies  Allergen Reactions   Amoxicillin Other (See Comments)    From childhood; reaction not recalled      ROS Negative unless stated above    Objective:     BP 130/80 (BP Location: Right Arm, Patient Position: Sitting, Cuff Size: Normal)   Pulse 80   Temp 98.6 F (37 C) (Oral)   Ht 5\' 8"  (1.727 m)   Wt 212 lb 6.4 oz (96.3 kg)   SpO2 98%   BMI 32.30 kg/m  BP Readings from Last 3 Encounters:  11/19/22 130/80  10/05/22 (!) 116/56  09/19/22 129/76   Wt Readings from Last 3 Encounters:  11/19/22 212 lb 6.4 oz (96.3 kg)  10/05/22 193 lb (87.5 kg)  09/06/22 193 lb (87.5 kg)  Physical Exam Constitutional:      General: He is not in acute distress.    Appearance: Normal appearance.  HENT:     Head: Normocephalic and atraumatic.     Nose: Nose normal.     Mouth/Throat:     Mouth: Mucous membranes are moist.  Cardiovascular:     Rate and Rhythm: Normal rate and regular rhythm.     Heart sounds: Normal heart sounds. No murmur heard.    No gallop.  Pulmonary:     Effort: Pulmonary effort is normal. No respiratory distress.     Breath sounds: Normal breath sounds. No wheezing, rhonchi or rales.  Musculoskeletal:     Right elbow: Tenderness present in lateral epicondyle.     Left elbow: Tenderness present in lateral epicondyle.  Skin:    General: Skin is warm and dry.   Neurological:     Mental Status: He is alert and oriented to person, place, and time.      Results for orders placed or performed in visit on 11/19/22  POC HgB A1c  Result Value Ref Range   Hemoglobin A1C 6.7 (A) 4.0 - 5.6 %   HbA1c POC (<> result, manual entry)     HbA1c, POC (prediabetic range)     HbA1c, POC (controlled diabetic range)     Wt Readings from Last 3 Encounters:  11/19/22 212 lb 6.4 oz (96.3 kg)  10/05/22 193 lb (87.5 kg)  09/06/22 193 lb (87.5 kg)       11/19/2022    1:32 PM 07/19/2022   11:33 AM 11/07/2020    1:55 PM  Depression screen PHQ 2/9  Decreased Interest 2 1 2   Down, Depressed, Hopeless 1 1 1   PHQ - 2 Score 3 2 3   Altered sleeping 3 3 2   Tired, decreased energy 2 2 2   Change in appetite 3 0 3  Feeling bad or failure about yourself  2 1 2   Trouble concentrating 2 3 1   Moving slowly or fidgety/restless 2 0 1  Suicidal thoughts 0 0 0  PHQ-9 Score 17 11 14   Difficult doing work/chores Somewhat difficult Somewhat difficult Somewhat difficult   Diabetic Foot Exam - Simple   Simple Foot Form  11/19/2022  1:51 PM  Visual Inspection No deformities, no ulcerations, no other skin breakdown bilaterally: Yes Sensation Testing Intact to touch and monofilament testing bilaterally: Yes Pulse Check Posterior Tibialis and Dorsalis pulse intact bilaterally: Yes Comments No deformities noted.  Vibratory sense intact.  Sensation to monofilament intact.        Assessment & Plan:  Controlled type 2 diabetes mellitus with complication, without long-term current use of insulin (HCC) -controlled -Hemoglobin A1c 6.7% -Continue metformin 1000 mg twice daily -Continue ACE-I and ARB -Patient encouraged to schedule eye exam. -     POCT glycosylated hemoglobin (Hb A1C)  Bilateral elbow tendonitis -continue supportive care with OTC medications, heat, ice, modifying activities, support.   --given continued sx sports med/ortho eval advised -     Ambulatory  referral to Orthopedic Surgery  Class 1 obesity due to excess calories with serious comorbidity and body mass index (BMI) of 32.0 to 32.9 in adult -Body mass index is 32.3 kg/m. -wt 212 lbs -continue lifestyle modifications -discussed r/b/a of various wt loss medication options. -will start phentermine. -consider wt management referral -     Phentermine HCl; Take 1 capsule (15 mg total) by mouth daily before breakfast.  Dispense: 30 capsule; Refill: 2  GAD (generalized anxiety  disorder) -GAD 7  -previously on wellbutrin and zoloft -consider restarting for increased symptoms. -counseling -continue to monitor  Depression, major, single episode, moderate (HCC) -PHQ 9 score 17 this visit -counseling -consider restarting medication.  Previously on wellbutrin and zoloft.  Zoloft made pt short tempered. -monitor closely   Return in about 3 months (around 02/17/2023) for weight, diabetes, and labs.   Billie Ruddy, MD

## 2023-02-15 ENCOUNTER — Ambulatory Visit: Payer: 59 | Admitting: Family Medicine

## 2023-02-15 ENCOUNTER — Encounter: Payer: Self-pay | Admitting: Family Medicine

## 2023-02-15 VITALS — BP 126/70 | HR 73 | Temp 98.2°F | Wt 209.0 lb

## 2023-02-15 DIAGNOSIS — I10 Essential (primary) hypertension: Secondary | ICD-10-CM | POA: Diagnosis not present

## 2023-02-15 DIAGNOSIS — E669 Obesity, unspecified: Secondary | ICD-10-CM

## 2023-02-15 DIAGNOSIS — Z7984 Long term (current) use of oral hypoglycemic drugs: Secondary | ICD-10-CM

## 2023-02-15 DIAGNOSIS — E118 Type 2 diabetes mellitus with unspecified complications: Secondary | ICD-10-CM

## 2023-02-15 DIAGNOSIS — Z6831 Body mass index (BMI) 31.0-31.9, adult: Secondary | ICD-10-CM

## 2023-02-15 MED ORDER — PHENTERMINE HCL 30 MG PO CAPS: ORAL_CAPSULE | ORAL | 3 refills | Status: DC

## 2023-02-15 NOTE — Progress Notes (Signed)
Established Patient Office Visit   Subjective  Patient ID: Derrick West, male    DOB: 08-Feb-1976  Age: 47 y.o. MRN: [id number]  Chief Complaint  Patient presents with   Medical Management of Chronic Issues    DM    Patient is a 47 year old male seen for follow-up.  Patient states blood sugar has been doing well.  No high readings.  Needs to schedule eye exam.  Taking phentermine 15 mg for weight.  Has noticed medication suppresses his appetite throughout the day but by 7 PM he feels ravenous.  Patient denies increased insomnia from medication.  Exercising at the gym and doing cardio 3-4 times per week.  Lost 3 pounds.  Often eating dinner late due to his daughter's schedule as she is active with cheerleading and softball.  Patient had a blister on the bottom of his foot after increased walking.      ROS Negative unless stated above    Objective:     BP 126/70 (BP Location: Right Arm, Patient Position: Sitting, Cuff Size: Large)   Pulse 73   Temp 98.2 F (36.8 C) (Oral)   Wt 209 lb (94.8 kg)   SpO2 97%   BMI 31.78 kg/m  BP Readings from Last 3 Encounters:  02/15/23 126/70  11/19/22 130/80  10/05/22 (!) 116/56   Wt Readings from Last 3 Encounters:  02/15/23 209 lb (94.8 kg)  11/19/22 212 lb 6.4 oz (96.3 kg)  10/05/22 193 lb (87.5 kg)      Physical Exam Constitutional:      General: He is not in acute distress.    Appearance: Normal appearance.  HENT:     Head: Normocephalic and atraumatic.     Nose: Nose normal.     Mouth/Throat:     Mouth: Mucous membranes are moist.  Cardiovascular:     Rate and Rhythm: Normal rate and regular rhythm.     Heart sounds: Normal heart sounds. No murmur heard.    No gallop.  Pulmonary:     Effort: Pulmonary effort is normal. No respiratory distress.     Breath sounds: Normal breath sounds. No wheezing, rhonchi or rales.  Skin:    General: Skin is warm and dry.  Neurological:     Mental Status: He is alert and oriented to  person, place, and time.     Diabetic Foot Exam - Simple   Simple Foot Form Diabetic Foot exam was performed with the following findings: Yes 02/15/2023  9:06 AM  Visual Inspection No deformities, no ulcerations, no other skin breakdown bilaterally: Yes See comments: Yes Sensation Testing Intact to touch and monofilament testing bilaterally: Yes Pulse Check Posterior Tibialis and Dorsalis pulse intact bilaterally: Yes Comments Callus on plantar surface of right foot at first MTP joint.     No results found for any visits on 02/15/23.    Assessment & Plan:  Controlled type 2 diabetes mellitus with complication, without long-term current use of insulin (HCC) -Controlled -Hemoglobin A1c 6.7% on 11/19/2022 -Foot exam done this visit -Patient to schedule eye exam. -Continue ACE I and statin -Continue metformin 1000 mg twice daily and weight loss efforts.  Class 1 obesity with serious comorbidity and body mass index (BMI) of 31.0 to 31.9 in adult, unspecified obesity type -Body mass index is 31.78 kg/m. -3 pound weight loss since February. -Continue lifestyle modifications -Will increase phentermine from 15 mg to 30 mg daily.  Patient to notify clinic if he experiences side effects from increased  dose. -     Phentermine HCl; Take 1 capsule (30 mg) 1-2 hours after breakfast.  Dispense: 30 capsule; Refill: 3  Essential hypertension -Controlled -Continue lisinopril 10 mg daily and lifestyle modifications   Return in about 3 months (around 05/18/2023).   Deeann Saint, MD

## 2023-04-24 ENCOUNTER — Encounter (INDEPENDENT_AMBULATORY_CARE_PROVIDER_SITE_OTHER): Payer: Self-pay

## 2023-05-20 ENCOUNTER — Encounter: Payer: Self-pay | Admitting: Family Medicine

## 2023-05-20 ENCOUNTER — Ambulatory Visit: Payer: 59 | Admitting: Family Medicine

## 2023-05-20 VITALS — BP 120/80 | HR 81 | Temp 98.4°F | Ht 68.0 in | Wt 192.8 lb

## 2023-05-20 DIAGNOSIS — R634 Abnormal weight loss: Secondary | ICD-10-CM

## 2023-05-20 DIAGNOSIS — E118 Type 2 diabetes mellitus with unspecified complications: Secondary | ICD-10-CM | POA: Diagnosis not present

## 2023-05-20 DIAGNOSIS — I10 Essential (primary) hypertension: Secondary | ICD-10-CM

## 2023-05-20 DIAGNOSIS — R3911 Hesitancy of micturition: Secondary | ICD-10-CM

## 2023-05-20 DIAGNOSIS — Z7984 Long term (current) use of oral hypoglycemic drugs: Secondary | ICD-10-CM | POA: Diagnosis not present

## 2023-05-20 DIAGNOSIS — N401 Enlarged prostate with lower urinary tract symptoms: Secondary | ICD-10-CM

## 2023-05-20 DIAGNOSIS — R0683 Snoring: Secondary | ICD-10-CM

## 2023-05-20 LAB — POCT GLYCOSYLATED HEMOGLOBIN (HGB A1C): Hemoglobin A1C: 5.9 % — AB (ref 4.0–5.6)

## 2023-05-20 MED ORDER — LISINOPRIL 10 MG PO TABS
10.0000 mg | ORAL_TABLET | Freq: Every day | ORAL | 3 refills | Status: DC
Start: 2023-05-20 — End: 2024-08-04

## 2023-05-20 MED ORDER — PHENTERMINE HCL 30 MG PO CAPS
ORAL_CAPSULE | ORAL | 3 refills | Status: DC
Start: 2023-05-20 — End: 2023-08-29

## 2023-05-20 MED ORDER — TAMSULOSIN HCL 0.4 MG PO CAPS: 0.4000 mg | ORAL_CAPSULE | Freq: Every day | ORAL | 3 refills | Status: DC

## 2023-05-20 MED ORDER — METFORMIN HCL 1000 MG PO TABS
1000.0000 mg | ORAL_TABLET | Freq: Two times a day (BID) | ORAL | 3 refills | Status: DC
Start: 2023-05-20 — End: 2024-08-04

## 2023-05-20 NOTE — Patient Instructions (Signed)
Your Hemoglobin A1C was 5.9% this visit.    Your BMI was 31.78 and is not 29.32.  You have lost 20 lbs.

## 2023-05-20 NOTE — Progress Notes (Signed)
Established Patient Office Visit   Subjective  Patient ID: Derrick West, male    DOB: [birth year]  Age: 47 y.o. MRN: [id number]  Chief Complaint  Patient presents with   Diabetes    BG- 107    Patient is a 47 year old male who presents for follow-up.  Patient states has been doing well overall.  Taking phentermine 30 mg at 6:30 AM.  Tries to eat a larger breakfast.  May have fruit or something small for lunch.  Endorses feeling ravenous by 7:30 PM.  Has lost 20 pounds since February.  Was 212 pounds.  Working out at [employer] more.  Taking metformin 1000 mg twice daily.  Taking BS in AM.  Typically 100s.  Was 107 this morning.  Patient notes difficulty staying asleep which has been an ongoing issue.  Patient states he has been told that he snores in the past.  Unable to take naps during the day but would if could.  Patient also notes difficulty starting urine stream/dribbling a few times per week.  Denies nocturia.    Past Medical History:  Diagnosis Date   Anxiety and depression    Cholecystitis    DIABETES MELLITUS, TYPE II 12/24/2007   Fatty liver    GANGLION CYST, WRIST, LEFT 07/29/2007   Headache(784.0)    Hypertension    OBESITY 12/24/2007   Past Surgical History:  Procedure Laterality Date   ANTERIOR CERVICAL DECOMP/DISCECTOMY FUSION N/A 04/15/2013   Procedure: ANTERIOR CERVICAL DECOMPRESSION/DISCECTOMY FUSION 1 LEVEL;  Surgeon: Carmela Hurt, MD;  Location: [facility];  Service: Neurosurgery;  Laterality: N/A;  Cervical Five-Six Anterior Cervical decompression with fusion plating and bonegraft   CHOLECYSTECTOMY N/A 09/05/2017   Procedure: LAPAROSCOPIC CHOLECYSTECTOMY;  Surgeon: Abigail Miyamoto, MD;  Location: [facility];  Service: General;  Laterality: N/A;   ERCP N/A 09/14/2017   Procedure: ENDOSCOPIC RETROGRADE CHOLANGIOPANCREATOGRAPHY (ERCP);  Surgeon: Iva Boop, MD;  Location: [facility] END[facility]COPY;  Service: Endoscopy;  Laterality: N/A;   ESOPHAGOGASTRODUODENOSCOPY (EGD)  WITH PROPOFOL N/A 12/10/2017   Procedure: ESOPHAGOGASTRODUODENOSCOPY (EGD) WITH PROPOFOL;  Surgeon: Iva Boop, MD;  Location: [facility];  Service: Endoscopy;  Laterality: N/A;   GASTROINTESTINAL STENT REMOVAL N/A 12/10/2017   Procedure: GASTROINTESTINAL STENT REMOVAL;  Surgeon: Iva Boop, MD;  Location: [facility];  Service: Endoscopy;  Laterality: N/A;   LUMBAR LAMINECTOMY     VSD REPAIR     age 44   Social History   Tobacco Use   Smoking status: Never   Smokeless tobacco: Never  Vaping Use   Vaping status: Never Used  Substance Use Topics   Alcohol use: Yes    Comment: occ   Drug use: No   Family History  Problem Relation Age of Onset   Diabetes Mother    Colon polyps Father    Colon cancer Neg Hx    Esophageal cancer Neg Hx    Prostate cancer Neg Hx    Rectal cancer Neg Hx    Allergies  Allergen Reactions   Amoxicillin Other (See Comments)    From childhood; reaction not recalled      ROS Negative unless stated above    Objective:     BP 120/80 (BP Location: Right Arm, Patient Position: Sitting, Cuff Size: Normal)   Pulse 81   Temp 98.4 F (36.9 C) (Oral)   Ht 5\' 8"  (1.727 m)   Wt 192 lb 12.8 oz (87.5 kg)   SpO2 98%   BMI 29.32 kg/m  BP Readings from Last 3 Encounters:  05/20/23 120/80  02/15/23 126/70  11/19/22 130/80   Wt Readings from Last 3 Encounters:  05/20/23 192 lb 12.8 oz (87.5 kg)  02/15/23 209 lb (94.8 kg)  11/19/22 212 lb 6.4 oz (96.3 kg)      Physical Exam Constitutional:      General: He is not in acute distress.    Appearance: Normal appearance.  HENT:     Head: Normocephalic and atraumatic.     Nose: Nose normal.     Mouth/Throat:     Mouth: Mucous membranes are moist.  Cardiovascular:     Rate and Rhythm: Normal rate and regular rhythm.     Heart sounds: Normal heart sounds. No murmur heard.    No gallop.  Pulmonary:     Effort: Pulmonary effort is normal. No respiratory distress.     Breath sounds:  Normal breath sounds. No wheezing, rhonchi or rales.  Skin:    General: Skin is warm and dry.  Neurological:     Mental Status: He is alert and oriented to person, place, and time.      Results for orders placed or performed in visit on 05/20/23  POC HgB A1c  Result Value Ref Range   Hemoglobin A1C 5.9 (A) 4.0 - 5.6 %   HbA1c POC (<> result, manual entry)     HbA1c, POC (prediabetic range)     HbA1c, POC (controlled diabetic range)        Assessment & Plan:  Controlled type 2 diabetes mellitus with complication, without long-term current use of insulin (HCC) -Hemoglobin A1c 5.9% this visit.  Previously 6.7% on 11/19/2022 -Continue lifestyle modifications -Will refill metformin 1000 mg.  Will have patient take once daily instead of twice daily.  Continue to monitor blood sugar. -Continue ACE I and statin -Foot exam up-to-date -Eye exam due.  Will have patient schedule. -     metFORMIN HCl; Take 1 tablet (1,000 mg total) by mouth 2 (two) times daily with a meal.  Dispense: 180 tablet; Refill: 3 -     POCT glycosylated hemoglobin (Hb A1C)  Essential hypertension -Controlled -     Lisinopril; Take 1 tablet (10 mg total) by mouth daily.  Dispense: 90 tablet; Refill: 3  Weight loss -Was 212 pounds 11/19/2022.  Now 192.8 pounds.  Patient congratulated on 20 pound weight loss. -Phentermine refilled.  PDMP reviewed and without concern. -     Phentermine HCl; Take 1 capsule (30 mg) 1-2 hours after breakfast.  Dispense: 30 capsule; Refill: 3  Snoring -     Home sleep test  Benign prostatic hyperplasia with urinary hesitancy -     Tamsulosin HCl; Take 1 capsule (0.4 mg total) by mouth daily.  Dispense: 30 capsule; Refill: 3    Return in about 3 months (around 08/20/2023).   Deeann Saint, MD

## 2023-05-21 ENCOUNTER — Encounter: Payer: Self-pay | Admitting: Family Medicine

## 2023-05-21 ENCOUNTER — Ambulatory Visit: Payer: 59 | Admitting: Family Medicine

## 2023-05-21 VITALS — BP 120/68 | HR 84 | Temp 97.3°F | Ht 68.0 in | Wt 192.6 lb

## 2023-05-21 DIAGNOSIS — T887XXA Unspecified adverse effect of drug or medicament, initial encounter: Secondary | ICD-10-CM | POA: Diagnosis not present

## 2023-05-21 NOTE — Progress Notes (Signed)
Established Patient Office Visit   Subjective:  Patient ID: Derrick West, male    DOB: 1975/10/05  Age: 47 y.o. MRN: [id number]  Chief Complaint  Patient presents with   Medication Reaction    Pt states he started taking Flomax yesterday ans since he has had dizziness and lower blood pressure readings. Pt states he was sent home from work and was told he could not come back until he sees a doctor.     HPI Encounter Diagnoses  Name Primary?   Medication side effect Yes   Patient started on tamsulosin yesterday for what sounds like significant urinary hesitancy.  He has no nocturia.  He took his first pill this morning and went to work.  At work he bent over and when he stood up he experienced severe lightheadedness as though he was near syncopal.  Plan nurse was summoned.  He measured his blood pressure sitting at 106/60.  When he stood it went down to 80/60.  He assures me that he hydrates well.   Review of Systems  Constitutional: Negative.   HENT: Negative.    Eyes:  Negative for blurred vision, discharge and redness.  Respiratory: Negative.    Cardiovascular: Negative.   Gastrointestinal:  Negative for abdominal pain.  Genitourinary: Negative.   Musculoskeletal: Negative.  Negative for myalgias.  Skin:  Negative for rash.  Neurological:  Negative for tingling, loss of consciousness and weakness.  Endo/Heme/Allergies:  Negative for polydipsia.     Current Outpatient Medications:    aspirin EC 81 MG tablet, Take 1 tablet (81 mg total) by mouth daily. Swallow whole., Disp: 90 tablet, Rfl: 3   atorvastatin (LIPITOR) 20 MG tablet, TAKE 1 TABLET(10 MG) BY MOUTH DAILY, Disp: 90 tablet, Rfl: 3   glucose blood (ONETOUCH VERIO) test strip, Use as instructed, Disp: 100 strip, Rfl: 1   ibuprofen (ADVIL,MOTRIN) 200 MG tablet, Take 400 mg by mouth every 6 (six) hours as needed (for pain)., Disp: , Rfl:    lisinopril (ZESTRIL) 10 MG tablet, Take 1 tablet (10 mg total) by mouth daily.,  Disp: 90 tablet, Rfl: 3   metFORMIN (GLUCOPHAGE) 1000 MG tablet, Take 1 tablet (1,000 mg total) by mouth 2 (two) times daily with a meal., Disp: 180 tablet, Rfl: 3   phentermine 30 MG capsule, Take 1 capsule (30 mg) 1-2 hours after breakfast., Disp: 30 capsule, Rfl: 3   tamsulosin (FLOMAX) 0.4 MG CAPS capsule, Take 1 capsule (0.4 mg total) by mouth daily., Disp: 30 capsule, Rfl: 3   triamcinolone cream (KENALOG) 0.1 %, Apply 1 Application topically 2 (two) times daily., Disp: 60 g, Rfl: 0   Objective:     BP 120/68   Pulse 84   Temp (!) 97.3 F (36.3 C)   Ht 5\' 8"  (1.727 m)   Wt 192 lb 9.6 oz (87.4 kg)   SpO2 98%   BMI 29.28 kg/m    Physical Exam Constitutional:      General: He is not in acute distress.    Appearance: Normal appearance. He is not ill-appearing, toxic-appearing or diaphoretic.  HENT:     Head: Normocephalic and atraumatic.     Right Ear: External ear normal.     Left Ear: External ear normal.  Eyes:     General: No scleral icterus.       Right eye: No discharge.        Left eye: No discharge.     Extraocular Movements: Extraocular movements intact.  Conjunctiva/sclera: Conjunctivae normal.  Cardiovascular:     Rate and Rhythm: Normal rate and regular rhythm.  Pulmonary:     Effort: Pulmonary effort is normal. No respiratory distress.     Breath sounds: Normal breath sounds.  Skin:    General: Skin is warm and dry.  Neurological:     Mental Status: He is alert and oriented to person, place, and time.  Psychiatric:        Mood and Affect: Mood normal.        Behavior: Behavior normal.      No results found for any visits on 05/21/23.    The 10-year ASCVD risk score (Arnett DK, et al., 2019) is: 5.8%    Assessment & Plan:   Medication side effect    Return hold tamsulosin and follow up with Dr Salomon Fick..  May return to work tomorrow.  Could consider holding lisinopril or reducing dosage and continuing with tamsulosin?  Could try different  alpha-blocker.  Minimizing caffeine could be helpful.   Mliss Sax, MD

## 2023-06-18 ENCOUNTER — Other Ambulatory Visit: Payer: Self-pay | Admitting: Family Medicine

## 2023-06-18 DIAGNOSIS — N401 Enlarged prostate with lower urinary tract symptoms: Secondary | ICD-10-CM

## 2023-08-19 ENCOUNTER — Ambulatory Visit: Payer: 59 | Admitting: Family Medicine

## 2023-08-26 ENCOUNTER — Ambulatory Visit: Payer: 59 | Admitting: Family Medicine

## 2023-08-29 ENCOUNTER — Ambulatory Visit: Payer: 59 | Admitting: Family Medicine

## 2023-08-29 VITALS — BP 114/72 | HR 67 | Temp 97.9°F | Ht 68.0 in | Wt 175.4 lb

## 2023-08-29 DIAGNOSIS — F321 Major depressive disorder, single episode, moderate: Secondary | ICD-10-CM | POA: Diagnosis not present

## 2023-08-29 DIAGNOSIS — Z0000 Encounter for general adult medical examination without abnormal findings: Secondary | ICD-10-CM

## 2023-08-29 DIAGNOSIS — E118 Type 2 diabetes mellitus with unspecified complications: Secondary | ICD-10-CM

## 2023-08-29 DIAGNOSIS — F5102 Adjustment insomnia: Secondary | ICD-10-CM

## 2023-08-29 DIAGNOSIS — Z1159 Encounter for screening for other viral diseases: Secondary | ICD-10-CM

## 2023-08-29 DIAGNOSIS — R634 Abnormal weight loss: Secondary | ICD-10-CM

## 2023-08-29 DIAGNOSIS — Z0001 Encounter for general adult medical examination with abnormal findings: Secondary | ICD-10-CM | POA: Diagnosis not present

## 2023-08-29 DIAGNOSIS — Z23 Encounter for immunization: Secondary | ICD-10-CM

## 2023-08-29 DIAGNOSIS — L309 Dermatitis, unspecified: Secondary | ICD-10-CM

## 2023-08-29 DIAGNOSIS — E782 Mixed hyperlipidemia: Secondary | ICD-10-CM

## 2023-08-29 DIAGNOSIS — I10 Essential (primary) hypertension: Secondary | ICD-10-CM | POA: Diagnosis not present

## 2023-08-29 DIAGNOSIS — Z125 Encounter for screening for malignant neoplasm of prostate: Secondary | ICD-10-CM

## 2023-08-29 DIAGNOSIS — Z7984 Long term (current) use of oral hypoglycemic drugs: Secondary | ICD-10-CM

## 2023-08-29 LAB — CBC WITH DIFFERENTIAL/PLATELET
Basophils Absolute: 0.1 10*3/uL (ref 0.0–0.1)
Basophils Relative: 1.3 % (ref 0.0–3.0)
Eosinophils Absolute: 0.2 10*3/uL (ref 0.0–0.7)
Eosinophils Relative: 2.3 % (ref 0.0–5.0)
HCT: 43.5 % (ref 39.0–52.0)
Hemoglobin: 14.8 g/dL (ref 13.0–17.0)
Lymphocytes Relative: 25.3 % (ref 12.0–46.0)
Lymphs Abs: 2.2 10*3/uL (ref 0.7–4.0)
MCHC: 34 g/dL (ref 30.0–36.0)
MCV: 91.1 fL (ref 78.0–100.0)
Monocytes Absolute: 0.5 10*3/uL (ref 0.1–1.0)
Monocytes Relative: 5.9 % (ref 3.0–12.0)
Neutro Abs: 5.7 10*3/uL (ref 1.4–7.7)
Neutrophils Relative %: 65.2 % (ref 43.0–77.0)
Platelets: 267 10*3/uL (ref 150.0–400.0)
RBC: 4.77 Mil/uL (ref 4.22–5.81)
RDW: 12.8 % (ref 11.5–15.5)
WBC: 8.7 10*3/uL (ref 4.0–10.5)

## 2023-08-29 LAB — COMPREHENSIVE METABOLIC PANEL
ALT: 31 U/L (ref 0–53)
AST: 26 U/L (ref 0–37)
Albumin: 4.4 g/dL (ref 3.5–5.2)
Alkaline Phosphatase: 66 U/L (ref 39–117)
BUN: 25 mg/dL — ABNORMAL HIGH (ref 6–23)
CO2: 30 meq/L (ref 19–32)
Calcium: 9.9 mg/dL (ref 8.4–10.5)
Chloride: 105 meq/L (ref 96–112)
Creatinine, Ser: 0.93 mg/dL (ref 0.40–1.50)
GFR: 96.25 mL/min (ref >60.00)
Glucose, Bld: 121 mg/dL — ABNORMAL HIGH (ref 70–99)
Potassium: 5.1 meq/L (ref 3.5–5.1)
Sodium: 142 meq/L (ref 135–145)
Total Bilirubin: 0.6 mg/dL (ref 0.2–1.2)
Total Protein: 6.8 g/dL (ref 6.0–8.3)

## 2023-08-29 LAB — TESTOSTERONE: Testosterone: 380.91 ng/dL (ref 300.00–890.00)

## 2023-08-29 LAB — LIPID PANEL
Cholesterol: 181 mg/dL (ref 0–200)
HDL: 49.8 mg/dL (ref >39.00)
LDL Cholesterol: 116 mg/dL — ABNORMAL HIGH (ref 0–99)
NonHDL: 131.49
Total CHOL/HDL Ratio: 4
Triglycerides: 75 mg/dL (ref 0.0–149.0)
VLDL: 15 mg/dL (ref 0.0–40.0)

## 2023-08-29 LAB — PSA: PSA: 0.57 ng/mL (ref 0.10–4.00)

## 2023-08-29 LAB — T4, FREE: Free T4: 0.76 ng/dL (ref 0.60–1.60)

## 2023-08-29 LAB — TSH: TSH: 1.49 u[IU]/mL (ref 0.35–5.50)

## 2023-08-29 LAB — HEMOGLOBIN A1C: Hgb A1c MFr Bld: 6.4 % (ref 4.6–6.5)

## 2023-08-29 MED ORDER — PHENTERMINE HCL 30 MG PO CAPS: ORAL_CAPSULE | ORAL | 3 refills | Status: DC

## 2023-08-29 MED ORDER — TRIAMCINOLONE ACETONIDE 0.1 % EX CREA
1.0000 | TOPICAL_CREAM | Freq: Two times a day (BID) | CUTANEOUS | 0 refills | Status: DC
Start: 2023-08-29 — End: 2023-12-18

## 2023-08-29 MED ORDER — ATORVASTATIN CALCIUM 20 MG PO TABS: ORAL_TABLET | ORAL | 3 refills | Status: DC

## 2023-08-29 NOTE — Patient Instructions (Signed)
Do not forget to set up an appointment for an eye exam for diabetic retinopathy screening.  You can try taking melatonin at night to help with sleep.

## 2023-08-29 NOTE — Progress Notes (Signed)
Established Patient Office Visit   Subjective  Patient ID: Derrick West, male    DOB: September 18, 1976  Age: 47 y.o. MRN: [id number]  Chief Complaint  Patient presents with   Medical Management of Chronic Issues    DM- patient hasn't been taking, normally runs at 100    Patient is a 47 year old male seen for CPE/follow-up.  Patient is not fasting.  Patient states he is doing okay.  Has not checked blood sugar last week as was out of town.  States typically 100.  Taking metformin 1000 mg daily.  Requesting refill on triamcinolone cream.  BP controlled on lisinopril 10 mg daily.  Still having difficulty staying asleep.  Has concerns medication may cause him to be too drowsy.  In the past tried trazodone 50 mg which did not help and hydroxyzine.  Patient notes insomnia since the passing of his wife.  Endorses occasional abdominal pain with palpation.  States mood is okay.  Inquires about checking testosterone.  Patient losing weight on phentermine.  Previously 270, now 175 pounds.  Hoping to get to 170 LBS.  Patient was unable to take GLP-1's 2/2 history of pancreatitis.    Patient Active Problem List   Diagnosis Date Noted   Depression, major, single episode, moderate (HCC) 07/28/2020   Anxiety 07/28/2020   Dyslipidemia 04/01/2018   Other acute pancreatitis with uninfected necrosis    Upper abdominal pain    Leukocytosis    Bile leak 09/13/2017   Acute cholecystitis 09/05/2017   Acute calculous cholecystitis 09/05/2017   Adjustment disorder with mixed anxiety and depressed mood 12/23/2014   Neck pain 11/24/2012   Diabetes mellitus without complication (HCC) 12/24/2007   OBESITY 12/24/2007   GANGLION CYST, WRIST, LEFT 07/29/2007   Past Medical History:  Diagnosis Date   Anxiety and depression    Cholecystitis    DIABETES MELLITUS, TYPE II 12/24/2007   Fatty liver    GANGLION CYST, WRIST, LEFT 07/29/2007   Headache(784.0)    Hypertension    OBESITY 12/24/2007   Past Surgical  History:  Procedure Laterality Date   ANTERIOR CERVICAL DECOMP/DISCECTOMY FUSION N/A 04/15/2013   Procedure: ANTERIOR CERVICAL DECOMPRESSION/DISCECTOMY FUSION 1 LEVEL;  Surgeon: Carmela Hurt, MD;  Location: [facility];  Service: Neurosurgery;  Laterality: N/A;  Cervical Five-Six Anterior Cervical decompression with fusion plating and bonegraft   CHOLECYSTECTOMY N/A 09/05/2017   Procedure: LAPAROSCOPIC CHOLECYSTECTOMY;  Surgeon: Abigail Miyamoto, MD;  Location: [facility] OR;  Service: General;  Laterality: N/A;   ERCP N/A 09/14/2017   Procedure: ENDOSCOPIC RETROGRADE CHOLANGIOPANCREATOGRAPHY (ERCP);  Surgeon: Iva Boop, MD;  Location: [facility] ENDOSCOPY;  Service: Endoscopy;  Laterality: N/A;   ESOPHAGOGASTRODUODENOSCOPY (EGD) WITH PROPOFOL N/A 12/10/2017   Procedure: ESOPHAGOGASTRODUODENOSCOPY (EGD) WITH PROPOFOL;  Surgeon: Iva Boop, MD;  Location: [facility] ENDOSCOPY;  Service: Endoscopy;  Laterality: N/A;   GASTROINTESTINAL STENT REMOVAL N/A 12/10/2017   Procedure: GASTROINTESTINAL STENT REMOVAL;  Surgeon: Iva Boop, MD;  Location: [facility] ENDOSCOPY;  Service: Endoscopy;  Laterality: N/A;   LUMBAR LAMINECTOMY     VSD REPAIR     age 68   Social History   Tobacco Use   Smoking status: Never   Smokeless tobacco: Never  Vaping Use   Vaping status: Never Used  Substance Use Topics   Alcohol use: Yes    Comment: occ   Drug use: No   Family History  Problem Relation Age of Onset   Diabetes Mother    Colon polyps Father  Colon cancer Neg Hx    Esophageal cancer Neg Hx    Prostate cancer Neg Hx    Rectal cancer Neg Hx    Allergies  Allergen Reactions   Amoxicillin Other (See Comments)    From childhood; reaction not recalled      ROS Negative unless stated above    Objective:     BP 114/72 (BP Location: Left Arm, Patient Position: Sitting, Cuff Size: Large)   Pulse 67   Temp 97.9 F (36.6 C) (Oral)   Ht 5\' 8"  (1.727 m)   Wt 175 lb 6.4 oz (79.6 kg)   SpO2 97%   BMI  26.67 kg/m  BP Readings from Last 3 Encounters:  08/29/23 114/72  05/21/23 120/68  05/20/23 120/80   Wt Readings from Last 3 Encounters:  08/29/23 175 lb 6.4 oz (79.6 kg)  05/21/23 192 lb 9.6 oz (87.4 kg)  05/20/23 192 lb 12.8 oz (87.5 kg)      Physical Exam Constitutional:      Appearance: Normal appearance.  HENT:     Head: Normocephalic and atraumatic.     Right Ear: Tympanic membrane, ear canal and external ear normal.     Left Ear: Tympanic membrane, ear canal and external ear normal.     Nose: Nose normal.     Mouth/Throat:     Mouth: Mucous membranes are moist.     Pharynx: No oropharyngeal exudate or posterior oropharyngeal erythema.  Eyes:     General: No scleral icterus.    Extraocular Movements: Extraocular movements intact.     Conjunctiva/sclera: Conjunctivae normal.     Pupils: Pupils are equal, round, and reactive to light.  Neck:     Thyroid: No thyromegaly.  Cardiovascular:     Rate and Rhythm: Normal rate and regular rhythm.     Pulses: Normal pulses.     Heart sounds: Normal heart sounds. No murmur heard.    No friction rub.  Pulmonary:     Effort: Pulmonary effort is normal.     Breath sounds: Normal breath sounds. No wheezing, rhonchi or rales.  Abdominal:     General: Bowel sounds are normal.     Palpations: Abdomen is soft.     Tenderness: There is generalized abdominal tenderness.  Musculoskeletal:        General: No deformity. Normal range of motion.  Lymphadenopathy:     Cervical: No cervical adenopathy.  Skin:    General: Skin is warm and dry.     Findings: No lesion.  Neurological:     General: No focal deficit present.     Mental Status: He is alert and oriented to person, place, and time.  Psychiatric:        Mood and Affect: Mood normal.        Thought Content: Thought content normal.       08/29/2023    1:46 PM 05/20/2023    8:47 AM 02/15/2023    8:36 AM  Depression screen PHQ 2/9  Decreased Interest 1 1 1   Down,  Depressed, Hopeless 1 2 1   PHQ - 2 Score 2 3 2   Altered sleeping 3 2 2   Tired, decreased energy 2 1 2   Change in appetite 2 1 3   Feeling bad or failure about yourself  1 1 1   Trouble concentrating 3 2 2   Moving slowly or fidgety/restless 2 0 0  Suicidal thoughts 0 0 0  PHQ-9 Score 15 10 12   Difficult doing work/chores Somewhat difficult  Somewhat difficult Somewhat difficult      08/29/2023    1:46 PM 05/20/2023    8:48 AM 02/15/2023    8:36 AM 11/07/2020    1:55 PM  GAD 7 : Generalized Anxiety Score  Nervous, Anxious, on Edge 0 2 1 2   Control/stop worrying 1 2 2 1   Worry too much - different things 1 1 2 1   Trouble relaxing 3 3 2 2   Restless 3 3 2 1   Easily annoyed or irritable 2 2 2 3   Afraid - awful might happen 1 1 0 0  Total GAD 7 Score 11 14 11 10   Anxiety Difficulty Somewhat difficult Somewhat difficult Somewhat difficult Somewhat difficult       No results found for any visits on 08/29/23.    Assessment & Plan:  Well adult exam -Age-appropriate health screenings discussed -Obtain labs -Colonoscopy up-to-date.  Done 10/05/2022 -Immunizations reviewed -Next CPE in 1 year  Encounter for hepatitis C screening test for low risk patient -     Hepatitis C antibody  Controlled type 2 diabetes mellitus with complication, without long-term current use of insulin (HCC) -A1c 5.6% on 05/20/2023 -Continue metformin 1000 mg daily -Continue ACE/statin -Foot exam up-to-date -Patient to schedule eye exam -     Hemoglobin A1c -     Microalbumin / creatinine urine ratio  Essential hypertension -Controlled -Continue lisinopril 10 mg daily -     Comprehensive metabolic panel -     CBC with Differential/Platelet -     Lipid panel -     TSH -     T4, free  Dermatitis -     Triamcinolone Acetonide; Apply 1 Application topically 2 (two) times daily.  Dispense: 60 g; Refill: 0  Need for Tdap vaccination -     Tdap vaccine greater than or equal to 7yo IM  Adjustment  insomnia -Sleep hygiene -Trazodone, hydroxyzine previously ineffective -Consider OTC melatonin.  Still having difficulty staying asleep discussed other medication options.  Patient will notify clinic if interested in prescription.  Prostate cancer screening -     PSA  Depression, major, single episode, moderate (HCC) -PHQ-9 score 15 -GAD-7 score 11 -Consider counseling -Consider medication options -     TSH -      T4, free -     Testosterone  Mixed hyperlipidemia -     Lipid panel -     Atorvastatin Calcium; TAKE 1 TABLET(10 MG) BY MOUTH DAILY  Dispense: 90 tablet; Refill: 3  Weight loss -Previously 270 lbs.  Now 175 lbs. goal weight 170. -Body mass index is 26.67 kg/m. -continue lifestyle modifications including diet and exercise -Phentermine refilled. -     Phentermine HCl; Take 1 capsule (30 mg) 1-2 hours after breakfast.  Dispense: 30 capsule; Refill: 3    Return in about 4 months (around 12/28/2023).   Deeann Saint, MD

## 2023-08-30 LAB — HEPATITIS C ANTIBODY: Hepatitis C Ab: NONREACTIVE

## 2023-10-11 ENCOUNTER — Other Ambulatory Visit: Payer: Self-pay | Admitting: Family Medicine

## 2023-10-11 DIAGNOSIS — R634 Abnormal weight loss: Secondary | ICD-10-CM

## 2023-12-18 ENCOUNTER — Other Ambulatory Visit: Payer: Self-pay | Admitting: Family Medicine

## 2023-12-18 DIAGNOSIS — L309 Dermatitis, unspecified: Secondary | ICD-10-CM

## 2023-12-26 ENCOUNTER — Telehealth: Admitting: Physician Assistant

## 2023-12-26 DIAGNOSIS — N401 Enlarged prostate with lower urinary tract symptoms: Secondary | ICD-10-CM

## 2023-12-26 NOTE — Progress Notes (Signed)
 Because we do not treat chronic conditions like BPH via e-visit, I feel your condition warrants further evaluation and I recommend that you be seen for a face to face visit.  Please contact your primary care physician practice to be seen. Many offices offer virtual options to be seen via video if you are not comfortable going in person to a medical facility at this time.  NOTE: You will NOT be charged for this eVisit.  If you do not have a PCP, [facility] offers a free physician referral service available at [phone]. Our trained staff has the experience, knowledge and resources to put you in touch with a physician who is right for you.    If you are having a true medical emergency please call 911.   Your e-visit answers were reviewed by a board certified advanced clinical practitioner to complete your personal care plan.  Thank you for using e-Visits.

## 2023-12-27 ENCOUNTER — Ambulatory Visit: Payer: 59 | Admitting: Family Medicine

## 2023-12-30 ENCOUNTER — Ambulatory Visit (INDEPENDENT_AMBULATORY_CARE_PROVIDER_SITE_OTHER): Admitting: Family Medicine

## 2023-12-30 VITALS — BP 120/76 | HR 107 | Temp 98.4°F | Ht 68.0 in | Wt 164.4 lb

## 2023-12-30 DIAGNOSIS — I10 Essential (primary) hypertension: Secondary | ICD-10-CM

## 2023-12-30 DIAGNOSIS — N3943 Post-void dribbling: Secondary | ICD-10-CM | POA: Diagnosis not present

## 2023-12-30 DIAGNOSIS — N401 Enlarged prostate with lower urinary tract symptoms: Secondary | ICD-10-CM | POA: Diagnosis not present

## 2023-12-30 DIAGNOSIS — N529 Male erectile dysfunction, unspecified: Secondary | ICD-10-CM

## 2023-12-30 DIAGNOSIS — Z7984 Long term (current) use of oral hypoglycemic drugs: Secondary | ICD-10-CM | POA: Diagnosis not present

## 2023-12-30 DIAGNOSIS — F411 Generalized anxiety disorder: Secondary | ICD-10-CM

## 2023-12-30 DIAGNOSIS — E118 Type 2 diabetes mellitus with unspecified complications: Secondary | ICD-10-CM | POA: Diagnosis not present

## 2023-12-30 DIAGNOSIS — F321 Major depressive disorder, single episode, moderate: Secondary | ICD-10-CM

## 2023-12-30 DIAGNOSIS — E782 Mixed hyperlipidemia: Secondary | ICD-10-CM

## 2023-12-30 DIAGNOSIS — R634 Abnormal weight loss: Secondary | ICD-10-CM

## 2023-12-30 LAB — MICROALBUMIN / CREATININE URINE RATIO
Creatinine,U: 284.7 mg/dL
Microalb Creat Ratio: 11.7 mg/g (ref 0.0–30.0)
Microalb, Ur: 3.3 mg/dL — ABNORMAL HIGH (ref 0.0–1.9)

## 2023-12-30 LAB — POC URINALSYSI DIPSTICK (AUTOMATED)
Bilirubin, UA: POSITIVE
Blood, UA: NEGATIVE
Glucose, UA: NEGATIVE
Ketones, UA: NEGATIVE
Leukocytes, UA: NEGATIVE
Nitrite, UA: NEGATIVE
Protein, UA: POSITIVE — AB
Spec Grav, UA: 1.02 (ref 1.010–1.025)
Urobilinogen, UA: 1 U/dL
pH, UA: 5.5 (ref 5.0–8.0)

## 2023-12-30 LAB — URINALYSIS, ROUTINE W REFLEX MICROSCOPIC
Bilirubin Urine: NEGATIVE
Hgb urine dipstick: NEGATIVE
Leukocytes,Ua: NEGATIVE
Nitrite: NEGATIVE
RBC / HPF: NONE SEEN (ref 0–?)
Specific Gravity, Urine: 1.02 (ref 1.000–1.030)
Urine Glucose: NEGATIVE
Urobilinogen, UA: 1 (ref 0.0–1.0)
pH: 6 (ref 5.0–8.0)

## 2023-12-30 MED ORDER — TADALAFIL 5 MG PO TABS
5.0000 mg | ORAL_TABLET | Freq: Every day | ORAL | 11 refills | Status: DC
Start: 2023-12-30 — End: 2024-08-04

## 2023-12-30 NOTE — Progress Notes (Signed)
 Established Patient Office Visit   Subjective  Patient ID: Derrick West, male    DOB: 02/21/1976  Age: 48 y.o. MRN: [id number]  Chief Complaint  Patient presents with   Follow-up    Patient came in today for a follow-up for Weight loss, DM, BP    Urinary Retention    Patient is a 48 year old male seen for follow-up.  Pt with BPH symptoms of postvoid dribbling, urinary retention, straining to start stream.  Denies nocturia, split stream, hematuria.  In the past Flomax  caused postural dizziness.  Patient mentions he tried Cialis  which helped urinary symptoms.  Patient also interested in prescription for ED. Just now starting to date s/p the passing of his wife.  Patient continuing to lose weight on phentermine  30 mg daily.  Exercising some and making changes to diet.  States has actually increased calorie intake to a little over 2000 calories per day.   Patient Active Problem List   Diagnosis Date Noted   Depression, major, single episode, moderate (HCC) 07/28/2020   Anxiety 07/28/2020   Dyslipidemia 04/01/2018   Other acute pancreatitis with uninfected necrosis    Upper abdominal pain    Leukocytosis    Bile leak 09/13/2017   Acute cholecystitis 09/05/2017   Acute calculous cholecystitis 09/05/2017   Adjustment disorder with mixed anxiety and depressed mood 12/23/2014   Neck pain 11/24/2012   Diabetes mellitus without complication (HCC) 12/24/2007   OBESITY 12/24/2007   GANGLION CYST, WRIST, LEFT 07/29/2007   Past Medical History:  Diagnosis Date   Anxiety and depression    Cholecystitis    DIABETES MELLITUS, TYPE II 12/24/2007   Fatty liver    GANGLION CYST, WRIST, LEFT 07/29/2007   Headache(784.0)    Hypertension    OBESITY 12/24/2007   Past Surgical History:  Procedure Laterality Date   ANTERIOR CERVICAL DECOMP/DISCECTOMY FUSION N/A 04/15/2013   Procedure: ANTERIOR CERVICAL DECOMPRESSION/DISCECTOMY FUSION 1 LEVEL;  Surgeon: Pasty Bongo, MD;  Location: [facility];   Service: Neurosurgery;  Laterality: N/A;  Cervical Five-Six Anterior Cervical decompression with fusion plating and bonegraft   CHOLECYSTECTOMY N/A 09/05/2017   Procedure: LAPAROSCOPIC CHOLECYSTECTOMY;  Surgeon: Oza Blumenthal, MD;  Location: [facility] OR;  Service: General;  Laterality: N/A;   ERCP N/A 09/14/2017   Procedure: ENDOSCOPIC RETROGRADE CHOLANGIOPANCREATOGRAPHY (ERCP);  Surgeon: Kenney Peacemaker, MD;  Location: [facility] ENDOSCOPY;  Service: Endoscopy;  Laterality: N/A;   ESOPHAGOGASTRODUODENOSCOPY (EGD) WITH PROPOFOL  N/A 12/10/2017   Procedure: ESOPHAGOGASTRODUODENOSCOPY (EGD) WITH PROPOFOL ;  Surgeon: Kenney Peacemaker, MD;  Location: [facility] ENDOSCOPY;  Service: Endoscopy;  Laterality: N/A;   GASTROINTESTINAL STENT REMOVAL N/A 12/10/2017   Procedure: GASTROINTESTINAL STENT REMOVAL;  Surgeon: Kenney Peacemaker, MD;  Location: [facility] ENDOSCOPY;  Service: Endoscopy;  Laterality: N/A;   LUMBAR LAMINECTOMY     VSD REPAIR     age 31   Social History   Tobacco Use   Smoking status: Never   Smokeless tobacco: Never  Vaping Use   Vaping status: Never Used  Substance Use Topics   Alcohol use: Yes    Comment: occ   Drug use: No   Family History  Problem Relation Age of Onset   Diabetes Mother    Colon polyps Father    Colon cancer Neg Hx    Esophageal cancer Neg Hx    Prostate cancer Neg Hx    Rectal cancer Neg Hx    Allergies  Allergen Reactions   Amoxicillin Other (See Comments)    From  childhood; reaction not recalled      ROS Negative unless stated above    Objective:     BP 120/76 (BP Location: Left Arm, Patient Position: Sitting, Cuff Size: Normal)   Pulse (!) 107   Temp 98.4 F (36.9 C) (Oral)   Ht 5\' 8"  (1.727 m)   Wt 164 lb 6.4 oz (74.6 kg)   SpO2 97%   BMI 25.00 kg/m  BP Readings from Last 3 Encounters:  12/30/23 120/76  08/29/23 114/72  05/21/23 120/68   Wt Readings from Last 3 Encounters:  12/30/23 164 lb 6.4 oz (74.6 kg)  08/29/23 175 lb 6.4 oz (79.6 kg)   05/21/23 192 lb 9.6 oz (87.4 kg)      Physical Exam Constitutional:      General: He is not in acute distress.    Appearance: Normal appearance.  HENT:     Head: Normocephalic and atraumatic.     Nose: Nose normal.     Mouth/Throat:     Mouth: Mucous membranes are moist.  Cardiovascular:     Rate and Rhythm: Normal rate and regular rhythm.     Heart sounds: Normal heart sounds. No murmur heard.    No gallop.  Pulmonary:     Effort: Pulmonary effort is normal. No respiratory distress.     Breath sounds: Normal breath sounds. No wheezing, rhonchi or rales.  Skin:    General: Skin is warm and dry.  Neurological:     Mental Status: He is alert and oriented to person, place, and time.       12/30/2023   12:02 PM 08/29/2023    1:46 PM 05/20/2023    8:47 AM  Depression screen PHQ 2/9  Decreased Interest 1 1 1   Down, Depressed, Hopeless 1 1 2   PHQ - 2 Score 2 2 3   Altered sleeping 3 3 2   Tired, decreased energy 2 2 1   Change in appetite 2 2 1   Feeling bad or failure about yourself  1 1 1   Trouble concentrating 3 3 2   Moving slowly or fidgety/restless 0 2 0  Suicidal thoughts 0 0 0  PHQ-9 Score 13 15 10   Difficult doing work/chores Not difficult at all Somewhat difficult Somewhat difficult      12/30/2023   12:03 PM 08/29/2023    1:46 PM 05/20/2023    8:48 AM 02/15/2023    8:36 AM  GAD 7 : Generalized Anxiety Score  Nervous, Anxious, on Edge 2 0 2 1  Control/stop worrying 2 1 2 2   Worry too much - different things 2 1 1 2   Trouble relaxing 3 3 3 2   Restless 3 3 3 2   Easily annoyed or irritable 0 2 2 2   Afraid - awful might happen 1 1 1  0  Total GAD 7 Score 13 11 14 11   Anxiety Difficulty Not difficult at all Somewhat difficult Somewhat difficult Somewhat difficult   No results found for any visits on 12/30/23.    Assessment & Plan:  Controlled type 2 diabetes mellitus with complication, without long-term current use of insulin  (HCC) -     Microalbumin / creatinine  urine ratio  Erectile dysfunction, unspecified erectile dysfunction type -     Tadalafil ; Take 1 tablet (5 mg total) by mouth daily.  Dispense: 30 tablet; Refill: 11  Benign prostatic hyperplasia with post-void dribbling -     Tadalafil ; Take 1 tablet (5 mg total) by mouth daily.  Dispense: 30 tablet; Refill: 11 -  Urinalysis, Routine w reflex microscopic; Future -     POCT Urinalysis Dipstick (Automated)  Weight loss  Depression, major, single episode, moderate (HCC)  GAD (generalized anxiety disorder)  Essential hypertension  Diabetes controlled.  Last Hemoglobin A1C was 6.4% on 08/29/23.  Continue Metformin  1000 mg BID.  Continue lifestyle modifications. Continue ACE-I (lisinopril  10 mg daily) and statin (lipitor 20 mg daily).  BP controlled.  Discussed weaning phentermine  at next OFV, then stopping as on x~1 yr.   Current wt 164.4lbs, starting wt was 212 lbs.  Current BMI 25 kg/m.  Counseling encouraged.  PHQ 9 and GAD 7 scores 13 this visit, previously 15 and 11 respectively.  Start Cialis  for ED and BPH symptoms.  For continued symptoms referral to Urology.   Return if symptoms worsen or fail to improve.   Viola Greulich, MD

## 2024-01-02 ENCOUNTER — Encounter: Payer: Self-pay | Admitting: Family Medicine

## 2024-01-18 ENCOUNTER — Encounter: Payer: Self-pay | Admitting: Family Medicine

## 2024-01-20 ENCOUNTER — Other Ambulatory Visit: Payer: Self-pay | Admitting: Family Medicine

## 2024-01-20 DIAGNOSIS — N529 Male erectile dysfunction, unspecified: Secondary | ICD-10-CM

## 2024-01-20 DIAGNOSIS — N3943 Post-void dribbling: Secondary | ICD-10-CM

## 2024-01-25 ENCOUNTER — Other Ambulatory Visit: Payer: Self-pay | Admitting: Family Medicine

## 2024-01-25 DIAGNOSIS — L309 Dermatitis, unspecified: Secondary | ICD-10-CM

## 2024-03-11 ENCOUNTER — Other Ambulatory Visit: Payer: Self-pay | Admitting: Family Medicine

## 2024-03-11 DIAGNOSIS — R634 Abnormal weight loss: Secondary | ICD-10-CM

## 2024-03-30 ENCOUNTER — Ambulatory Visit: Admitting: Family Medicine

## 2024-07-22 ENCOUNTER — Other Ambulatory Visit: Payer: Self-pay

## 2024-07-22 ENCOUNTER — Emergency Department (HOSPITAL_COMMUNITY)

## 2024-07-22 ENCOUNTER — Encounter (HOSPITAL_COMMUNITY): Payer: Self-pay

## 2024-07-22 ENCOUNTER — Emergency Department (HOSPITAL_COMMUNITY)
Admission: EM | Admit: 2024-07-22 | Discharge: 2024-07-23 | Disposition: A | Discharge status: Home / Self Care | Attending: Emergency Medicine | Admitting: Emergency Medicine

## 2024-07-22 DIAGNOSIS — R0789 Other chest pain: Secondary | ICD-10-CM | POA: Insufficient documentation

## 2024-07-22 DIAGNOSIS — I10 Essential (primary) hypertension: Secondary | ICD-10-CM | POA: Diagnosis not present

## 2024-07-22 DIAGNOSIS — E119 Type 2 diabetes mellitus without complications: Secondary | ICD-10-CM | POA: Diagnosis not present

## 2024-07-22 DIAGNOSIS — Z7982 Long term (current) use of aspirin: Secondary | ICD-10-CM | POA: Insufficient documentation

## 2024-07-22 DIAGNOSIS — Z7984 Long term (current) use of oral hypoglycemic drugs: Secondary | ICD-10-CM | POA: Insufficient documentation

## 2024-07-22 DIAGNOSIS — R079 Chest pain, unspecified: Secondary | ICD-10-CM

## 2024-07-22 LAB — CBC WITH DIFFERENTIAL/PLATELET
Abs Immature Granulocytes: 0.03 K/uL (ref 0.00–0.07)
Basophils Absolute: 0.1 K/uL (ref 0.0–0.1)
Basophils Relative: 1 %
Eosinophils Absolute: 0.2 K/uL (ref 0.0–0.5)
Eosinophils Relative: 2 %
HCT: 42.8 % (ref 39.0–52.0)
Hemoglobin: 13.7 g/dL (ref 13.0–17.0)
Immature Granulocytes: 0 %
Lymphocytes Relative: 23 %
Lymphs Abs: 2.1 K/uL (ref 0.7–4.0)
MCH: 25.8 pg — ABNORMAL LOW (ref 26.0–34.0)
MCHC: 32 g/dL (ref 30.0–36.0)
MCV: 80.5 fL (ref 80.0–100.0)
Monocytes Absolute: 0.8 K/uL (ref 0.1–1.0)
Monocytes Relative: 9 %
Neutro Abs: 5.9 K/uL (ref 1.7–7.7)
Neutrophils Relative %: 65 %
Platelets: 228 K/uL (ref 150–400)
RBC: 5.32 MIL/uL (ref 4.22–5.81)
RDW: 13.3 % (ref 11.5–15.5)
WBC: 9.2 K/uL (ref 4.0–10.5)
nRBC: 0 % (ref 0.0–0.2)

## 2024-07-22 LAB — COMPREHENSIVE METABOLIC PANEL WITH GFR
ALT: 20 U/L (ref 0–44)
AST: 24 U/L (ref 15–41)
Albumin: 3.5 g/dL (ref 3.5–5.0)
Alkaline Phosphatase: 56 U/L (ref 38–126)
Anion gap: 8 (ref 5–15)
BUN: 13 mg/dL (ref 6–20)
CO2: 26 mmol/L (ref 22–32)
Calcium: 8.8 mg/dL — ABNORMAL LOW (ref 8.9–10.3)
Chloride: 104 mmol/L (ref 98–111)
Creatinine, Ser: 0.96 mg/dL (ref 0.61–1.24)
GFR, Estimated: >60 mL/min (ref >60)
Glucose, Bld: 147 mg/dL — ABNORMAL HIGH (ref 70–99)
Potassium: 3.7 mmol/L (ref 3.5–5.1)
Sodium: 138 mmol/L (ref 135–145)
Total Bilirubin: 0.6 mg/dL (ref 0.0–1.2)
Total Protein: 6.2 g/dL — ABNORMAL LOW (ref 6.5–8.1)

## 2024-07-22 LAB — TROPONIN I (HIGH SENSITIVITY): Troponin I (High Sensitivity): 8 ng/L (ref <18)

## 2024-07-22 LAB — T4, FREE: Free T4: 0.88 ng/dL (ref 0.61–1.12)

## 2024-07-22 LAB — D-DIMER, QUANTITATIVE: D-Dimer, Quant: <0.27 ug{FEU}/mL (ref 0.00–0.50)

## 2024-07-22 LAB — TSH: TSH: 3.009 u[IU]/mL (ref 0.350–4.500)

## 2024-07-22 NOTE — ED Provider Notes (Signed)
 [place] EMERGENCY DEPARTMENT AT [facility] Provider Note   CSN: [id number] Arrival date & time: 07/22/24  2231     Patient presents with: Chest Pain   Derrick West is a 48 y.o. male.  {Add pertinent medical, surgical, social history, OB history to YEP:67052} The history is provided by the patient and medical records.  Chest Pain  48 year old male with history of obesity, diabetes, headaches, hypertension, anxiety and depression, presenting to the ED for chest discomfort.  Patient woke up this morning, was having a normal day.  Went to the gym around 1 PM, did cardio and was feeling fine while doing this.  Upon returning home he started to have some chest discomfort.  States intermittently throughout the evening he has felt like he has periods where he is short of breath.  Does seem to have a little bit of transient pain with breathing at times but this resolves after a few seconds.  He denies any cough or fever.  Has had a little bit of pain down the left arm as well today.  Recent fatigue and poor appetite, unclear reasoning for this.  No abdominal pain, vomiting, or diarrhea.  He denies any prior cardiac history.  He did have cardiac evaluation in 2023 with normal coronary CT.  Cardiology at that time did not feel his symptoms were cardiac.  Had prior VSD repair in childhood.  Prior to Admission medications   Medication Sig Start Date End Date Taking? Authorizing Provider  aspirin  EC 81 MG tablet Take 1 tablet (81 mg total) by mouth daily. Swallow whole. 09/25/22   Vannie Reche RAMAN, NP  atorvastatin  (LIPITOR) 20 MG tablet TAKE 1 TABLET(10 MG) BY MOUTH DAILY 08/29/23   Mercer Clotilda SAUNDERS, MD  glucose blood (ONETOUCH VERIO) test strip Use as instructed 07/19/22   Mercer Clotilda SAUNDERS, MD  ibuprofen (ADVIL,MOTRIN) 200 MG tablet Take 400 mg by mouth every 6 (six) hours as needed (for pain).    [provider]  lisinopril  (ZESTRIL ) 10 MG tablet Take 1 tablet (10 mg total) by  mouth daily. 05/20/23   Mercer Clotilda SAUNDERS, MD  metFORMIN  (GLUCOPHAGE ) 1000 MG tablet Take 1 tablet (1,000 mg total) by mouth 2 (two) times daily with a meal. 05/20/23   Mercer Clotilda SAUNDERS, MD  phentermine  30 MG capsule TAKE 1 CAPSULE (30 MG) 1-2 HOURS AFTER BREAKFAST. 10/17/23   Mercer Clotilda SAUNDERS, MD  tadalafil  (CIALIS ) 5 MG tablet Take 1 tablet (5 mg total) by mouth daily. 12/30/23   Mercer Clotilda SAUNDERS, MD  triamcinolone  cream (KENALOG ) 0.1 % APPLY TO AFFECTED AREA TWICE A DAY 01/31/24   Mercer Clotilda SAUNDERS, MD    Allergies: Amoxicillin    Review of Systems  Cardiovascular:  Positive for chest pain.  All other systems reviewed and are negative.   Updated Vital Signs BP (!) 152/77   Pulse 72   Temp 98.1 F (36.7 C) (Oral)   Resp 15   Ht 5' 7 (1.702 m)   Wt 86.2 kg   SpO2 98%   BMI 29.76 kg/m   Physical Exam Vitals and nursing note reviewed.  Constitutional:      Appearance: He is well-developed.  HENT:     Head: Normocephalic and atraumatic.  Eyes:     Conjunctiva/sclera: Conjunctivae normal.     Pupils: Pupils are equal, round, and reactive to light.  Cardiovascular:     Rate and Rhythm: Normal rate and regular rhythm.     Heart sounds:  Normal heart sounds.  Pulmonary:     Effort: Pulmonary effort is normal.     Breath sounds: Normal breath sounds. No wheezing or rhonchi.  Chest:     Comments: Well healed scar noted to chest wall Abdominal:     General: Bowel sounds are normal.     Palpations: Abdomen is soft.  Musculoskeletal:        General: Normal range of motion.     Cervical back: Normal range of motion.  Skin:    General: Skin is warm and dry.  Neurological:     Mental Status: He is alert and oriented to person, place, and time.     (all labs ordered are listed, but only abnormal results are displayed) Labs Reviewed - No data to display  EKG: None  Radiology: No results found.  {Document cardiac monitor, telemetry assessment procedure when  appropriate:32947} Procedures   Medications Ordered in the ED - No data to display    {Click here for ABCD2, HEART and other calculators REFRESH Note before signing:1}                              Medical Decision Making Amount and/or Complexity of Data Reviewed Labs: ordered. Radiology: ordered.   ***  {Document critical care time when appropriate  Document review of labs and clinical decision tools ie CHADS2VASC2, etc  Document your independent review of radiology images and any outside records  Document your discussion with family members, caretakers and with consultants  Document social determinants of health affecting pt's care  Document your decision making why or why not admission, treatments were needed:32947:::1}   Final diagnoses:  None    ED Discharge Orders     None

## 2024-07-22 NOTE — ED Triage Notes (Signed)
 Pt BIB [facility] from home. Pt has had consistent left sided jaw pain and intermittent chest pain that radiates to his back, into his shoulder blades. This started today around 1400 after going to the gym. GCS 15. EMS gave 324 ASA  EMS 134/80BP 76P 96% RA 18R

## 2024-07-23 LAB — TROPONIN I (HIGH SENSITIVITY): Troponin I (High Sensitivity): 9 ng/L (ref <18)

## 2024-07-23 MED ORDER — IBUPROFEN 800 MG PO TABS
800.0000 mg | ORAL_TABLET | Freq: Once | ORAL | Status: AC
  Administered 2024-07-23: 800 mg via ORAL
  Filled 2024-07-23: qty 1

## 2024-07-23 NOTE — Discharge Instructions (Signed)
 Cardiac workup today was reassuring. Recommend some close follow-up with her cardiologist, especially if symptoms persist. Return here for new concerns.

## 2024-07-31 ENCOUNTER — Ambulatory Visit: Admitting: Student in an Organized Health Care Education/Training Program

## 2024-08-04 ENCOUNTER — Other Ambulatory Visit: Payer: Self-pay | Admitting: Family Medicine

## 2024-08-04 ENCOUNTER — Ambulatory Visit (HOSPITAL_BASED_OUTPATIENT_CLINIC_OR_DEPARTMENT_OTHER): Admitting: Family

## 2024-08-04 ENCOUNTER — Encounter (HOSPITAL_BASED_OUTPATIENT_CLINIC_OR_DEPARTMENT_OTHER): Payer: Self-pay | Admitting: Family

## 2024-08-04 ENCOUNTER — Other Ambulatory Visit (HOSPITAL_BASED_OUTPATIENT_CLINIC_OR_DEPARTMENT_OTHER): Payer: Self-pay

## 2024-08-04 VITALS — BP 110/66 | HR 81 | Ht 67.5 in | Wt 192.7 lb

## 2024-08-04 DIAGNOSIS — I10 Essential (primary) hypertension: Secondary | ICD-10-CM

## 2024-08-04 DIAGNOSIS — R0602 Shortness of breath: Secondary | ICD-10-CM

## 2024-08-04 DIAGNOSIS — E785 Hyperlipidemia, unspecified: Secondary | ICD-10-CM | POA: Diagnosis not present

## 2024-08-04 DIAGNOSIS — I25118 Atherosclerotic heart disease of native coronary artery with other forms of angina pectoris: Secondary | ICD-10-CM | POA: Diagnosis not present

## 2024-08-04 DIAGNOSIS — N529 Male erectile dysfunction, unspecified: Secondary | ICD-10-CM

## 2024-08-04 DIAGNOSIS — N401 Enlarged prostate with lower urinary tract symptoms: Secondary | ICD-10-CM

## 2024-08-04 DIAGNOSIS — R072 Precordial pain: Secondary | ICD-10-CM

## 2024-08-04 MED ORDER — ROSUVASTATIN CALCIUM 20 MG PO TABS
20.0000 mg | ORAL_TABLET | ORAL | 2 refills | Status: AC
  Filled 2024-08-04: qty 12, 28d supply, fill #0
  Filled 2024-08-21 – 2024-08-25 (×3): qty 12, 28d supply, fill #1
  Filled 2024-09-24: qty 12, 28d supply, fill #2

## 2024-08-04 MED ORDER — METOPROLOL TARTRATE 100 MG PO TABS
100.0000 mg | ORAL_TABLET | Freq: Once | ORAL | 0 refills | Status: DC
  Filled 2024-08-04: qty 1, 1d supply, fill #0

## 2024-08-04 NOTE — Progress Notes (Signed)
 " Cardiology Office Note   Date:  08/04/2024  ID:  Derrick West, DOB [birth year], MRN [id number] PCP: Mercer Clotilda SAUNDERS, MD  [facility] Providers Cardiologist:  Shelda Bruckner, MD     History of Present Illness Derrick West is a 48 y.o. 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  Established with cardiology 09/04/2022 for chest pain after ED visit 07/2022 for chest pain described as sudden tightness in the center of his chest.  He underwent cardiac CTA 09/19/2022 with calcium  score 193 placing him in the 95th percentile.  It was overall mild nonobstructive CAD 25-49% stenosis in the RCA.  Was advised to increase atorvastatin  dose and follow-up with Dr. Bruckner in 6 months.  Seen by ENT 07/02/24 for OSA with plan for sleep study.   ED visit 07/14/2024 with chest pain described as transient with some dyspnea.  Discussed the use of AI scribe software for clinical note transcription with the patient, who gave verbal consent to proceed.  History of Present Illness He is accompanied by his wife.  He experiences intermittent chest tightness described as a feeling of fullness, along with episodes of shortness of breath and left-sided jaw pain. These symptoms occur more frequently at rest after physical activity, particularly after work when sitting down in the evening watching television. There is no recent tingling down the arm, swelling in the feet or ankles, orthopnea, or paroxysmal nocturnal dyspnea.  He discontinued atorvastatin  due to concerns about erectile dysfunction and leg cramping and is currently not on any cholesterol medication. He uses tadalafil  for erectile dysfunction with improvement.   He is undergoing evaluation for potential sleep apnea, with an at-home sleep study completed and results pending. He has a deviated septum and grade four tonsils. He feels best when his  heart rate is elevated during exercise but experiences symptoms post-activity. He has not had a recent echocardiogram to assess heart function post-VSD repair.  ROS: Please see the history of present illness.    All other systems reviewed and are negative.   Studies Reviewed      Cardiac Studies & Procedures   ______________________________________________________________________________________________          CT SCANS  CT CORONARY MORPH W/CTA COR W/SCORE 09/19/2022  Addendum 09/19/2022  3:13 PM ADDENDUM REPORT: 09/19/2022 15:11  EXAM: OVER-READ INTERPRETATION CARDIAC CT CHEST  The following report is an over-read performed by radiologist Dr. Ryan Salvage of [facility] on 09/19/2022. This over-read does not include interpretation of cardiac or coronary anatomy or pathology. The coronary CTA interpretation by the cardiologist is attached.  COMPARISON:  Chest radiograph 08/03/2022  FINDINGS: Extracardiac Vascular: Unremarkable  Mediastinum: Calcified posterior left hilar node compatible with old granulomatous disease.  Lung: 0.7 cm calcified left lower lobe granuloma medially on image 25 series 12. This finding is benign and does not warrant further workup.  Included Upper Abdomen: Unremarkable  Musculoskeletal: Small sternotomy wires indicating remote (childhood) sternotomy.  IMPRESSION: 1. No significant extracardiac findings. 2. Old granulomatous disease. 3. Prior (childhood) sternotomy.   Electronically Signed By: Ryan Salvage M.D. On: 09/19/2022 15:11  Narrative CLINICAL DATA:  This is a 48 year old male with anginal symptoms.  EXAM: Cardiac/Coronary  CTA  TECHNIQUE: The patient was scanned on a [employer].  FINDINGS: A 100 kV prospective scan was triggered in the descending thoracic aorta at 111 HU's. Axial non-contrast 3 mm slices were carried  out through the heart. The data set was analyzed on a  dedicated work station and scored using the Agatson method. Gantry rotation speed was 250 msecs and collimation was .6 mm. No beta blockade and 0.8 mg of sl NTG was given. The 3D data set was reconstructed in 5% intervals of the 67-82 % of the R-R cycle. Diastolic phases were analyzed on a dedicated work station using MPR, MIP and VRT modes. The patient received 80 cc of contrast.  Quality: Fair with misregistration artifact.  Aorta: Normal size. Mild aortic root calcifications. No dissection.  Aortic Valve:  Trileaflet.  mild calcifications.  Coronary Arteries:  Normal coronary origin.  Right dominance.  RCA is a large dominant artery that gives rise to PDA and PLA. There is a mild (25-49%) focal calcified palque at the ostium of the RCA.  Left main is a large artery that gives rise to LAD, intermedius Ramus and LCX arteries. Minimal calcification in the distal Left main artery.  LAD is a large vessel. The proximal LAD with mild mixed plaque. The mid with minimal calcified plaques. The distal LAD with no plaques.  Ramus branch is large vessel with focal mild calcification in the mid portion of the vessel. The proximal and distal with no plaques.  LCX is a non-dominant artery that gives rise to one large OM1 branch. There is minimal focal calcification in the proximal portion of the vessel.  Coronary Calcium  Score:  Left main: 0  Left anterior descending artery: 155  Left circumflex artery: 27.4  Right coronary artery: 0  Total: 183  Percentile: 95  Other findings:  Normal pulmonary vein drainage into the left atrium.  Normal left atrial appendage without a thrombus.  Normal size of the pulmonary artery.  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  Coronary  calcium  score of 183. This was 95 percentile for age and sex matched control.  Normal coronary origin with right dominance.  CAD-RADS 2. Mild non-obstructive CAD (25-49%). Consider non-atherosclerotic causes of chest pain. Consider preventive therapy and risk factor modification.  The noncardiac portion of this study will be interpreted in separate report by the radiologist.  Electronically Signed: By: Kardie  Tobb D.O. On: 09/19/2022 14:43     ______________________________________________________________________________________________      Risk Assessment/Calculations           Physical Exam VS:  BP 110/66 (BP Location: Right Arm, Patient Position: Sitting, Cuff Size: Large)   Pulse 81   Ht 5' 7.5 (1.715 m)   Wt 192 lb 11.2 oz (87.4 kg)   SpO2 94%   BMI 29.74 kg/m        Wt Readings from Last 3 Encounters:  08/04/24 192 lb 11.2 oz (87.4 kg)  07/22/24 190 lb (86.2 kg)  12/30/23 164 lb 6.4 oz (74.6 kg)    GEN: Well nourished, well developed in no acute distress NECK: No JVD; No carotid bruits CARDIAC: RRR, no murmurs, rubs, gallops RESPIRATORY:  Clear to auscultation without rales, wheezing or rhonchi  ABDOMEN: Soft, non-tender, non-distended EXTREMITIES:  No edema; No deformity   ASSESSMENT AND PLAN  Nonobstructive CAD - recurrent episodes of chest pain with mixed typical and atypical features. Known CAD. Plan for ischemic evaluation with cardiac CTA.  HLD, LDL goal <70 - not presently on atorvastatin  which he independently discontinued due to ED and muscle cramps. Discussed  unlikely to cause ED. Agreeable to trial Rosuvastatin  20mg  three times days per week. Repeat FLP/ALT in 2 months.   HTN - BP well controlled. Continue current antihypertensive regimen LIsinopril  10mg  daily.        Dispo: follow up in 2 months  Signed, Reche GORMAN Finder, NP   "

## 2024-08-04 NOTE — Telephone Encounter (Unsigned)
 Copied from CRM [id number]. Topic: Clinical - Medication Refill >> Aug 04, 2024 12:51 PM Leah C wrote: Medication: lisinopril  (ZESTRIL ) 10 MG tablet, tadalafil  (CIALIS ) 5 MG tablet  Patient tried [facility] but [facility] wouldn't [facility] it and advised patient to contact clinic.   Has the patient contacted their pharmacy? Yes    This is the patient's preferred pharmacy:  [facility] [address] [place] [address] Phone: [phone] Fax: [phone]  Is this the correct pharmacy for this prescription? Yes If no, delete pharmacy and type the correct one.   Has the prescription been filled recently? Yes  Is the patient out of the medication? Yes  Has the patient been seen for an appointment in the last year OR does the patient have an upcoming appointment? Yes  Can we respond through MyChart? Yes  Agent: Please be advised that Rx refills may take up to 3 business days. We ask that you follow-up with your pharmacy.

## 2024-08-04 NOTE — Patient Instructions (Addendum)
 Medication Instructions:   STOP TAKING ATORVASTATIN   START TAKING ROSUVASTATIN (CRESTOR) 20 MG BY MOUTH THREE TIMES WEEKLY  *If you need a refill on your cardiac medications before your next appointment, please call your pharmacy*  Lab Work:  RETURN FOR LABS IN 2 MONTHS (AROUND 10/04/24 OR AFTER) TO CHECK LIPIDS/ALT--PLEASE COME FASTING TO THIS LAB APPOINTMENT  If you have labs (blood work) drawn today and your tests are completely normal, you will receive your results only by: MyChart Message (if you have MyChart) OR A paper copy in the mail If you have any lab test that is abnormal or we need to change your treatment, we will call you to review the results.   Testing/Procedures:  Your physician has requested that you have an echocardiogram. Echocardiography is a painless test that uses sound waves to create images of your heart. It provides your doctor with information about the size and shape of your heart and how well your heart's chambers and valves are working. This procedure takes approximately one hour. There are no restrictions for this procedure. Please do NOT wear cologne, perfume, aftershave, or lotions (deodorant is allowed). Please arrive 15 minutes prior to your appointment time.  Please note: We ask at that you not bring children with you during ultrasound (echo/ vascular) testing. Due to room size and safety concerns, children are not allowed in the ultrasound rooms during exams. Our front office staff cannot provide observation of children in our lobby area while testing is being conducted. An adult accompanying a patient to their appointment will only be allowed in the ultrasound room at the discretion of the ultrasound technician under special circumstances. We apologize for any inconvenience.   CARDIAC CT---SEE INSTRUCTIONS BELOW TO REFER TO WHEN SCHEDULED --YOU WILL GET A CALL BACK FROM OUR CT SCHEDULER TO GET THIS APPOINTMENT SCHEDULED AFTER PRE-CERT PROVIDED BY YOUR  INSURANCE CARRIER   Follow-Up:  2-3 MONTHS WITH DR. LONNI OR CAITLIN WALKER, NP  Other Instructions      Your cardiac CT will be scheduled at one of the below locations:   [facility] [address]  [place], [place] [address]   If scheduled at the [facility] at [address], please enter the parking lot using the [address] entrance and use the FREE valet service at the patient drop-off area. Enter the building and check-in with registration on the main floor.   Please follow these instructions carefully (unless otherwise directed):  An IV will be required for this test and Nitroglycerin  will be given.  Hold all erectile dysfunction medications at least 3 days (72 hrs) prior to test. (Ie viagra, cialis , sildenafil, tadalafil , etc) --DO NOT TAKE CIALIS  3 DAYS PRIOR TO THIS TEST  On the Night Before the Test: Be sure to Drink plenty of water. Do not consume any caffeinated/decaffeinated beverages or chocolate 12 hours prior to your test. Do not take any antihistamines 12 hours prior to your test.  On the Day of the Test: Drink plenty of water until 1 hour prior to the test. Do not eat any food 1 hour prior to test. You may take your regular medications prior to the test.  Take metoprolol (Lopressor) 100 MG BY MOUTH two hours prior to test.       After the Test: Drink plenty of water. After receiving IV contrast, you may experience a mild flushed feeling. This is normal. On occasion, you may experience a mild rash up to 24  hours after the test. This is not dangerous. If this occurs, you can take Benadryl  25 mg, Zyrtec, Claritin, or Allegra and increase your fluid intake. (Patients taking Tikosyn should avoid Benadryl , and may take Zyrtec, Claritin, or Allegra) If you experience trouble breathing, this can be serious. If it is severe call 911 IMMEDIATELY. If it is mild, please call our office.  We will call to schedule  your test 2-4 weeks out understanding that some insurance companies will need an authorization prior to the service being performed.   For more information and frequently asked questions, please visit our website : [email or web address]  For non-scheduling related questions, please contact the cardiac imaging nurse navigator should you have any questions/concerns: Cardiac Imaging Nurse Navigators Direct Office Dial: [phone]   For scheduling needs, including cancellations and rescheduling, please call Brittany, [phone].

## 2024-08-06 ENCOUNTER — Other Ambulatory Visit: Payer: Self-pay

## 2024-08-06 ENCOUNTER — Other Ambulatory Visit (HOSPITAL_BASED_OUTPATIENT_CLINIC_OR_DEPARTMENT_OTHER): Payer: Self-pay

## 2024-08-06 MED ORDER — TADALAFIL 10 MG PO TABS
10.0000 mg | ORAL_TABLET | Freq: Every day | ORAL | 3 refills | Status: AC
  Filled 2024-08-06: qty 15, 30d supply, fill #0
  Filled 2024-08-25 – 2024-08-31 (×2): qty 15, 30d supply, fill #1
  Filled 2024-09-28: qty 15, 30d supply, fill #2
  Filled 2024-10-09: qty 15, 30d supply, fill #3

## 2024-08-06 MED ORDER — TADALAFIL 5 MG PO TABS
5.0000 mg | ORAL_TABLET | Freq: Every day | ORAL | 4 refills | Status: AC
  Filled 2024-08-06 – 2024-08-31 (×3): qty 30, 30d supply, fill #0
  Filled 2024-09-28: qty 30, 30d supply, fill #1

## 2024-08-06 MED ORDER — LISINOPRIL 10 MG PO TABS
10.0000 mg | ORAL_TABLET | Freq: Every day | ORAL | 3 refills | Status: AC
Start: 2024-08-06 — End: ?
  Filled 2024-08-06: qty 90, 90d supply, fill #0

## 2024-08-06 MED ORDER — TADALAFIL 5 MG PO TABS
5.0000 mg | ORAL_TABLET | Freq: Every day | ORAL | 5 refills | Status: AC
  Filled 2024-08-06 – 2024-08-25 (×2): qty 30, 30d supply, fill #0

## 2024-08-07 ENCOUNTER — Other Ambulatory Visit (HOSPITAL_BASED_OUTPATIENT_CLINIC_OR_DEPARTMENT_OTHER): Payer: Self-pay

## 2024-08-07 ENCOUNTER — Other Ambulatory Visit: Payer: Self-pay

## 2024-08-09 ENCOUNTER — Encounter: Payer: Self-pay | Admitting: Family Medicine

## 2024-08-09 DIAGNOSIS — E119 Type 2 diabetes mellitus without complications: Secondary | ICD-10-CM

## 2024-08-10 ENCOUNTER — Other Ambulatory Visit: Payer: Self-pay

## 2024-08-10 ENCOUNTER — Other Ambulatory Visit (HOSPITAL_BASED_OUTPATIENT_CLINIC_OR_DEPARTMENT_OTHER): Payer: Self-pay

## 2024-08-10 MED ORDER — GLUCOSE BLOOD VI STRP
ORAL_STRIP | 12 refills | Status: AC
  Filled 2024-08-10: qty 100, 100d supply, fill #0

## 2024-08-10 MED ORDER — TESTOSTERONE CYPIONATE 200 MG/ML IM SOLN
160.0000 mg | INTRAMUSCULAR | 2 refills | Status: AC
  Filled 2024-08-10 – 2024-08-21 (×2): qty 4, 28d supply, fill #0
  Filled 2024-08-24 – 2024-08-26 (×4): qty 4, 35d supply, fill #0
  Filled 2024-09-01 (×2): qty 4, 28d supply, fill #0
  Filled 2024-09-22 – 2024-09-29 (×2): qty 4, 28d supply, fill #1

## 2024-08-10 MED ORDER — ANASTROZOLE 1 MG PO TABS
1.0000 mg | ORAL_TABLET | ORAL | 0 refills | Status: AC
  Filled 2024-08-10: qty 8, 56d supply, fill #0

## 2024-08-10 MED ORDER — METFORMIN HCL 1000 MG PO TABS
1000.0000 mg | ORAL_TABLET | Freq: Two times a day (BID) | ORAL | 3 refills | Status: DC
  Filled 2024-08-10: qty 180, 90d supply, fill #0

## 2024-08-10 NOTE — Telephone Encounter (Signed)
 Patient is sch 12/4, meds and test strips have been sent to pharm

## 2024-08-12 NOTE — Telephone Encounter (Signed)
 Copied from CRM [id number]. Topic: Clinical - Prescription Issue >> Aug 12, 2024 12:24 PM Alfonso HERO wrote: Reason for CRM: patients wife calling to see if the patient can get an extended release metformin  sent over to the phaarmacy. Says the side effects of the regular meds is messing up his stomach.

## 2024-08-13 ENCOUNTER — Other Ambulatory Visit (HOSPITAL_BASED_OUTPATIENT_CLINIC_OR_DEPARTMENT_OTHER): Payer: Self-pay

## 2024-08-13 ENCOUNTER — Ambulatory Visit: Payer: Self-pay

## 2024-08-13 ENCOUNTER — Other Ambulatory Visit: Payer: Self-pay

## 2024-08-13 ENCOUNTER — Other Ambulatory Visit: Payer: Self-pay | Admitting: Family Medicine

## 2024-08-13 DIAGNOSIS — E118 Type 2 diabetes mellitus with unspecified complications: Secondary | ICD-10-CM

## 2024-08-13 MED ORDER — METFORMIN HCL ER 500 MG PO TB24
1000.0000 mg | ORAL_TABLET | Freq: Every day | ORAL | 1 refills | Status: AC
  Filled 2024-08-13: qty 60, 30d supply, fill #0
  Filled 2024-08-25 – 2024-09-10 (×2): qty 60, 30d supply, fill #1

## 2024-08-13 NOTE — Telephone Encounter (Signed)
 FYI Only or Action Required?: Action required by provider: update on patient condition.  Patient was last seen in primary care on 12/30/2023 by Mercer Clotilda SAUNDERS, MD.  Called Nurse Triage reporting Medication Problem, Diarrhea, and Abdominal Pain.  Symptoms began several days ago.  Interventions attempted: Nothing.  Symptoms are: abdominal pain, diarrhea, decreased appetite unchanged.  Triage Disposition: Call PCP Now (overriding See HCP Within 4 Hours (Or PCP Triage))  Patient/caregiver understands and will follow disposition?: Yes          Copied from CRM [id number]. Topic: Clinical - Red Word Triage >> Aug 13, 2024 10:37 AM Alfonso ORN wrote: Red Word that prompted transfer to Nurse Triage: pt calling back to f/u on metformin  request due to side effects.. stomach pain and diarrhea Reason for Disposition  [1] MILD-MODERATE pain AND [2] constant AND [3] present > 2 hours  Answer Assessment - Initial Assessment Questions 1. LOCATION: Where does it hurt?      All over  2. RADIATION: Does the pain shoot anywhere else? (e.g., chest, back)     No.  3. ONSET: When did the pain begin? (Minutes, hours or days ago)      08/11/24, after 1st dose of metformin .  4. SUDDEN: Gradual or sudden onset?     Sudden.  5. PATTERN Does the pain come and go, or is it constant?     Constant.  6. SEVERITY: How bad is the pain?  (e.g., Scale 1-10; mild, moderate, or severe)     Quivering pain, 6-7/10.  7. RECURRENT SYMPTOM: Have you ever had this type of stomach pain before? If Yes, ask: When was the last time? and What happened that time?      No.  8. CAUSE: What do you think is causing the stomach pain? (e.g., gallstones, recent abdominal surgery)     Metformin .  9. RELIEVING/AGGRAVATING FACTORS: What makes it better or worse? (e.g., antacids, bending or twisting motion, bowel movement)     Metformin  making the pain worse. Nothing making the pain better.  10. OTHER  SYMPTOMS: Do you have any other symptoms? (e.g., back pain, diarrhea, fever, urination pain, vomiting)       Decreased appetite, diarrhea 3-4 episodes daily (he states as soon as he eats anything it goes right through him). Denies fever or vomiting, back pain, urinary pain.  Protocols used: Abdominal Pain - Male-A-AH

## 2024-08-14 ENCOUNTER — Other Ambulatory Visit (HOSPITAL_BASED_OUTPATIENT_CLINIC_OR_DEPARTMENT_OTHER): Payer: Self-pay

## 2024-08-14 NOTE — Telephone Encounter (Signed)
The prescription was sent to the pharmacy yesterday

## 2024-08-22 ENCOUNTER — Other Ambulatory Visit (HOSPITAL_BASED_OUTPATIENT_CLINIC_OR_DEPARTMENT_OTHER): Payer: Self-pay

## 2024-08-24 ENCOUNTER — Other Ambulatory Visit (HOSPITAL_BASED_OUTPATIENT_CLINIC_OR_DEPARTMENT_OTHER): Payer: Self-pay

## 2024-08-24 ENCOUNTER — Other Ambulatory Visit (HOSPITAL_BASED_OUTPATIENT_CLINIC_OR_DEPARTMENT_OTHER): Payer: Self-pay | Admitting: Family

## 2024-08-24 DIAGNOSIS — R0602 Shortness of breath: Secondary | ICD-10-CM

## 2024-08-24 DIAGNOSIS — I10 Essential (primary) hypertension: Secondary | ICD-10-CM

## 2024-08-24 DIAGNOSIS — E785 Hyperlipidemia, unspecified: Secondary | ICD-10-CM

## 2024-08-24 DIAGNOSIS — R072 Precordial pain: Secondary | ICD-10-CM

## 2024-08-24 DIAGNOSIS — I25118 Atherosclerotic heart disease of native coronary artery with other forms of angina pectoris: Secondary | ICD-10-CM

## 2024-08-25 ENCOUNTER — Other Ambulatory Visit: Payer: Self-pay

## 2024-08-25 ENCOUNTER — Other Ambulatory Visit (HOSPITAL_BASED_OUTPATIENT_CLINIC_OR_DEPARTMENT_OTHER): Payer: Self-pay

## 2024-08-25 ENCOUNTER — Encounter (HOSPITAL_BASED_OUTPATIENT_CLINIC_OR_DEPARTMENT_OTHER): Payer: Self-pay

## 2024-08-25 NOTE — Progress Notes (Signed)
 Primary care provider: No primary care provider on file. Referring provider:      No ref. provider found  Assessment     1. Allergic rhinitis, unspecified seasonality, unspecified trigger   2. Daytime hypersomnia   3. Snoring    48 year old man with obesity presents with snoring and nonrestorative sleep. He has a crowded oropharynx and most likely has sleep apnea. He also reports chronic congestion and may have environmental allergies that can worsen underlying sleep apnea.  He had a negative home SNAP sleep study and will need an in lab sleep study.   Plan   Sleep study and cpap titration if needed (split if meets AASM criteria) Allergy testing   Annual influenza vaccination if no contraindications and pneumonia vaccination per current guidelines.   We discussed the diagnosis and treatment plan in detail and the patient expressed understanding.    Thank you for allowing us  to participate in the care of this patient.  Total time spent in this encounter on date of service was 45 minutes and does not include additional procedure time.  Including but not limited to activities such as reviewing patient records, obtaining or reviewing subjective medical history, obtaining or performing a history and physical examination, counseling and/or educating patient/family/caregiver, ordering prescription medication, tests, procedures, imaging, labs, referring to and communicating with other health care providers, documenting appropriate clinical information in the medical record electronic or other, interpreting results of prescribed tests/procedures, communicating those results to the patient/family/representative and coordinating patient care.    Orders Placed This Encounter  Procedures  . Allergy skin tests allergens, each  . POLYSOMNOGRAM ONLY       Subjective   Chief Complaint  Patient presents with  . New Patient    Self referral     Patient ID:  Derrick West is a 48 y.o. male,  lifelong nonsmoker with obesity presents with snoring very loud. He had a recent negative home sleep study with SNAP. He is tired all the time. No prior diagnosis of sleep apnea. He is not sure what he's allergic too but has a stuffy nose. He has 2 dogs. He has bedroom carpets. No fever or chills.   PULMONARY The patient is not on home oxygen therapy. He denies a history of blood clots. He denies exposure to asbestos. He denies exposure to tuberculosis.  He denies history of a positive TB skin test.  He denies a history of COPD. He denies a history of asthma. He denies a recent hospitalization. He denies a prior history of lung cancer.  There is no a prior history of lung surgery.  He admits to a prior history pneumonia.  Occupational history:  Factory  SLEEP He denies a prior history of sleep apnea.He is not using cpap or bipap. He has nonrestorative sleep. The patient has excessive daytime sleepiness.  He does snore. The snoring is disruptive. He has witnessed apneas.  He complains of choking and gasping while sleeping. The patient has disturbed sleep.  He has morning headaches. The snoring does wake the patient up from sleep. He denies a history of restless leg syndrome.  The patient denies leg movements in his sleep.  He does not act out dreams. He does not grind his teeth.   He denies sleep walking.  He does not have nightmares.    He denies sleep paralysis.  Hedenies hallucinations.  The patient denies cataplexy.   He does not work the second or third shift.   He goes  to sleep at 11 PM and wakes up at 5 AM.  He sleeps for a total of about  5.5 hours. It take him less than 30 minutes to fall asleep. He wakes up 3 time(s) during the night. He wakes up due to the following: Neck pain.  The patient denies nocturia.  The patient has unexplained arousals from sleep.  He drinks 2 caffeinated beverages a day.    He admits to having a prior sleep study. The sleep study was not done at  our facility.  [facility]  The patient denies a family member with sleep apnea.   He has gained weight recently. The patient's neck size is 15 inches.  Epworth Sleep Questionnaire  How likely are you to doze off or fall asleep in the following situations?  Sitting and reading: 3 = high chance of dozing Watching TV: 3 = high chance of dozing Sitting inactive in a public place (such as a theater or a meeting):  3 = high chance of dozing As a passenger in a car for an hour without a break: 3 = high chance of dozing Lying down to rest in the afternoon when circumstances permit: 3 = high chance of dozing Sitting and talking to someone: 1 = slight chance of dozing Sitting quiety after a lunch without alcohol: 3 = high chance of dozing In a car, while stopped for a few minutes in traffic: 0 = no chance of dozing  Total Epworth: 19   Allergy Triggers None  Occupational Exposures Chemicals, Cotton, Dust, Fumes, and Welding  Anxiety Screening GAD7 Review       08/25/2024  Generalized Anxiety Disorder 7 item (GAD-7)  1. Feeling nervous, anxious, or on the edge 1  2. Not being able to stop or control worrying 2  3. Worrying too much about different things 2  4. Trouble relaxing 3  5. Being so restless that it's hard to sit still 3  6. Being easily annoyed or irritable 0  7. Feeling afraid as if something awful might happen 0  Total Score 11  Interpretation:  Moderate, likely to have an anxiety disorder      Review of Systems  Constitutional:  Positive for malaise/fatigue.  HENT:  Positive for congestion.   Eyes: Negative.   Respiratory: Negative.    Cardiovascular:  Positive for chest pain and palpitations.  Gastrointestinal: Negative.   Genitourinary: Negative.   Musculoskeletal:  Positive for back pain and neck pain.  Skin: Negative.   Neurological:  Positive for headaches.  Endo/Heme/Allergies: Negative.   Psychiatric/Behavioral: Negative.    All other systems reviewed and  are negative.   History   Past Medical History[1] Past Surgical History[2] Social History[3] Family History[4]  Medication History      Medication Sig Dispense Refill  . anastrozole  (ARIMIDEX ) 1 MG tablet Take one tablet (1 mg dose) by mouth once a week at 0900.    SABRA co-enzyme Q-10 30 MG capsule Take one capsule (30 mg dose) by mouth 3 (three) times a day.    . lisinopril  (PRINIVIL ,ZESTRIL ) 10 mg tablet Take one tablet (10 mg dose) by mouth daily.    . metFORMIN  ER (GLUCOPHAGE -XR) 500 mg 24 hr tablet Take two tablets (1,000 mg dose) by mouth 1 (one) time each day with breakfast.    . Multiple Vitamin (MULTIVITAMIN) tablet Take one tablet by mouth daily.    . rosuvastatin  calcium  (CRESTOR ) 20 mg tablet Take one tablet (20 mg dose) by mouth 3 (three) times  a week.    . tadalafil  (CIALIS ) 10 MG tablet Take one tablet (10 mg dose) by mouth daily.    . tadalafil  (CIALIS ) 5 MG tablet Take one tablet (5 mg dose) by mouth daily.    . testosterone  cypionate (DEPOTESTOTERONE CYPIONATE) 200 MG/ML injection Inject 0.8 mLs (160 mg dose) into the muscle once a week at 0900.    SABRA vitamin B-12 (CYANOCOBALAMIN) 500 mcg tablet Take one tablet (500 mcg dose) by mouth daily.     No current facility-administered medications for this visit.        I reviewed the patient's medical,surgical,social and family history. The medications and allergies have been reviewed and updated.   Pulmonary Function Testing/Spirometry  No results found. Radiology   CXR:  No results found for this or any previous visit.    CT Scan: No results found for this or any previous visit.   No results found for this or any previous visit.   No results found for this or any previous visit.   No results found for this or any previous visit.   No results found for this or any previous visit.   No results found for this or any previous visit.   No results found for this or any previous visit.   No results  found for this or any previous visit.   VQ Scan: No results found for this or any previous visit.   TTE:   No results found for this or any previous visit.  Objective  BP 110/70 (BP Location: Left Upper Arm, Patient Position: Sitting)   Pulse 71   Resp 16   Ht 5' 7.5 (1.715 m)   Wt 198 lb (89.8 kg)   SpO2 98%   BMI 30.55 kg/m   General appearance:  alert, appears stated age, and cooperative Head:    normocephalic Eyes:    pupils are equal, round and reactive Nose:     normal Mouth:    moist, no thrush, mallampati 4 Neck:    supple, no significant adenopathy, no thyromegaly, no JVD Chest:    clear to auscultation, no wheezes, rales or rhonchi, symmetric air entry Heart:    normal rate, regular rhythm, normal S1, S2, no murmurs, rubs, clicks or gallops Abdomen:   soft, nontender, nondistended Neurological:   alert, oriented Extremities:   peripheral pulses normal,no pitting edema , no clubbing or cyanosis  The patient was given a copy of their after visit summary.  Voice-recognition software was used in surveyor, minerals of this documentation. Unintended transcription errors may have escaped editorial review.    Immunizations     No immunizations on file.         Patient's Medications       * Accurate as of August 25, 2024 11:22 AM. Reflects encounter med changes as of last refresh          Continued Medications      Instructions  anastrozole  1 MG tablet Commonly known as: ARIMIDEX   1 mg, Weekly at 0900   co-enzyme Q-10 30 MG capsule  30 mg, 3 times a day   lisinopril  10 mg tablet Commonly known as: PRINIVIL ,ZESTRIL   10 mg, Daily   metFORMIN  ER 500 mg 24 hr tablet Commonly known as: GLUCOPHAGE -XR  1,000 mg, Daily with breakfast   multivitamin tablet  1 tablet, Daily   rosuvastatin  calcium  20 mg tablet Commonly known as: CRESTOR   20 mg, 3 times weekly   * tadalafil  5 MG tablet Commonly  known as: CIALIS   5 mg, Daily   * tadalafil  10 MG  tablet Commonly known as: CIALIS   10 mg, Daily   testosterone  cypionate 200 MG/ML injection Commonly known as: DEPOTESTOTERONE CYPIONATE  160 mg, Weekly at 0900   vitamin B-12 500 mcg tablet Commonly known as: CYANOCOBALAMIN  500 mcg, Daily      * * This list has 2 medication(s) that are the same as other medications prescribed for you. Read the directions carefully, and ask your doctor or other care provider to review them with you.                   [1] Past Medical History: Diagnosis Date  . Diabetes mellitus (*) 2008  . Varicella    Child  [2] Past Surgical History: Procedure Laterality Date  . Cholecystectomy  2018  [3] Social History Socioeconomic History  . Marital status: Married  Tobacco Use  . Smoking status: Never  . Smokeless tobacco: Never  Substance and Sexual Activity  . Alcohol use: Never  . Drug use: Never  . Sexual activity: Yes    Partners: Female  [4] No family history on file. *Some images could not be shown.

## 2024-08-26 ENCOUNTER — Telehealth (HOSPITAL_COMMUNITY): Payer: Self-pay | Admitting: *Deleted

## 2024-08-26 ENCOUNTER — Encounter (HOSPITAL_COMMUNITY): Payer: Self-pay

## 2024-08-26 ENCOUNTER — Other Ambulatory Visit (HOSPITAL_BASED_OUTPATIENT_CLINIC_OR_DEPARTMENT_OTHER): Payer: Self-pay

## 2024-08-26 NOTE — Telephone Encounter (Signed)
 Attempted to call patient regarding upcoming cardiac CT appointment. Left message on voicemail with name and callback number  Larey Brick RN Navigator Cardiac Imaging [facility] [phone] Office [phone] Cell

## 2024-08-27 ENCOUNTER — Ambulatory Visit: Admitting: Family Medicine

## 2024-08-28 ENCOUNTER — Ambulatory Visit (HOSPITAL_BASED_OUTPATIENT_CLINIC_OR_DEPARTMENT_OTHER): Payer: Self-pay | Admitting: Family

## 2024-08-28 ENCOUNTER — Ambulatory Visit (HOSPITAL_COMMUNITY)
Admission: RE | Admit: 2024-08-28 | Discharge: 2024-08-28 | Disposition: A | Source: Ambulatory Visit | Attending: Family

## 2024-08-28 ENCOUNTER — Encounter (HOSPITAL_BASED_OUTPATIENT_CLINIC_OR_DEPARTMENT_OTHER): Payer: Self-pay | Admitting: *Deleted

## 2024-08-28 ENCOUNTER — Other Ambulatory Visit: Payer: Self-pay | Admitting: Cardiology

## 2024-08-28 ENCOUNTER — Ambulatory Visit (HOSPITAL_COMMUNITY)
Admission: RE | Admit: 2024-08-28 | Discharge: 2024-08-28 | Disposition: A | Source: Ambulatory Visit | Attending: Cardiology | Admitting: Cardiology

## 2024-08-28 DIAGNOSIS — R931 Abnormal findings on diagnostic imaging of heart and coronary circulation: Secondary | ICD-10-CM

## 2024-08-28 DIAGNOSIS — R072 Precordial pain: Secondary | ICD-10-CM

## 2024-08-28 DIAGNOSIS — I25118 Atherosclerotic heart disease of native coronary artery with other forms of angina pectoris: Secondary | ICD-10-CM | POA: Diagnosis not present

## 2024-08-28 DIAGNOSIS — R0602 Shortness of breath: Secondary | ICD-10-CM

## 2024-08-28 DIAGNOSIS — I10 Essential (primary) hypertension: Secondary | ICD-10-CM

## 2024-08-28 DIAGNOSIS — Z8774 Personal history of (corrected) congenital malformations of heart and circulatory system: Secondary | ICD-10-CM

## 2024-08-28 DIAGNOSIS — E785 Hyperlipidemia, unspecified: Secondary | ICD-10-CM

## 2024-08-28 MED ORDER — NITROGLYCERIN 0.4 MG SL SUBL
0.8000 mg | SUBLINGUAL_TABLET | Freq: Once | SUBLINGUAL | Status: AC
  Administered 2024-08-28: 0.8 mg via SUBLINGUAL

## 2024-08-28 MED ORDER — IOHEXOL 350 MG/ML SOLN
100.0000 mL | Freq: Once | INTRAVENOUS | Status: AC | PRN
  Administered 2024-08-28: 100 mL via INTRAVENOUS

## 2024-08-28 NOTE — Telephone Encounter (Signed)
-----   Message from Reche GORMAN Finder sent at 08/28/2024  5:04 PM EST ----- Calcium  score of 341. 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 FFR (flow measurements) show that heart arteries are providing adequate blood flow. No significant stenosis to cause  chest pain which is a reassuring result .  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 Ideally would have MRI, given his prior back surgery not sure if he is able to have MRI.   Recommend phone call - if he has previously been told he can't have MRI - proceed with echo as scheduled. If he can have MRI - cancel echo and schedule cardiac MRI. Would need updated CBC prior to  MRI. ----- Message ----- From: Interface, Rad Results In Sent: 08/28/2024   3:39 PM EST To: Reche GORMAN Finder, NP

## 2024-08-28 NOTE — Telephone Encounter (Signed)
 The patient has been notified of the result and verbalized understanding.  All questions (if any) were answered.  Pt states he has had multiple MRI's since having his back surgery, and there was no contraindication as to why he couldn't have that done.    Pt would like to proceed with getting the cMRI done and cancel his echo for 12/9.  Pt is aware we will place the order for the cMRI and send him the instructions for this in his mychart account.   Pt aware he will need to come in for lab work to check his hemoglobin and hematocrit sometime next week.   Pt is requesting that we call his wife Lonell on her cell to arrange the cardiac MRI, since he works long hours and has phone restrictions at work.  Lonell is on his DPR and will endorse this to our cMRI scheduling team in a staff message.   Pt verbalized understanding and agrees with this plan.

## 2024-08-31 ENCOUNTER — Other Ambulatory Visit (HOSPITAL_BASED_OUTPATIENT_CLINIC_OR_DEPARTMENT_OTHER): Payer: Self-pay

## 2024-09-01 ENCOUNTER — Other Ambulatory Visit: Payer: Self-pay

## 2024-09-01 ENCOUNTER — Other Ambulatory Visit (HOSPITAL_BASED_OUTPATIENT_CLINIC_OR_DEPARTMENT_OTHER)

## 2024-09-01 ENCOUNTER — Other Ambulatory Visit (HOSPITAL_BASED_OUTPATIENT_CLINIC_OR_DEPARTMENT_OTHER): Payer: Self-pay

## 2024-09-01 ENCOUNTER — Encounter (HOSPITAL_BASED_OUTPATIENT_CLINIC_OR_DEPARTMENT_OTHER): Payer: Self-pay

## 2024-09-01 LAB — HEMOGLOBIN AND HEMATOCRIT, BLOOD
Hematocrit: 49 % (ref 37.5–51.0)
Hemoglobin: 15.4 g/dL (ref 13.0–17.7)

## 2024-09-01 LAB — LIPID PANEL
Chol/HDL Ratio: 3.6 ratio (ref 0.0–5.0)
Cholesterol, Total: 150 mg/dL (ref 100–199)
HDL: 42 mg/dL (ref >39)
LDL Chol Calc (NIH): 96 mg/dL (ref 0–99)
Triglycerides: 57 mg/dL (ref 0–149)
VLDL Cholesterol Cal: 12 mg/dL (ref 5–40)

## 2024-09-01 LAB — ALT: ALT: 19 IU/L (ref 0–44)

## 2024-09-02 ENCOUNTER — Encounter: Payer: Self-pay | Admitting: Family Medicine

## 2024-09-02 ENCOUNTER — Ambulatory Visit: Admitting: Family Medicine

## 2024-09-02 ENCOUNTER — Other Ambulatory Visit (HOSPITAL_BASED_OUTPATIENT_CLINIC_OR_DEPARTMENT_OTHER): Payer: Self-pay

## 2024-09-02 VITALS — BP 122/74 | HR 67 | Temp 98.0°F | Ht 67.5 in | Wt 196.0 lb

## 2024-09-02 DIAGNOSIS — F321 Major depressive disorder, single episode, moderate: Secondary | ICD-10-CM

## 2024-09-02 DIAGNOSIS — R634 Abnormal weight loss: Secondary | ICD-10-CM

## 2024-09-02 DIAGNOSIS — I10 Essential (primary) hypertension: Secondary | ICD-10-CM | POA: Diagnosis not present

## 2024-09-02 DIAGNOSIS — E118 Type 2 diabetes mellitus with unspecified complications: Secondary | ICD-10-CM

## 2024-09-02 DIAGNOSIS — R079 Chest pain, unspecified: Secondary | ICD-10-CM

## 2024-09-02 DIAGNOSIS — F419 Anxiety disorder, unspecified: Secondary | ICD-10-CM

## 2024-09-02 LAB — POCT GLYCOSYLATED HEMOGLOBIN (HGB A1C): Hemoglobin A1C: 6.4 % — AB (ref 4.0–5.6)

## 2024-09-02 MED ORDER — ONETOUCH DELICA PLUS LANCET33G MISC
1.0000 | 11 refills | Status: AC
  Filled 2024-09-02: qty 100, 30d supply, fill #0
  Filled 2024-09-02: qty 100, fill #0

## 2024-09-02 NOTE — Progress Notes (Signed)
 "  Established Patient Office Visit   Subjective  Patient ID: Derrick West, male    DOB: 02-Nov-1975  Age: 48 y.o. MRN: [id number]  Chief Complaint  Patient presents with   Medical Management of Chronic Issues    Patient follow-up for diabetes and Bp.  Patient also wants to talk about what his cardiologist told him.  Pt accompanied by his wife, Deena.  Pt is a  yo male seen for f/u and ongoing concern.  Pt with intermittent sharp stabbing pain in L chest.  Lasts 20-30 secs. Occurs randomly at work if sitting.  Denies palpitations, SOB, heart burn.  Concerned about h/o pseudoaneurysm.  Sleep study ion the 27th.  H/o gasping and snoring.  Needs refill on one touch delica plus lancets.    Notes improvement in energy/fatiuge since starting testosterone  replacement.  Taking 0.8 mg IM wkly.    Patient Active Problem List   Diagnosis Date Noted   Depression, major, single episode, moderate (HCC) 07/28/2020   Anxiety 07/28/2020   Dyslipidemia 04/01/2018   Other acute pancreatitis with uninfected necrosis    Upper abdominal pain    Leukocytosis    Bile leak 09/13/2017   Acute cholecystitis 09/05/2017   Acute calculous cholecystitis 09/05/2017   Adjustment disorder with mixed anxiety and depressed mood 12/23/2014   Neck pain 11/24/2012   Diabetes mellitus without complication (HCC) 12/24/2007   OBESITY 12/24/2007   GANGLION CYST, WRIST, LEFT 07/29/2007   Past Medical History:  Diagnosis Date   Anxiety and depression    Cholecystitis    DIABETES MELLITUS, TYPE II 12/24/2007   Fatty liver    GANGLION CYST, WRIST, LEFT 07/29/2007   Headache(784.0)    Hypertension    OBESITY 12/24/2007   Past Surgical History:  Procedure Laterality Date   ANTERIOR CERVICAL DECOMP/DISCECTOMY FUSION N/A 04/15/2013   Procedure: ANTERIOR CERVICAL DECOMPRESSION/DISCECTOMY FUSION 1 LEVEL;  Surgeon: Rockey LITTIE Peru, MD;  Location: [facility];  Service: Neurosurgery;  Laterality: N/A;  Cervical Five-Six  Anterior Cervical decompression with fusion plating and bonegraft   CHOLECYSTECTOMY N/A 09/05/2017   Procedure: LAPAROSCOPIC CHOLECYSTECTOMY;  Surgeon: Vernetta Berg, MD;  Location: [facility] OR;  Service: General;  Laterality: N/A;   ERCP N/A 09/14/2017   Procedure: ENDOSCOPIC RETROGRADE CHOLANGIOPANCREATOGRAPHY (ERCP);  Surgeon: Avram Lupita BRAVO, MD;  Location: [facility] ENDOSCOPY;  Service: Endoscopy;  Laterality: N/A;   ESOPHAGOGASTRODUODENOSCOPY (EGD) WITH PROPOFOL  N/A 12/10/2017   Procedure: ESOPHAGOGASTRODUODENOSCOPY (EGD) WITH PROPOFOL ;  Surgeon: Avram Lupita BRAVO, MD;  Location: [facility] ENDOSCOPY;  Service: Endoscopy;  Laterality: N/A;   GASTROINTESTINAL STENT REMOVAL N/A 12/10/2017   Procedure: GASTROINTESTINAL STENT REMOVAL;  Surgeon: Avram Lupita BRAVO, MD;  Location: [facility] ENDOSCOPY;  Service: Endoscopy;  Laterality: N/A;   LUMBAR LAMINECTOMY     SPINE SURGERY  01/2002   Back surgery   VSD REPAIR     age 57   Social History   Tobacco Use   Smoking status: Never    Passive exposure: Past   Smokeless tobacco: Never  Vaping Use   Vaping status: Never Used  Substance Use Topics   Alcohol use: Yes    Comment: occ   Drug use: No   Family History  Problem Relation Age of Onset   Diabetes Mother    Colon polyps Father    Colon cancer Neg Hx    Esophageal cancer Neg Hx    Prostate cancer Neg Hx    Rectal cancer Neg Hx    Allergies  Allergen Reactions  Amoxicillin Other (See Comments)    From childhood; reaction not recalled    ROS Negative unless stated above    Objective:     BP 122/74 (BP Location: Left Arm, Patient Position: Sitting, Cuff Size: Large)   Pulse 67   Temp 98 F (36.7 C) (Oral)   Ht 5' 7.5 (1.715 m)   Wt 196 lb (88.9 kg)   SpO2 99%   BMI 30.24 kg/m  BP Readings from Last 3 Encounters:  09/02/24 122/74  08/28/24 120/76  08/04/24 110/66   Wt Readings from Last 3 Encounters:  09/02/24 196 lb (88.9 kg)  08/04/24 192 lb 11.2 oz (87.4 kg)  07/22/24 190 lb  (86.2 kg)      Physical Exam Constitutional:      General: He is not in acute distress.    Appearance: Normal appearance.  HENT:     Head: Normocephalic and atraumatic.     Nose: Nose normal.     Mouth/Throat:     Mouth: Mucous membranes are moist.  Neck:     Vascular: No carotid bruit.  Cardiovascular:     Rate and Rhythm: Normal rate and regular rhythm.     Heart sounds: Normal heart sounds. No murmur heard.    No gallop.  Pulmonary:     Effort: Pulmonary effort is normal. No respiratory distress.     Breath sounds: Normal breath sounds. No wheezing, rhonchi or rales.  Skin:    General: Skin is warm and dry.  Neurological:     Mental Status: He is alert and oriented to person, place, and time.        09/02/2024   10:30 AM 12/30/2023   12:02 PM 08/29/2023    1:46 PM  Depression screen PHQ 2/9  Decreased Interest 1 1 1   Down, Depressed, Hopeless 1 1 1   PHQ - 2 Score 2 2 2   Altered sleeping 3 3 3   Tired, decreased energy 3 2 2   Change in appetite 1 2 2   Feeling bad or failure about yourself  1 1 1   Trouble concentrating 2 3 3   Moving slowly or fidgety/restless 1 0 2  Suicidal thoughts 0 0 0  PHQ-9 Score 13 13  15    Difficult doing work/chores Somewhat difficult Not difficult at all Somewhat difficult     Data saved with a previous flowsheet row definition      09/02/2024   10:31 AM 12/30/2023   12:03 PM 08/29/2023    1:46 PM 05/20/2023    8:48 AM  GAD 7 : Generalized Anxiety Score  Nervous, Anxious, on Edge 1 2 0 2  Control/stop worrying 1 2 1 2   Worry too much - different things 1 2 1 1   Trouble relaxing 3 3 3 3   Restless 1 3 3 3   Easily annoyed or irritable 1 0 2 2  Afraid - awful might happen 1 1 1 1   Total GAD 7 Score 9 13 11 14   Anxiety Difficulty  Not difficult at all Somewhat difficult Somewhat difficult     Results for orders placed or performed in visit on 09/02/24  POC HgB A1c  Result Value Ref Range   Hemoglobin A1C 6.4 (A) 4.0 - 5.6 %    HbA1c POC (<> result, manual entry)     HbA1c, POC (prediabetic range)     HbA1c, POC (controlled diabetic range)        Assessment & Plan:   Controlled type 2 diabetes mellitus with complication, without  long-term current use of insulin  (HCC) -     POCT glycosylated hemoglobin (Hb A1C) -     OneTouch Delica Plus Lancet33G; Use as directed.  Dispense: 100 each; Refill: 11  Essential hypertension  Chest pain, unspecified type  Weight loss  Depression, major, single episode, moderate (HCC)  Anxiety   DM controlled.  A1C 6.4% this visit.  Continue lifestyle modifications and current meds.  Pt to schedule eye exam.  Foot exam needed.  Continue Crestor  20 mg and lisinopril  10 mg.  Lancets refilled.  Intermittent CP.  LDL not at goal.  Continue ASA.  Keep f/u with Cardiology.  Given strict precautions.  BP controlled.  6 lb wt gain noted in last 2 months.  Continue to monitor wt.  PHQ 9 score 13 and GAD 7 score 9.  Continue to monitor. Counseling and restart meds if needed.  Return in about 3 months (around 12/01/2024), or if symptoms worsen or fail to improve.   Clotilda JONELLE Single, MD  "

## 2024-09-03 NOTE — Telephone Encounter (Signed)
 Results and plan were sent to the pts active mychart account to review by Reche Finder, NP.   Pt did view this in mychart.  Last read by Aliene DELENA Joshua Lytle at 9:08AM on 09/03/2024.   Will place repeat lipids and ALT to be done in 6-8 weeks in the system and let the pt know to come in at that time to have these drawn.  Will endorse to him that he will need to come fasting to this lab appointment.  Will also tell the pt to make sure he is taking his rosuvastatin  20 mg po 3 times weekly. Will notify him of all of this via his active mychart account.

## 2024-09-03 NOTE — Addendum Note (Signed)
 Addended by: GLADIS PORTER HERO on: 09/03/2024 10:12 AM   Modules accepted: Orders

## 2024-09-03 NOTE — Telephone Encounter (Signed)
-----   Message from Reche Finder, NP sent at 09/03/2024  8:52 AM EST ----- Normal liver enzyme.  Cholesterol not at goal.  Ensure taking rosuvastatin  20 mg 3 times per week.  This was just started 1 month ago and labs were collected early.  Would recommend repeat fasting  lipid panel, ALT in 6 to 8 weeks to assess full response to rosuvastatin .

## 2024-09-04 ENCOUNTER — Other Ambulatory Visit (HOSPITAL_BASED_OUTPATIENT_CLINIC_OR_DEPARTMENT_OTHER): Payer: Self-pay

## 2024-09-09 ENCOUNTER — Encounter (HOSPITAL_COMMUNITY): Payer: Self-pay

## 2024-09-11 ENCOUNTER — Other Ambulatory Visit (HOSPITAL_BASED_OUTPATIENT_CLINIC_OR_DEPARTMENT_OTHER): Payer: Self-pay | Admitting: Family

## 2024-09-11 ENCOUNTER — Ambulatory Visit (HOSPITAL_COMMUNITY)
Admission: RE | Admit: 2024-09-11 | Discharge: 2024-09-11 | Disposition: A | Discharge status: Home / Self Care | Source: Ambulatory Visit | Attending: Family | Admitting: Family

## 2024-09-11 DIAGNOSIS — Z8774 Personal history of (corrected) congenital malformations of heart and circulatory system: Secondary | ICD-10-CM

## 2024-09-11 MED ORDER — GADOBUTROL 1 MMOL/ML IV SOLN
13.0000 mL | Freq: Once | INTRAVENOUS | Status: AC | PRN
  Administered 2024-09-11: 13 mL via INTRAVENOUS

## 2024-09-22 ENCOUNTER — Encounter: Payer: Self-pay | Admitting: Family Medicine

## 2024-09-23 ENCOUNTER — Other Ambulatory Visit (HOSPITAL_BASED_OUTPATIENT_CLINIC_OR_DEPARTMENT_OTHER): Payer: Self-pay

## 2024-09-23 MED ORDER — ONETOUCH VERIO REFLECT W/DEVICE KIT
1.0000 | PACK | Freq: Every day | 0 refills | Status: AC
  Filled 2024-09-23: qty 1, 1d supply, fill #0

## 2024-09-25 ENCOUNTER — Other Ambulatory Visit (HOSPITAL_BASED_OUTPATIENT_CLINIC_OR_DEPARTMENT_OTHER): Payer: Self-pay

## 2024-09-25 ENCOUNTER — Emergency Department (HOSPITAL_COMMUNITY)
Admission: EM | Admit: 2024-09-25 | Discharge: 2024-09-25 | Disposition: A | Discharge status: Home / Self Care | Source: Home / Self Care

## 2024-09-25 ENCOUNTER — Other Ambulatory Visit: Payer: Self-pay

## 2024-09-25 ENCOUNTER — Emergency Department (HOSPITAL_COMMUNITY)

## 2024-09-25 ENCOUNTER — Encounter (HOSPITAL_COMMUNITY): Payer: Self-pay

## 2024-09-25 DIAGNOSIS — R519 Headache, unspecified: Secondary | ICD-10-CM | POA: Diagnosis not present

## 2024-09-25 DIAGNOSIS — Z79899 Other long term (current) drug therapy: Secondary | ICD-10-CM | POA: Diagnosis not present

## 2024-09-25 DIAGNOSIS — E119 Type 2 diabetes mellitus without complications: Secondary | ICD-10-CM | POA: Diagnosis not present

## 2024-09-25 DIAGNOSIS — H5710 Ocular pain, unspecified eye: Secondary | ICD-10-CM | POA: Diagnosis not present

## 2024-09-25 DIAGNOSIS — Z7982 Long term (current) use of aspirin: Secondary | ICD-10-CM | POA: Diagnosis not present

## 2024-09-25 DIAGNOSIS — H5711 Ocular pain, right eye: Secondary | ICD-10-CM | POA: Diagnosis not present

## 2024-09-25 DIAGNOSIS — R29818 Other symptoms and signs involving the nervous system: Secondary | ICD-10-CM

## 2024-09-25 DIAGNOSIS — I10 Essential (primary) hypertension: Secondary | ICD-10-CM | POA: Insufficient documentation

## 2024-09-25 DIAGNOSIS — H538 Other visual disturbances: Secondary | ICD-10-CM

## 2024-09-25 DIAGNOSIS — Z7984 Long term (current) use of oral hypoglycemic drugs: Secondary | ICD-10-CM | POA: Insufficient documentation

## 2024-09-25 DIAGNOSIS — R200 Anesthesia of skin: Secondary | ICD-10-CM | POA: Diagnosis not present

## 2024-09-25 LAB — DIFFERENTIAL
Abs Immature Granulocytes: 0.03 K/uL (ref 0.00–0.07)
Basophils Absolute: 0.1 K/uL (ref 0.0–0.1)
Basophils Relative: 1 %
Eosinophils Absolute: 0.1 K/uL (ref 0.0–0.5)
Eosinophils Relative: 1 %
Immature Granulocytes: 0 %
Lymphocytes Relative: 17 %
Lymphs Abs: 1.5 K/uL (ref 0.7–4.0)
Monocytes Absolute: 0.6 K/uL (ref 0.1–1.0)
Monocytes Relative: 6 %
Neutro Abs: 6.6 K/uL (ref 1.7–7.7)
Neutrophils Relative %: 75 %

## 2024-09-25 LAB — I-STAT CHEM 8, ED
BUN: 14 mg/dL (ref 6–20)
Calcium, Ion: 1.04 mmol/L — ABNORMAL LOW (ref 1.15–1.40)
Chloride: 104 mmol/L (ref 98–111)
Creatinine, Ser: 0.9 mg/dL (ref 0.61–1.24)
Glucose, Bld: 123 mg/dL — ABNORMAL HIGH (ref 70–99)
HCT: 48 % (ref 39.0–52.0)
Hemoglobin: 16.3 g/dL (ref 13.0–17.0)
Potassium: 4.2 mmol/L (ref 3.5–5.1)
Sodium: 139 mmol/L (ref 135–145)
TCO2: 24 mmol/L (ref 22–32)

## 2024-09-25 LAB — COMPREHENSIVE METABOLIC PANEL WITH GFR
ALT: 27 U/L (ref 0–44)
AST: 31 U/L (ref 15–41)
Albumin: 4.3 g/dL (ref 3.5–5.0)
Alkaline Phosphatase: 58 U/L (ref 38–126)
Anion gap: 11 (ref 5–15)
BUN: 12 mg/dL (ref 6–20)
CO2: 24 mmol/L (ref 22–32)
Calcium: 9.2 mg/dL (ref 8.9–10.3)
Chloride: 102 mmol/L (ref 98–111)
Creatinine, Ser: 0.83 mg/dL (ref 0.61–1.24)
GFR, Estimated: >60 mL/min
Glucose, Bld: 126 mg/dL — ABNORMAL HIGH (ref 70–99)
Potassium: 4.4 mmol/L (ref 3.5–5.1)
Sodium: 137 mmol/L (ref 135–145)
Total Bilirubin: 0.5 mg/dL (ref 0.0–1.2)
Total Protein: 6.8 g/dL (ref 6.5–8.1)

## 2024-09-25 LAB — CBC
HCT: 48.5 % (ref 39.0–52.0)
Hemoglobin: 16.1 g/dL (ref 13.0–17.0)
MCH: 28.2 pg (ref 26.0–34.0)
MCHC: 33.2 g/dL (ref 30.0–36.0)
MCV: 84.9 fL (ref 80.0–100.0)
Platelets: 213 K/uL (ref 150–400)
RBC: 5.71 MIL/uL (ref 4.22–5.81)
RDW: 13.9 % (ref 11.5–15.5)
WBC: 8.8 K/uL (ref 4.0–10.5)
nRBC: 0 % (ref 0.0–0.2)

## 2024-09-25 LAB — ETHANOL: Alcohol, Ethyl (B): <15 mg/dL

## 2024-09-25 LAB — PROTIME-INR
INR: 1 (ref 0.8–1.2)
Prothrombin Time: 13.8 s (ref 11.4–15.2)

## 2024-09-25 LAB — APTT: aPTT: 26 s (ref 24–36)

## 2024-09-25 LAB — CBG MONITORING, ED: Glucose-Capillary: 142 mg/dL — ABNORMAL HIGH (ref 70–99)

## 2024-09-25 MED ORDER — OXYCODONE-ACETAMINOPHEN 5-325 MG PO TABS
1.0000 | ORAL_TABLET | Freq: Four times a day (QID) | ORAL | 0 refills | Status: AC | PRN
  Filled 2024-09-25: qty 8, 2d supply, fill #0

## 2024-09-25 MED ORDER — TETRACAINE HCL 0.5 % OP SOLN
2.0000 [drp] | Freq: Once | OPHTHALMIC | Status: AC
  Administered 2024-09-25: 2 [drp] via OPHTHALMIC
  Filled 2024-09-25: qty 4

## 2024-09-25 MED ORDER — ONDANSETRON HCL 4 MG/2ML IJ SOLN
4.0000 mg | Freq: Once | INTRAMUSCULAR | Status: AC
  Administered 2024-09-25: 4 mg via INTRAVENOUS
  Filled 2024-09-25: qty 2

## 2024-09-25 MED ORDER — HYDROMORPHONE HCL 1 MG/ML IJ SOLN
1.0000 mg | Freq: Once | INTRAMUSCULAR | Status: AC
  Administered 2024-09-25: 1 mg via INTRAVENOUS
  Filled 2024-09-25: qty 1

## 2024-09-25 MED ORDER — KETOROLAC TROMETHAMINE 15 MG/ML IJ SOLN
15.0000 mg | Freq: Once | INTRAMUSCULAR | Status: AC
  Administered 2024-09-25: 15 mg via INTRAVENOUS
  Filled 2024-09-25: qty 1

## 2024-09-25 MED ORDER — GABAPENTIN 100 MG PO CAPS: 100.0000 mg | ORAL_CAPSULE | Freq: Three times a day (TID) | ORAL | 0 refills | Status: DC

## 2024-09-25 MED ORDER — DIPHENHYDRAMINE HCL 50 MG/ML IJ SOLN
25.0000 mg | Freq: Once | INTRAMUSCULAR | Status: AC
  Administered 2024-09-25: 25 mg via INTRAVENOUS
  Filled 2024-09-25: qty 1

## 2024-09-25 MED ORDER — FLUORESCEIN SODIUM 1 MG OP STRP
1.0000 | ORAL_STRIP | Freq: Once | OPHTHALMIC | Status: AC
  Administered 2024-09-25: 1 via OPHTHALMIC
  Filled 2024-09-25: qty 1

## 2024-09-25 MED ORDER — IOHEXOL 350 MG/ML SOLN
75.0000 mL | Freq: Once | INTRAVENOUS | Status: AC | PRN
  Administered 2024-09-25: 75 mL via INTRAVENOUS

## 2024-09-25 MED ORDER — SODIUM CHLORIDE 0.9% FLUSH
3.0000 mL | Freq: Once | INTRAVENOUS | Status: AC
  Administered 2024-09-25: 3 mL via INTRAVENOUS

## 2024-09-25 MED ORDER — OXYCODONE-ACETAMINOPHEN 5-325 MG PO TABS: 1.0000 | ORAL_TABLET | Freq: Four times a day (QID) | ORAL | 0 refills | Status: DC | PRN

## 2024-09-25 MED ORDER — GABAPENTIN 100 MG PO CAPS
100.0000 mg | ORAL_CAPSULE | Freq: Three times a day (TID) | ORAL | 0 refills | Status: AC
  Filled 2024-09-25 – 2024-10-17 (×3): qty 20, 7d supply, fill #0

## 2024-09-25 MED ORDER — GADOBUTROL 1 MMOL/ML IV SOLN
9.0000 mL | Freq: Once | INTRAVENOUS | Status: AC | PRN
  Administered 2024-09-25: 9 mL via INTRAVENOUS

## 2024-09-25 MED ORDER — PROCHLORPERAZINE EDISYLATE 10 MG/2ML IJ SOLN
10.0000 mg | Freq: Once | INTRAMUSCULAR | Status: AC
  Administered 2024-09-25: 10 mg via INTRAVENOUS
  Filled 2024-09-25: qty 2

## 2024-09-25 NOTE — ED Triage Notes (Signed)
 Pt BIBA from home as code stroke w/ right side facial droop and right eye pain since 10 am today. Pt taken to ct on arrival with stroke response team

## 2024-09-25 NOTE — Consult Note (Signed)
 NEUROLOGY CONSULT NOTE   Date of service: September 25, 2024 Patient Name: Derrick West MRN:  [id number] DOB:  [birth year] Chief Complaint: Sudden onset severe right eye pain, blurred vision, right facial numbness and heaviness Requesting Provider: Ellouise Richerd POUR, DO  History of Present Illness  Derrick West is a 49 y.o. male with hx of hypertension, hyperlipidemia and diabetes who presents with sudden onset severe right eye pain, blurred vision in the right eye as well as numbness and heaviness on the right side of the face.  Symptoms suddenly began at about 10:00 in the morning, with patient describing headache 8-9 out of 10 in intensity with pressure on his right eye.  He denies any trauma or injury to his eye or face.  Patient states that he feels as though the right side of his face is trying to droop downwards and he is trying to hold it up.  He initially had some facial asymmetry, but this resolved prior to evaluation in the emergency department.  He does not have a history of migraines and has never had symptoms like this before.  He reports having a recent cold as well as intermittent sharp chest pains which are typical for him, and stated when symptoms began he had some shortness of breath as well as nausea.  LKW: 1000 Modified rankin score: 0-Completely asymptomatic and back to baseline post- stroke IV Thrombolysis: No, symptoms likely not due to stroke also too mild to treat EVT: No, exam not consistent with LVO  NIHSS components Score: Comment  1a Level of Conscious 0[x]  1[]  2[]  3[]      1b LOC Questions 0[x]  1[]  2[]       1c LOC Commands 0[x]  1[]  2[]       2 Best Gaze 0[x]  1[]  2[]       3 Visual 0[x]  1[]  2[]  3[]      4 Facial Palsy 0[x]  1[]  2[]  3[]      5a Motor Arm - left 0[x]  1[]  2[]  3[]  4[]  UN[]    5b Motor Arm - Right 0[x]  1[]  2[]  3[]  4[]  UN[]    6a Motor Leg - Left 0[x]  1[]  2[]  3[]  4[]  UN[]    6b Motor Leg - Right 0[x]  1[]  2[]  3[]  4[]  UN[]    7 Limb Ataxia 0[x]  1[]  2[]  UN[]       8 Sensory 0[]  1[x]  2[]  UN[]      9 Best Language 0[x]  1[]  2[]  3[]      10 Dysarthria 0[x]  1[]  2[]  UN[]      11 Extinct. and Inattention 0[x]  1[]  2[]       TOTAL:1       ROS  Comprehensive ROS performed and pertinent positives documented in HPI   Past History   Past Medical History:  Diagnosis Date   Anxiety and depression    Cholecystitis    DIABETES MELLITUS, TYPE II 12/24/2007   Fatty liver    GANGLION CYST, WRIST, LEFT 07/29/2007   Headache(784.0)    Hypertension    OBESITY 12/24/2007    Past Surgical History:  Procedure Laterality Date   ANTERIOR CERVICAL DECOMP/DISCECTOMY FUSION N/A 04/15/2013   Procedure: ANTERIOR CERVICAL DECOMPRESSION/DISCECTOMY FUSION 1 LEVEL;  Surgeon: Rockey LITTIE Peru, MD;  Location: [facility];  Service: Neurosurgery;  Laterality: N/A;  Cervical Five-Six Anterior Cervical decompression with fusion plating and bonegraft   CHOLECYSTECTOMY N/A 09/05/2017   Procedure: LAPAROSCOPIC CHOLECYSTECTOMY;  Surgeon: Vernetta Berg, MD;  Location: [facility] OR;  Service: General;  Laterality: N/A;   ERCP N/A 09/14/2017   Procedure: ENDOSCOPIC RETROGRADE  CHOLANGIOPANCREATOGRAPHY (ERCP);  Surgeon: Avram Lupita BRAVO, MD;  Location: [facility] ENDOSCOPY;  Service: Endoscopy;  Laterality: N/A;   ESOPHAGOGASTRODUODENOSCOPY (EGD) WITH PROPOFOL  N/A 12/10/2017   Procedure: ESOPHAGOGASTRODUODENOSCOPY (EGD) WITH PROPOFOL ;  Surgeon: Avram Lupita BRAVO, MD;  Location: [facility] ENDOSCOPY;  Service: Endoscopy;  Laterality: N/A;   GASTROINTESTINAL STENT REMOVAL N/A 12/10/2017   Procedure: GASTROINTESTINAL STENT REMOVAL;  Surgeon: Avram Lupita BRAVO, MD;  Location: [facility] ENDOSCOPY;  Service: Endoscopy;  Laterality: N/A;   LUMBAR LAMINECTOMY     SPINE SURGERY  01/2002   Back surgery   VSD REPAIR     age 75    Family History: Family History  Problem Relation Age of Onset   Diabetes Mother    Colon polyps Father    Colon cancer Neg Hx    Esophageal cancer Neg Hx    Prostate cancer Neg Hx    Rectal  cancer Neg Hx     Social History  reports that he has never smoked. He has been exposed to tobacco smoke. He has never used smokeless tobacco. He reports current alcohol use. He reports that he does not use drugs.  Allergies[1]  Medications  Current Medications[2]  Vitals   Vitals:   19-Oct-2024 1300 10-19-24 1346 10-19-2024 1348 19-Oct-2024 1351  BP:   133/82   Pulse:   72   Resp:   16   Temp:   97.8 F (36.6 C)   TempSrc:   Oral   SpO2:  98% 100%   Weight: 89.2 kg   89.2 kg  Height:    5' 7 (1.702 m)    Body mass index is 30.8 kg/m.   Physical Exam   Constitutional: Appears well-developed and well-nourished.  Psych: Affect appropriate to situation.  Eyes: No scleral injection.  HENT: No OP obstruction.  Head: Normocephalic.  Cardiovascular: Normal rate and regular rhythm.  Respiratory: Effort normal, non-labored breathing.  Skin: WDI.   Neurologic Examination    NEURO:  Mental Status: AA&Ox3, able to give clear and coherent history of present illness Speech/Language: speech is without dysarthria or aphasia.  Naming, fluency, and comprehension intact.  Cranial Nerves:  II: PERRL. Visual fields full, some difficulty counting fingers in left lower quadrant but still able to see III, IV, VI: EOMI. Eyelids elevate symmetrically.  V: Numbness and heaviness to right side of face VII: Smile is symmetrical.  VIII: hearing intact to voice. IX, X: Phonation is normal.  XII: tongue is midline without fasciculations. Motor: Able to move all 4 extremities with symmetrical antigravity strength Tone: is normal and bulk is normal Sensation- Intact to light touch bilaterally. Extinction absent to light touch to DSS.  Coordination: FTN intact bilaterally Gait- deferred   Labs/Imaging/Neurodiagnostic studies   CBC:  Recent Labs  Lab 10-19-2024 1321 10-19-2024 1327  WBC 8.8  --   NEUTROABS 6.6  --   HGB 16.1 16.3  HCT 48.5 48.0  MCV 84.9  --   PLT 213  --    Basic  Metabolic Panel:  Lab Results  Component Value Date   NA 139 10/19/2024   K 4.2 10-19-24   CO2 26 07/22/2024   GLUCOSE 123 (H) Oct 19, 2024   BUN 14 10-19-2024   CREATININE 0.90 2024/10/19   CALCIUM  8.8 (L) 07/22/2024   GFRNONAA >60 07/22/2024   GFRAA 127 07/28/2020   Lipid Panel:  Lab Results  Component Value Date   LDLCALC 96 09/01/2024   HgbA1c:  Lab Results  Component Value Date   HGBA1C 6.4 (  A) 09/02/2024   Urine Drug Screen: No results found for: LABOPIA, COCAINSCRNUR, LABBENZ, AMPHETMU, THCU, LABBARB  Alcohol Level No results found for: [facility] INR  Lab Results  Component Value Date   INR 1.0 09/25/2024   APTT  Lab Results  Component Value Date   APTT 26 09/25/2024   CT Head without contrast(Personally reviewed): Ill-defined subcortical hypoattenuation in right corona radiata possibly representing age-indeterminate infarct versus demyelinating process versus chronic small vessel ischemic disease  CT angio Head and Neck with contrast(Personally reviewed): No LVO or hemodynamically significant stenosis  CT venogram: Pending  Maxillofacial CT: No acute abnormality of the face or orbits, minimal mucosal thickening of inferior right sphenoid sinus  MRI Brain and orbits with and without contrast (Personally reviewed): Pending   ASSESSMENT   HUNT ZAJICEK is a 49 y.o. male with history of hypertension, hyperlipidemia, diabetes and chest pain who presents with sudden onset severe right eye pain, blurred vision, severe pressure type headache and right facial numbness and heaviness.  Initial CT head revealed an ill-defined hypoattenuation in the right corona radiata which could represent a demyelinating process.  CTA revealed no LVO, and CT venogram revealed no DVST. Given location of hypoattenuation and  symptoms, stroke does not seem likely.  Given findings on CT, there is some concern for severe right optic neuritis, so we will pursue MRI brain and  orbits with and without contrast.  RECOMMENDATIONS  -MRI brain and orbits with and without contrast - Neurology will continue to follow ______________________________________________________________________  Patient seen by NP with MD, MD to edit note as needed.  Signed, Cortney E Everitt Clint Kill, NP [facility]   MRI brain and orbits wwo showed no e/o acute ischemia, demyelinating disease, or optic neuritis. Recommend consultation with ophthalmology. Neurology to sign off, but please re-engage if additional neurologic concerns arise.  Elida Ross, MD [facility] [phone]  If 7pm- 7am, please page neurology on call as listed in AMION.     [1]  Allergies Allergen Reactions   Amoxicillin Other (See Comments)    From childhood; reaction not recalled  [2]  Current Facility-Administered Medications:    sodium chloride  flush (NS) 0.9 % injection 3 mL, 3 mL, Intravenous, Once, Kingsley, Victoria K, DO  Current Outpatient Medications:    anastrozole  (ARIMIDEX ) 1 MG tablet, Take 1 tablet (1 mg total) by mouth once a week for 4 weeks, then ONE-HALF tablet once a week on the day of injection, Disp: 8 tablet, Rfl: 0   aspirin  EC 81 MG tablet, Take 1 tablet (81 mg total) by mouth daily. Swallow whole., Disp: 90 tablet, Rfl: 3   Blood Glucose Monitoring Suppl (ONETOUCH VERIO REFLECT) w/Device KIT, Use as directed, Disp: 1 kit, Rfl: 0   Cholecalciferol (VITAMIN D -3 PO), Take 1 tablet by mouth daily. Vitamin K 100mcg, Disp: , Rfl:    glucose blood test strip, Use as instructed once daily., Disp: 100 each, Rfl: 12   ibuprofen  (ADVIL ,MOTRIN ) 200 MG tablet, Take 400 mg by mouth every 6 (six) hours as needed (for pain)., Disp: , Rfl:    Lancets (ONETOUCH DELICA PLUS LANCET33G) MISC, Use as directed., Disp: 100 each, Rfl: 11   lisinopril  (ZESTRIL ) 10 MG tablet, Take 1 tablet (10 mg total) by mouth daily., Disp: 90 tablet, Rfl: 3   metFORMIN  (GLUCOPHAGE -XR) 500 MG 24 hr  tablet, Take 2 tablets (1,000 mg total) by mouth daily with breakfast., Disp: 60 tablet, Rfl: 1   Multiple Vitamins-Minerals (MULTIVITAMIN WITH MINERALS) tablet, Take  1 tablet by mouth daily. With Iron, Disp: , Rfl:    rosuvastatin  (CRESTOR ) 20 MG tablet, Take 1 tablet (20 mg total) by mouth 3 (three) times a week., Disp: 15 tablet, Rfl: 2   tadalafil  (CIALIS ) 10 MG tablet, Take 1 tablet (10 mg total) by mouth daily., Disp: 90 tablet, Rfl: 3   tadalafil  (CIALIS ) 5 MG tablet, Take 1 tablet (5 mg total) by mouth daily., Disp: 30 tablet, Rfl: 5   tadalafil  (CIALIS ) 5 MG tablet, Take 1 tablet (5 mg total) by mouth daily. (Patient not taking: Reported on 09/02/2024), Disp: 30 tablet, Rfl: 4   testosterone  cypionate (DEPOTESTOSTERONE CYPIONATE) 200 MG/ML injection, as directed., Disp: , Rfl:    testosterone  cypionate (DEPOTESTOSTERONE CYPIONATE) 200 MG/ML injection, Inject 0.8 mLs (160 mg total) into the muscle once a week., Disp: 4 mL, Rfl: 2   triamcinolone  cream (KENALOG ) 0.1 %, APPLY TO AFFECTED AREA TWICE A DAY, Disp: 60 g, Rfl: 0   vitamin B-12 (CYANOCOBALAMIN) 500 MCG tablet, Take 500 mcg by mouth daily., Disp: , Rfl:

## 2024-09-25 NOTE — ED Provider Notes (Signed)
 " [place] EMERGENCY DEPARTMENT AT [facility] Provider Note   CSN: [id number] Arrival date & time: 09/25/24  1319  An emergency department physician performed an initial assessment on this suspected stroke patient at 1320.  Patient presents with: Code Stroke   Derrick West is a 49 y.o. male.   Patient is a 49 year old male with a past medical history of hypertension and diabetes that presented to the emergency department as a code stroke.  Patient states that around 10 AM this morning he suddenly felt very lightheaded and dizzy.  He states that he developed severe pain on the right side of his eye and right side of his head.  He states that he is having pain both in his eyeball and in his head around his right eye.  He states that his vision does seem slightly blurred in the right eye. Does endorse photophobia. He states he had nausea but denies any vomiting.  He denies any numbness or weakness.  He called the EMS who was concern for right sided facial droop and he was made a prehospital arrival code stroke.  The history is provided by the patient.       Prior to Admission medications  Medication Sig Start Date End Date Taking? Authorizing Provider  anastrozole  (ARIMIDEX ) 1 MG tablet Take 1 tablet (1 mg total) by mouth once a week for 4 weeks, then ONE-HALF tablet once a week on the day of injection 08/10/24     aspirin  EC 81 MG tablet Take 1 tablet (81 mg total) by mouth daily. Swallow whole. 09/25/22   Vannie Reche RAMAN, NP  Blood Glucose Monitoring Suppl (ONETOUCH VERIO REFLECT) w/Device KIT Use as directed 09/23/24   Mercer Clotilda SAUNDERS, MD  Cholecalciferol (VITAMIN D -3 PO) Take 1 tablet by mouth daily. Vitamin K 100mcg    [provider]  glucose blood test strip Use as instructed once daily. 08/10/24   Mercer Clotilda SAUNDERS, MD  ibuprofen  (ADVIL ,MOTRIN ) 200 MG tablet Take 400 mg by mouth every 6 (six) hours as needed (for pain).    [provider]  Lancets  [facility] DELICA PLUS [place]) MISC Use as directed. 09/02/24   Mercer Clotilda SAUNDERS, MD  lisinopril  (ZESTRIL ) 10 MG tablet Take 1 tablet (10 mg total) by mouth daily. 08/06/24   Mercer Clotilda SAUNDERS, MD  metFORMIN  (GLUCOPHAGE -XR) 500 MG 24 hr tablet Take 2 tablets (1,000 mg total) by mouth daily with breakfast. 08/13/24   Mercer Clotilda SAUNDERS, MD  Multiple Vitamins-Minerals (MULTIVITAMIN WITH MINERALS) tablet Take 1 tablet by mouth daily. With Iron    [provider]  rosuvastatin  (CRESTOR ) 20 MG tablet Take 1 tablet (20 mg total) by mouth 3 (three) times a week. 08/05/24 11/03/24  Vannie Reche RAMAN, NP  tadalafil  (CIALIS ) 10 MG tablet Take 1 tablet (10 mg total) by mouth daily. 03/19/24     tadalafil  (CIALIS ) 5 MG tablet Take 1 tablet (5 mg total) by mouth daily. 08/06/24   Mercer Clotilda SAUNDERS, MD  tadalafil  (CIALIS ) 5 MG tablet Take 1 tablet (5 mg total) by mouth daily. Patient not taking: Reported on 09/02/2024 12/30/23     testosterone  cypionate (DEPOTESTOSTERONE CYPIONATE) 200 MG/ML injection as directed.    [provider]  testosterone  cypionate (DEPOTESTOSTERONE CYPIONATE) 200 MG/ML injection Inject 0.8 mLs (160 mg total) into the muscle once a week. 08/10/24     triamcinolone  cream (KENALOG ) 0.1 % APPLY TO AFFECTED AREA TWICE A DAY 01/31/24   Mercer Clotilda SAUNDERS, MD  vitamin B-12 (CYANOCOBALAMIN) 500 MCG tablet Take 500 mcg by mouth daily.    [provider]    Allergies: Amoxicillin    Review of Systems  Updated Vital Signs BP 133/82 (BP Location: Right Arm)   Pulse 72   Temp 97.8 F (36.6 C) (Oral)   Resp 16   Ht 5' 7 (1.702 m)   Wt 89.2 kg   SpO2 100%   BMI 30.80 kg/m   Physical Exam Vitals and nursing note reviewed.  Constitutional:      General: He is not in acute distress.    Appearance: Normal appearance.     Comments: Uncomfortable appearing  HENT:     Head: Normocephalic and atraumatic.     Nose: Nose normal.     Mouth/Throat:     Mouth: Mucous  membranes are moist.     Pharynx: Oropharynx is clear.  Eyes:     General:        Right eye: No foreign body or discharge.        Left eye: No foreign body or discharge.     Intraocular pressure: Right eye pressure is 14 mmHg. Left eye pressure is 11 mmHg.     Extraocular Movements: Extraocular movements intact.     Conjunctiva/sclera: Conjunctivae normal.     Pupils: Pupils are equal, round, and reactive to light.     Comments: No fluorescein uptake bilaterally Tenderness to palpation along right orbit  Cardiovascular:     Rate and Rhythm: Normal rate and regular rhythm.     Heart sounds: Normal heart sounds.  Pulmonary:     Effort: Pulmonary effort is normal.     Breath sounds: Normal breath sounds.  Abdominal:     General: Abdomen is flat.     Palpations: Abdomen is soft.     Tenderness: There is no abdominal tenderness.  Musculoskeletal:        General: Normal range of motion.     Cervical back: Normal range of motion and neck supple.  Skin:    General: Skin is warm and dry.  Neurological:     Mental Status: He is alert and oriented to person, place, and time.     Comments: Possible mild R-sided facial droop, remainder of cranial nerves intact  Normal speech Sensation intact in face and all 4 extremities Strength intact in all 4 extremities  Psychiatric:        Mood and Affect: Mood normal.        Behavior: Behavior normal.     (all labs ordered are listed, but only abnormal results are displayed) Labs Reviewed  COMPREHENSIVE METABOLIC PANEL WITH GFR - Abnormal; Notable for the following components:      Result Value   Glucose, Bld 126 (*)    All other components within normal limits  I-STAT CHEM 8, ED - Abnormal; Notable for the following components:   Glucose, Bld 123 (*)    Calcium , Ion 1.04 (*)    All other components within normal limits  CBG MONITORING, ED - Abnormal; Notable for the following components:   Glucose-Capillary 142 (*)    All other components  within normal limits  PROTIME-INR  APTT  CBC  DIFFERENTIAL  ETHANOL    EKG: None  Radiology: CT VENOGRAM HEAD Result Date: 09/25/2024 EXAM: CT VENOGRAM WITH CONTRAST 09/25/2024 01:40:00 PM TECHNIQUE: CT venogram of the head/brain was performed with the administration of intravenous contrast. Multiplanar reformatted images are provided for review. MIP images are provided  for review. Automated exposure control, iterative reconstruction, and/or weight based adjustment of the mA/kV was utilized to reduce the radiation dose to as low as reasonably achievable. COMPARISON: None available. CLINICAL HISTORY: Dural venous sinus thrombosis suspected; severe R eye pain and pressure, R facial numbness Dural venous sinus thrombosis suspected; severe R eye pain and pressure, R facial numbness FINDINGS: BRAIN/VENTRICLES: No acute intracranial hemorrhage. No extra axial fluid collection. Gray-white differentiation is maintained. No mass effect or midline shift. No hydrocephalus. ORBITS: No acute abnormality. SINUSES AND MASTOIDS: No acute abnormality. SOFT TISSUES AND SKULL: No acute abnormality. CT VENOGRAM: No dural venous sinus thrombosis. No significant stenosis. IMPRESSION: 1. No dural venous sinus thrombosis or significant stenosis. Electronically signed by: Donnice Mania MD 09/25/2024 02:14 PM EST RP Workstation: [id number]   CT ANGIO HEAD W OR WO CONTRAST Result Date: 09/25/2024 EXAM: CTA Head without and with Intravenous Contrast CLINICAL HISTORY: severe R eye pain and pressure, R facial numbness TECHNIQUE: Axial CTA images of the head without and with intravenous contrast. MIP reconstructed images were created and reviewed. Dose reduction technique was used including one or more of the following: automated exposure control, adjustment of mA and kV according to patient size, and/or iterative reconstruction. CONTRAST: Without and with; 75 mL iohexol  (OMNIPAQUE ) 350 MG/ML injection. COMPARISON: Same day CT head.  FINDINGS: INTERNAL CAROTID ARTERIES: Atherosclerosis of the left cavernous ICA resulting in mild stenosis. The right intracranial ICA is patent with no significant stenosis. No occlusion. No aneurysm. ANTERIOR CEREBRAL ARTERIES: No significant stenosis. No occlusion. No aneurysm. MIDDLE CEREBRAL ARTERIES: No significant stenosis. No occlusion. No aneurysm. POSTERIOR CEREBRAL ARTERIES: Fetal origin of the left PCA. No significant stenosis. No occlusion. No aneurysm. BASILAR ARTERY: No significant stenosis. No occlusion. No aneurysm. VERTEBRAL ARTERIES: No significant stenosis. No occlusion. No aneurysm. SOFT TISSUES: No acute finding. No masses or lymphadenopathy. BONES: No acute osseous abnormality. IMPRESSION: 1. Patent intracranial arteries. 2. Atherosclerosis of the left cavernous ICA resulting in mild stenosis. Electronically signed by: Donnice Mania MD 09/25/2024 01:57 PM EST RP Workstation: [id number]   CT MAXILLOFACIAL WO CONTRAST Result Date: 09/25/2024 EXAM: CT Face without contrast 09/25/2024 01:30:00 PM TECHNIQUE: CT of the face was performed without the administration of intravenous contrast. Multiplanar reformatted images are provided for review. Automated exposure control, iterative reconstruction, and/or weight based adjustment of the mA/kV was utilized to reduce the radiation dose to as low as reasonably achievable. COMPARISON: None available CLINICAL HISTORY: severe R eye pain and pressure, R facial numbness FINDINGS: AERODIGESTIVE TRACT: No mass. No edema. SALIVARY GLANDS: No acute abnormality. LYMPH NODES: No suspicious cervical lymphadenopathy. SOFT TISSUES: No mass or fluid collection. BRAIN, ORBITS AND SINUSES: Minimal mucosal thickening is present in the inferior right sphenoid sinus. BONES: No acute abnormality. No suspicious bone lesion. IMPRESSION: 1. No acute abnormality of the face or orbits. 2. Minimal mucosal thickening in the inferior right sphenoid sinus. Electronically signed by:  Lonni Necessary MD 09/25/2024 01:43 PM EST RP Workstation: [id number]   CT HEAD CODE STROKE WO CONTRAST Result Date: 09/25/2024 EXAM: CT HEAD WITHOUT CONTRAST 09/25/2024 01:30:00 PM TECHNIQUE: CT of the head was performed without the administration of intravenous contrast. Automated exposure control, iterative reconstruction, and/or weight based adjustment of the mA/kV was utilized to reduce the radiation dose to as low as reasonably achievable. COMPARISON: None available. CLINICAL HISTORY: Neuro deficit, acute, stroke suspected; severe R eye pain and pressure, R facial numbness. FINDINGS: BRAIN AND VENTRICLES: Ill defined subcortical hypoattenuation was present in the right corona  radiata measuring up to 10 mm. A slightly less well defined area of hypoattenuation is present in the subcortical left parietal lobe. No acute hemorrhage. No hydrocephalus. No extra-axial collection. No mass effect or midline shift. ORBITS: No acute abnormality. SINUSES: No acute abnormality. SOFT TISSUES AND SKULL: No acute soft tissue abnormality. No skull fracture. [place] stroke program early CT score (aspects) ----- Ganglionic (caudate, IC, lentiform nucleus, insula, M1-M3): 7 Supraganglionic (M4-M6): 3 Total: 10 IMPRESSION: 1. Ill-defined subcortical hypoattenuation in the right corona radiata measuring up to 10 mm, which may represent an age-indeterminate infarct in the setting of right facial numbness. 2. Slightly less well-defined area of hypoattenuation in the subcortical left parietal lobe. These lesions may be related to chronic small vessel ischemic disease in this patient with diabetes. A demyelinating process is also considered. 3. ASPECTS 10. 4. These results were communicated to Dr. Matthews at 1:38 PM on 09/25/2024 by secure text page via the [facility] messaging system. Electronically signed by: Lonni Necessary MD 09/25/2024 01:40 PM EST RP Workstation: [id number]     Procedures   Medications Ordered in the ED   sodium chloride  flush (NS) 0.9 % injection 3 mL (3 mLs Intravenous Given 09/25/24 1423)  iohexol  (OMNIPAQUE ) 350 MG/ML injection 75 mL (75 mLs Intravenous Contrast Given 09/25/24 1341)  fluorescein ophthalmic strip 1 strip (1 strip Both Eyes Given 09/25/24 1423)  tetracaine (PONTOCAINE) 0.5 % ophthalmic solution 2 drop (2 drops Both Eyes Given 09/25/24 1423)  ketorolac (TORADOL) 15 MG/ML injection 15 mg (15 mg Intravenous Given 09/25/24 1422)    Clinical Course as of 09/25/24 1521  Fri Sep 25, 2024  1418 I spoke with Dr. Matthews with neurology, low suspicion for stroke with pain. Concern for possible demyelinating disorder or optic neuritis and recommended MRI. CTA/CTV without acute abnormality to explain symptoms.  [VK]  1521 Patient signed out to Dr. Ula pending MRI for disposition.  [VK]    Clinical Course User Index [VK] Kingsley, Yolanda Huffstetler K, DO                                 Medical Decision Making This patient presents to the ED with chief complaint(s) of R eye pain with pertinent past medical history of HTN, DM which further complicates the presenting complaint. The complaint involves an extensive differential diagnosis and also carries with it a high risk of complications and morbidity.    The differential diagnosis includes CVA, TIA, ICH, mass effect, glaucoma, optic neuritis, migraine headache, foreign body, corneal abrasion or ulceration, migraine headache  Additional history obtained: Additional history obtained from EMS Records reviewed Primary Care Documents  ED Course and Reassessment: She was made prehospital arrival code stroke and was met by neurology and myself at the door, his airway was intact and he was transported to CT scanner.  He was hemodynamically stable on arrival.  CT Noncon showed no ICH or mass effect but showed signs of a possible demyelination, he additionally had CT max face, CTA and CTV that did not show acute abnormality to explain symptoms.  Patient had ocular  exam which had limited visual acuity with normal pressures and no fluorescein uptake.  He will be given Toradol for his headache.  Independent labs interpretation:  The following labs were independently interpreted: within normal range  Independent visualization of imaging: - I independently visualized the following imaging with scope of interpretation limited to determining acute life threatening conditions related to  emergency care: CTH/CTA/CTV, CT Max/face, which revealed possible demyelination otherwise not acute abnormality  Consultation: - Consulted or discussed management/test interpretation w/ external professional: neurology   Amount and/or Complexity of Data Reviewed Labs: ordered.  Risk Prescription drug management.       Final diagnoses:  Acute right eye pain  Acute nonintractable headache, unspecified headache type    ED Discharge Orders     None          Ellouise Richerd POUR, DO 09/25/24 1519  "

## 2024-09-25 NOTE — ED Notes (Signed)
 Pt provided with discharge and follow up instructions, medications discussed, pt verbalized understanding. LDA removed, VSS, pt ambulatory out of ED w/ all paperwork and belongings in NAD.

## 2024-09-25 NOTE — Consult Note (Signed)
 Ophthalmology Initial Consult Note  Derrick West, 49 y.o. male Date of Service:  09/25/2024  Requesting physician: Ula Prentice SAUNDERS, MD  Information Obtained from: patient Chief Complaint:  periocular pain/numbness  HPI/Discussion:  Derrick West is a 49 y.o. male who presents with acute onset pain/numbness/tingling around the right eye and pain in the eye itself. Denies VA changes. Stroke workup so far has been unrevealing. PMHx is relevant for longstanding diabetes, well controlled per patient and chart review.  Past Ocular Hx:  None Ocular Meds:  None Family ocular history: Noncontributory  Past Medical History:  Diagnosis Date   Anxiety and depression    Cholecystitis    DIABETES MELLITUS, TYPE II 12/24/2007   Fatty liver    GANGLION CYST, WRIST, LEFT 07/29/2007   Headache(784.0)    Hypertension    OBESITY 12/24/2007   Past Surgical History:  Procedure Laterality Date   ANTERIOR CERVICAL DECOMP/DISCECTOMY FUSION N/A 04/15/2013   Procedure: ANTERIOR CERVICAL DECOMPRESSION/DISCECTOMY FUSION 1 LEVEL;  Surgeon: Rockey LITTIE Peru, MD;  Location: [facility] NEURO ORS;  Service: Neurosurgery;  Laterality: N/A;  Cervical Five-Six Anterior Cervical decompression with fusion plating and bonegraft   CHOLECYSTECTOMY N/A 09/05/2017   Procedure: LAPAROSCOPIC CHOLECYSTECTOMY;  Surgeon: Vernetta Berg, MD;  Location: [facility] OR;  Service: General;  Laterality: N/A;   ERCP N/A 09/14/2017   Procedure: ENDOSCOPIC RETROGRADE CHOLANGIOPANCREATOGRAPHY (ERCP);  Surgeon: Avram Lupita BRAVO, MD;  Location: [facility] ENDOSCOPY;  Service: Endoscopy;  Laterality: N/A;   ESOPHAGOGASTRODUODENOSCOPY (EGD) WITH PROPOFOL  N/A 12/10/2017   Procedure: ESOPHAGOGASTRODUODENOSCOPY (EGD) WITH PROPOFOL ;  Surgeon: Avram Lupita BRAVO, MD;  Location: [facility] ENDOSCOPY;  Service: Endoscopy;  Laterality: N/A;   GASTROINTESTINAL STENT REMOVAL N/A 12/10/2017   Procedure: GASTROINTESTINAL STENT REMOVAL;  Surgeon: Avram Lupita BRAVO, MD;  Location: [facility]  ENDOSCOPY;  Service: Endoscopy;  Laterality: N/A;   LUMBAR LAMINECTOMY     SPINE SURGERY  01/2002   Back surgery   VSD REPAIR     age 27    Prior to Admission Meds: See Epic  Inpatient Meds: See Epic  Allergies[1] Social History   Tobacco Use   Smoking status: Never    Passive exposure: Past   Smokeless tobacco: Never  Substance Use Topics   Alcohol use: Yes    Comment: occ   Family History  Problem Relation Age of Onset   Diabetes Mother    Colon polyps Father    Colon cancer Neg Hx    Esophageal cancer Neg Hx    Prostate cancer Neg Hx    Rectal cancer Neg Hx     ROS: Other than ROS in the HPI, all other systems were negative.  Exam: Temp: 97.9 F (36.6 C) Pulse Rate: 83 BP: 123/71 Resp: 14 SpO2: 100 %  Visual Acuity:  near   OD 20/50   OS 20/50     OD OS  Confr Vis Fields Full to fingers Full to fingers  EOM (Primary) Full Full  Lids/Lashes Normal Normal  Conjunctiva White, quiet White, quiet  Adnexa  Normal Normal  Pupils  3 --> 2, brisk, no rAPD 3 --> 2, brisk, no rAPD  Cornea  Clear, no abrasion Clear, no abrasion  Anterior Chamber Formed, grossly quiet Formed, grossly quiet  Lens:  Clear Clear  IOP 18 14  Fundus - Dilated? YES   Optic Disc 0.4, pink, no edema 0.4, pink, no edema  Post Seg:  Retina  Vessels Normal caliber Normal caliber                  Vitreous  Clear Clear                  Macula Good foveal reflex Good foveal reflex                  Periphery Normal, no diabetic retinopathy Normal, no diabetic retinopathy       Neuro:  Oriented to person, place, and time:  Yes Psychiatric:  Mood and Affect Appropriate:  Yes  Labs/imaging:   A/P:  49 y.o. male with:  1) Periocular/eye pain right - No clear ophthalmic etiology identified. - Given periocular symptoms, might consider trigeminal neuralgia or less likely herpetic (pre-rash). - Uveitis would be much less likely and would not explain periocular  symptoms. - Optic neuritis can present with pain with eye movement, but there is no rAPD. Also, this would not explain periocular pain/tingling.  2) DM without retinopathy - Glucose control.  Recommend outpatient follow-up exam with me in 2-3 weeks, sooner if worsening ocular symptoms. It is okay to give patient my cell phone - [phone].  [name], MD  [name], MD 09/25/2024, 7:19 PM      [1]  Allergies Allergen Reactions   Amoxicillin Other (See Comments)    From childhood; reaction not recalled

## 2024-09-25 NOTE — ED Notes (Signed)
 Optho at bedside.

## 2024-09-25 NOTE — Discharge Instructions (Signed)
 Please take the gabapentin in the event that your symptoms are due to trigeminal neuralgia.  If you are still having pain is okay to take the Percocet.  Do not drive or drink alcohol taking this as it may make you drowsy.  Follow-up with the ophthalmologist.  They will like to see you in a few weeks.  If you are having worsening visual symptoms please follow-up sooner and call Dr. Eyvonne.  Return to the ER for worsening symptoms.

## 2024-09-25 NOTE — ED Notes (Signed)
 Pt to MRI

## 2024-09-25 NOTE — Code Documentation (Signed)
 Stroke Response Nurse Documentation Code Documentation  Derrick West is a 50 y.o. male arriving to [facility]  via [facility] on 09/25/2024 with past medical hx of HTN HLD DM. On aspirin  81 mg daily. Code stroke was activated by EMS.   Patient from home where he was LKW at 10:00 and now complaining of severe rt eye pain, blurry vision, rt facial sensory change. Pt states this started while he was with his daughter running errands at 10:00, and he went home to see if it would improve, but it didn't.  Stroke team at the bedside on patient arrival. Labs drawn and patient cleared for CT by Dr. Ellouise. Patient to CT with team. NIHSS 1, see documentation for details and code stroke times. Patient with right decreased sensation on exam. The following imaging was completed:  CT Head and CTA. Patient is not a candidate for IV Thrombolytic due to stroke not suspected. Patient is not a candidate for IR due to no LVO on advanced imaging..   Care Plan:    No acute treatment/TIA alert: q2h x 12 hours NIHSS & VS, then q4h   Bedside handoff with ED RN Slater.    Derrick West  Stroke Response RN

## 2024-09-25 NOTE — ED Notes (Signed)
 Pt ambulatory to BR with steady gait.

## 2024-09-25 NOTE — ED Provider Notes (Signed)
 49 year old male presenting to the emergency department today as a code stroke.  Head CT imaging and concern for possible optic neuritis.  MRI pending at the time of signout.  Plan is for reassessment after MRI and possible ophthalmology consult if MRIs are not consistent with optic neuritis.  Physical Exam  BP 133/82 (BP Location: Right Arm)   Pulse 72   Temp 97.8 F (36.6 C) (Oral)   Resp 16   Ht 5' 7 (1.702 m)   Wt 89.2 kg   SpO2 100%   BMI 30.80 kg/m   Physical Exam  Procedures  Procedures  ED Course / MDM   Clinical Course as of 09/25/24 1528  Fri Sep 25, 2024  1418 I spoke with Dr. Matthews with neurology, low suspicion for stroke with pain. Concern for possible demyelinating disorder or optic neuritis and recommended MRI. CTA/CTV without acute abnormality to explain symptoms.  [VK]  1521 Patient signed out to Dr. Ula pending MRI for disposition.  [VK]    Clinical Course User Index [VK] Kingsley, Victoria K, DO   Medical Decision Making Amount and/or Complexity of Data Reviewed Labs: ordered.  Risk Prescription drug management.     Visual Acuity  Right Eye Distance:  (unable to read/perform) Left Eye Distance:  (unable to read/perform) Bilateral Distance:  (unable to read/perform)  Right Eye Near: R Near: 10/80 Left Eye Near:  L Near: 10/63 Bilateral Near:   (unable to read/perform)  The patient's MRIs were negative.  He was still having a lot of visual complaints.  He is evaluated by ophthalmology.  Recommended outpatient follow-up.  The patient is given pain medications did have improvement in symptoms.  He is discharged with return precautions.       Ula Prentice SAUNDERS, MD 09/25/24 2221

## 2024-09-26 ENCOUNTER — Other Ambulatory Visit (HOSPITAL_BASED_OUTPATIENT_CLINIC_OR_DEPARTMENT_OTHER): Payer: Self-pay

## 2024-09-28 ENCOUNTER — Other Ambulatory Visit: Payer: Self-pay

## 2024-09-28 ENCOUNTER — Encounter: Payer: Self-pay | Admitting: Family Medicine

## 2024-09-29 ENCOUNTER — Other Ambulatory Visit: Payer: Self-pay

## 2024-09-29 ENCOUNTER — Other Ambulatory Visit (HOSPITAL_BASED_OUTPATIENT_CLINIC_OR_DEPARTMENT_OTHER): Payer: Self-pay

## 2024-09-29 MED ORDER — DOXYCYCLINE HYCLATE 100 MG PO CAPS
100.0000 mg | ORAL_CAPSULE | Freq: Two times a day (BID) | ORAL | 0 refills | Status: AC
  Filled 2024-09-29: qty 20, 10d supply, fill #0

## 2024-09-29 NOTE — Progress Notes (Signed)
 [facility] Patient Information  Location Information: Patient State (at time of visit): [place]  Patient Location (at time of visit):Home/Other Non-Medical  Provider Location: Home Is provider licensed to provide clinical care in the current location/state of the patient? Yes  Consent  Patient's identity was confirmed. Presenting condition or illness was discussed with the patient/personal representative. Current proposed treatment for presenting condition or illness was explained to patient/personal representative along with the likely benefits and any significant risks or complications associated with the provision of treatment by audio/video means. The patient/personal representative verbally authorized treatment to be provided by audio/video, which may include a limited review of patient's current health status, medication, or other treatment recommendations, patient education, and an opportunity to ask questions about condition and treatment. Verbal Consent Granted by Patient/Personal Representative:Yes   Visit Information: Modality: 2-Way Real-Time Audio/Video  Video Visit Start Time/ End Time:  Start time: 09/29/2024  5:43 PM EST End time: 09/29/2024  5:49 PM EST  Video Total Time: 6 minutes  History of Present Illness  Derrick West presents due to concerns for sinus infection.  Patient states 3-week history of sinus pressure and congestion.  He denies any fevers chills or bodyaches he denies any coughing wheezing or shortness of breath.  Has been using some Allegra-D but not improving symptoms are moderate and without any palliative or provocative factors.  All other systems are reviewed and are negative  Video Exam  Physical Exam Constitutional:      Appearance: Normal appearance. He is not ill-appearing or diaphoretic.  HENT:     Right Ear: External ear normal.     Left Ear: External ear normal.     Nose: Congestion present. No rhinorrhea.      Mouth/Throat:     Pharynx: No oropharyngeal exudate or posterior oropharyngeal erythema.  Eyes:     General:        Right eye: No discharge.        Left eye: No discharge.     Extraocular Movements: Extraocular movements intact.     Conjunctiva/sclera: Conjunctivae normal.  Pulmonary:     Effort: Pulmonary effort is normal. No respiratory distress.     Breath sounds: No stridor.     Comments: Able to speak in fluid sentences with no gasping.  No audible wheeze Neurological:     General: No focal deficit present.     Mental Status: He is alert.  Psychiatric:        Mood and Affect: Mood normal.        Behavior: Behavior normal.        Thought Content: Thought content normal.     Diagnosis, Medical Decision Making & Disposition  Assessment/ Plan 1. Acute non-recurrent maxillary sinusitis (Primary) - doxycycline  (VIBRAMYCIN ) 100 mg capsule; Take 1 capsule (100 mg total) by mouth 2 (two) times a day for 10 days. Take with 8 oz water. Do not lie down for at least 30 minutes after.  Dispense: 20 capsule; Refill: 0   Patient will continue with his Allegra-D and will follow-up if not better in 7 to 10 days For questions or concerns regarding this visit, patient should contact Virtual Support at [phone].  Disposition:  Patient to continue care at home.  Electronically signed: Bette PARAS DePinto, MD 09/29/2024  5:51 PM Behavioral Health Screening  Patient Health Questionnaire-2 Score: 0 (09/29/2024  5:49 PM)      Patient's Depression screening is Negative   Depression Plan: Normal/Negative Screening

## 2024-09-30 LAB — OPHTHALMOLOGY REPORT-SCANNED

## 2024-10-06 ENCOUNTER — Telehealth: Payer: Self-pay

## 2024-10-06 NOTE — Telephone Encounter (Signed)
 Copied from CRM #[id number]. Topic: Referral - Question >> Oct 06, 2024  2:10 PM Mercedes MATSU wrote: Reason for CRM: Patients wife states the patient had a stroke 2 weeks ago. Patient was taken to the ER, and they ran a couple of test, they also followed up with an ophthalmologist. Ophthalmologist states that the only concern was the migraines he has been having, along with right eye pain. The hospital gave him some medications that did not help. Patients wife took his wife Maxil medication which helped, but the eye pain and tenderness has still remained. Patient is requesting a referral for Neurology. Patients wife can be reached at [phone] she has questions.

## 2024-10-06 NOTE — Telephone Encounter (Signed)
 Wife called and is not on DPR for this office I let her tell me her concern, no information was given, she was told to have the patient call back and sch an appt

## 2024-10-09 ENCOUNTER — Other Ambulatory Visit: Payer: Self-pay | Admitting: Family Medicine

## 2024-10-09 DIAGNOSIS — E118 Type 2 diabetes mellitus with unspecified complications: Secondary | ICD-10-CM

## 2024-10-10 ENCOUNTER — Other Ambulatory Visit (HOSPITAL_BASED_OUTPATIENT_CLINIC_OR_DEPARTMENT_OTHER): Payer: Self-pay

## 2024-10-12 ENCOUNTER — Other Ambulatory Visit (HOSPITAL_BASED_OUTPATIENT_CLINIC_OR_DEPARTMENT_OTHER): Payer: Self-pay

## 2024-10-12 ENCOUNTER — Other Ambulatory Visit: Payer: Self-pay

## 2024-10-14 ENCOUNTER — Other Ambulatory Visit: Payer: Self-pay

## 2024-10-14 ENCOUNTER — Other Ambulatory Visit (HOSPITAL_BASED_OUTPATIENT_CLINIC_OR_DEPARTMENT_OTHER): Payer: Self-pay

## 2024-10-14 MED ORDER — METFORMIN HCL ER 500 MG PO TB24
1000.0000 mg | ORAL_TABLET | Freq: Every day | ORAL | 1 refills | Status: AC
  Filled 2024-10-14: qty 180, 90d supply, fill #0

## 2024-10-14 MED ORDER — LISINOPRIL 10 MG PO TABS
10.0000 mg | ORAL_TABLET | Freq: Every day | ORAL | 1 refills | Status: AC
  Filled 2024-10-14: qty 90, 90d supply, fill #0

## 2024-10-14 MED ORDER — ROSUVASTATIN CALCIUM 20 MG PO TABS
20.0000 mg | ORAL_TABLET | ORAL | 0 refills | Status: AC
  Filled 2024-10-14 – 2024-10-23 (×2): qty 36, 84d supply, fill #0

## 2024-10-14 MED ORDER — TESTOSTERONE CYPIONATE 200 MG/ML IM SOLN
160.0000 mg | INTRAMUSCULAR | 1 refills | Status: AC
  Filled 2024-10-14: qty 12, 84d supply, fill #0
  Filled 2024-10-22 – 2024-10-26 (×2): qty 12, 105d supply, fill #0

## 2024-10-16 ENCOUNTER — Other Ambulatory Visit (HOSPITAL_BASED_OUTPATIENT_CLINIC_OR_DEPARTMENT_OTHER): Payer: Self-pay

## 2024-10-16 ENCOUNTER — Encounter (HOSPITAL_BASED_OUTPATIENT_CLINIC_OR_DEPARTMENT_OTHER): Payer: Self-pay

## 2024-10-16 MED ORDER — METOPROLOL SUCCINATE ER 25 MG PO TB24
25.0000 mg | ORAL_TABLET | Freq: Every day | ORAL | 5 refills | Status: DC
  Filled 2024-10-16: qty 30, 30d supply, fill #0

## 2024-10-16 MED ORDER — METOPROLOL SUCCINATE ER 25 MG PO TB24
12.5000 mg | ORAL_TABLET | Freq: Every day | ORAL | 5 refills | Status: AC
  Filled 2024-10-16: qty 15, 30d supply, fill #0

## 2024-10-17 ENCOUNTER — Other Ambulatory Visit (HOSPITAL_BASED_OUTPATIENT_CLINIC_OR_DEPARTMENT_OTHER): Payer: Self-pay

## 2024-10-19 ENCOUNTER — Other Ambulatory Visit (HOSPITAL_BASED_OUTPATIENT_CLINIC_OR_DEPARTMENT_OTHER): Payer: Self-pay

## 2024-10-22 ENCOUNTER — Other Ambulatory Visit: Payer: Self-pay

## 2024-10-22 ENCOUNTER — Other Ambulatory Visit (HOSPITAL_COMMUNITY): Payer: Self-pay

## 2024-10-22 ENCOUNTER — Other Ambulatory Visit (HOSPITAL_BASED_OUTPATIENT_CLINIC_OR_DEPARTMENT_OTHER): Payer: Self-pay

## 2024-10-24 ENCOUNTER — Other Ambulatory Visit (HOSPITAL_BASED_OUTPATIENT_CLINIC_OR_DEPARTMENT_OTHER): Payer: Self-pay

## 2024-10-26 ENCOUNTER — Other Ambulatory Visit (HOSPITAL_BASED_OUTPATIENT_CLINIC_OR_DEPARTMENT_OTHER): Payer: Self-pay

## 2024-11-03 ENCOUNTER — Ambulatory Visit (HOSPITAL_BASED_OUTPATIENT_CLINIC_OR_DEPARTMENT_OTHER): Admitting: Family

## 2024-12-02 ENCOUNTER — Ambulatory Visit: Admitting: Family Medicine
